# Patient Record
Sex: Male | Born: 1950 | Race: White | Hispanic: No | Marital: Married | Smoking: Never smoker
Health system: Southern US, Community
[De-identification: ages and names within clinical notes are randomized; demographics above are authoritative.]

## PROBLEM LIST (undated history)

## (undated) DIAGNOSIS — E785 Hyperlipidemia, unspecified: Secondary | ICD-10-CM

## (undated) DIAGNOSIS — G373 Acute transverse myelitis in demyelinating disease of central nervous system: Secondary | ICD-10-CM

## (undated) DIAGNOSIS — K219 Gastro-esophageal reflux disease without esophagitis: Secondary | ICD-10-CM

## (undated) DIAGNOSIS — F419 Anxiety disorder, unspecified: Secondary | ICD-10-CM

## (undated) DIAGNOSIS — Z87898 Personal history of other specified conditions: Secondary | ICD-10-CM

## (undated) DIAGNOSIS — E119 Type 2 diabetes mellitus without complications: Secondary | ICD-10-CM

## (undated) DIAGNOSIS — E079 Disorder of thyroid, unspecified: Secondary | ICD-10-CM

## (undated) DIAGNOSIS — C801 Malignant (primary) neoplasm, unspecified: Secondary | ICD-10-CM

## (undated) DIAGNOSIS — E349 Endocrine disorder, unspecified: Secondary | ICD-10-CM

## (undated) DIAGNOSIS — R011 Cardiac murmur, unspecified: Secondary | ICD-10-CM

## (undated) DIAGNOSIS — B029 Zoster without complications: Secondary | ICD-10-CM

## (undated) DIAGNOSIS — T7840XA Allergy, unspecified, initial encounter: Secondary | ICD-10-CM

## (undated) HISTORY — DX: Type 2 diabetes mellitus without complications: E11.9

## (undated) HISTORY — DX: Malignant (primary) neoplasm, unspecified: C80.1

## (undated) HISTORY — DX: Gilbert syndrome: E80.4

## (undated) HISTORY — DX: Hyperlipidemia, unspecified: E78.5

## (undated) HISTORY — PX: ROTATOR CUFF REPAIR: SHX139

## (undated) HISTORY — DX: Endocrine disorder, unspecified: E34.9

## (undated) HISTORY — PX: SHOULDER ARTHROSCOPY: SHX128

## (undated) HISTORY — PX: COLONOSCOPY: SHX174

## (undated) HISTORY — DX: Gastro-esophageal reflux disease without esophagitis: K21.9

## (undated) HISTORY — PX: APPENDECTOMY: SHX54

## (undated) HISTORY — DX: Allergy, unspecified, initial encounter: T78.40XA

## (undated) HISTORY — PX: EYE SURGERY: SHX253

## (undated) HISTORY — DX: Disorder of thyroid, unspecified: E07.9

## (undated) HISTORY — DX: Acute transverse myelitis in demyelinating disease of central nervous system: G37.3

## (undated) HISTORY — PX: KNEE ARTHROSCOPY: SUR90

## (undated) HISTORY — DX: Zoster without complications: B02.9

## (undated) HISTORY — DX: Personal history of other specified conditions: Z87.898

## (undated) HISTORY — PX: POLYPECTOMY: SHX149

## (undated) HISTORY — PX: NOSE SURGERY: SHX723

---

## 2004-06-07 ENCOUNTER — Ambulatory Visit: Payer: Self-pay | Admitting: Internal Medicine

## 2005-02-28 ENCOUNTER — Ambulatory Visit: Payer: Self-pay | Admitting: Internal Medicine

## 2005-12-22 ENCOUNTER — Ambulatory Visit: Payer: Self-pay | Admitting: Internal Medicine

## 2007-06-19 ENCOUNTER — Ambulatory Visit: Payer: Self-pay | Admitting: Internal Medicine

## 2007-10-26 ENCOUNTER — Ambulatory Visit: Payer: Self-pay | Admitting: Internal Medicine

## 2007-10-26 DIAGNOSIS — N419 Inflammatory disease of prostate, unspecified: Secondary | ICD-10-CM | POA: Insufficient documentation

## 2007-10-26 DIAGNOSIS — E785 Hyperlipidemia, unspecified: Secondary | ICD-10-CM | POA: Insufficient documentation

## 2007-10-29 ENCOUNTER — Encounter (INDEPENDENT_AMBULATORY_CARE_PROVIDER_SITE_OTHER): Payer: Self-pay | Admitting: *Deleted

## 2007-10-29 ENCOUNTER — Encounter: Payer: Self-pay | Admitting: Internal Medicine

## 2007-11-05 ENCOUNTER — Encounter (INDEPENDENT_AMBULATORY_CARE_PROVIDER_SITE_OTHER): Payer: Self-pay | Admitting: *Deleted

## 2007-11-13 ENCOUNTER — Encounter: Payer: Self-pay | Admitting: Internal Medicine

## 2007-11-26 ENCOUNTER — Ambulatory Visit: Payer: Self-pay | Admitting: Internal Medicine

## 2007-11-26 ENCOUNTER — Encounter: Payer: Self-pay | Admitting: Internal Medicine

## 2007-11-26 ENCOUNTER — Emergency Department (HOSPITAL_COMMUNITY): Admission: EM | Admit: 2007-11-26 | Discharge: 2007-11-26 | Payer: Self-pay | Admitting: Emergency Medicine

## 2007-11-26 DIAGNOSIS — K8020 Calculus of gallbladder without cholecystitis without obstruction: Secondary | ICD-10-CM | POA: Insufficient documentation

## 2007-12-03 ENCOUNTER — Ambulatory Visit: Payer: Self-pay | Admitting: Internal Medicine

## 2007-12-05 ENCOUNTER — Telehealth: Payer: Self-pay | Admitting: Internal Medicine

## 2007-12-19 ENCOUNTER — Ambulatory Visit: Payer: Self-pay | Admitting: Internal Medicine

## 2007-12-19 ENCOUNTER — Encounter (INDEPENDENT_AMBULATORY_CARE_PROVIDER_SITE_OTHER): Payer: Self-pay | Admitting: *Deleted

## 2008-01-01 ENCOUNTER — Encounter: Payer: Self-pay | Admitting: Internal Medicine

## 2008-04-01 ENCOUNTER — Telehealth (INDEPENDENT_AMBULATORY_CARE_PROVIDER_SITE_OTHER): Payer: Self-pay | Admitting: *Deleted

## 2008-06-24 ENCOUNTER — Encounter: Payer: Self-pay | Admitting: Internal Medicine

## 2008-07-25 HISTORY — PX: CHOLECYSTECTOMY: SHX55

## 2008-07-29 ENCOUNTER — Ambulatory Visit (HOSPITAL_COMMUNITY): Admission: RE | Admit: 2008-07-29 | Discharge: 2008-07-29 | Payer: Self-pay | Admitting: General Surgery

## 2008-07-29 ENCOUNTER — Encounter (INDEPENDENT_AMBULATORY_CARE_PROVIDER_SITE_OTHER): Payer: Self-pay | Admitting: General Surgery

## 2008-08-19 ENCOUNTER — Encounter: Payer: Self-pay | Admitting: Internal Medicine

## 2008-10-28 ENCOUNTER — Encounter: Payer: Self-pay | Admitting: Internal Medicine

## 2008-10-29 ENCOUNTER — Ambulatory Visit: Payer: Self-pay | Admitting: Family Medicine

## 2008-10-30 ENCOUNTER — Encounter: Payer: Self-pay | Admitting: Internal Medicine

## 2008-11-21 ENCOUNTER — Encounter (INDEPENDENT_AMBULATORY_CARE_PROVIDER_SITE_OTHER): Payer: Self-pay | Admitting: *Deleted

## 2009-06-07 ENCOUNTER — Ambulatory Visit: Payer: Self-pay | Admitting: Family Medicine

## 2009-06-23 ENCOUNTER — Encounter (INDEPENDENT_AMBULATORY_CARE_PROVIDER_SITE_OTHER): Payer: Self-pay

## 2009-06-24 ENCOUNTER — Ambulatory Visit: Payer: Self-pay | Admitting: Internal Medicine

## 2009-07-06 ENCOUNTER — Ambulatory Visit: Payer: Self-pay | Admitting: Internal Medicine

## 2009-07-06 LAB — HM COLONOSCOPY

## 2009-07-08 ENCOUNTER — Encounter: Payer: Self-pay | Admitting: Internal Medicine

## 2009-10-06 ENCOUNTER — Telehealth: Payer: Self-pay | Admitting: Internal Medicine

## 2009-11-16 ENCOUNTER — Encounter: Payer: Self-pay | Admitting: Internal Medicine

## 2010-01-12 ENCOUNTER — Ambulatory Visit: Payer: Self-pay | Admitting: Internal Medicine

## 2010-01-12 DIAGNOSIS — B351 Tinea unguium: Secondary | ICD-10-CM | POA: Insufficient documentation

## 2010-04-05 ENCOUNTER — Ambulatory Visit: Payer: Self-pay | Admitting: Internal Medicine

## 2010-04-05 DIAGNOSIS — G4700 Insomnia, unspecified: Secondary | ICD-10-CM | POA: Insufficient documentation

## 2010-04-05 DIAGNOSIS — F411 Generalized anxiety disorder: Secondary | ICD-10-CM | POA: Insufficient documentation

## 2010-05-25 ENCOUNTER — Ambulatory Visit: Payer: Self-pay | Admitting: Internal Medicine

## 2010-05-25 DIAGNOSIS — H9319 Tinnitus, unspecified ear: Secondary | ICD-10-CM | POA: Insufficient documentation

## 2010-05-28 ENCOUNTER — Emergency Department (HOSPITAL_BASED_OUTPATIENT_CLINIC_OR_DEPARTMENT_OTHER): Admission: EM | Admit: 2010-05-28 | Discharge: 2010-05-28 | Payer: Self-pay | Admitting: Emergency Medicine

## 2010-05-28 ENCOUNTER — Ambulatory Visit: Payer: Self-pay | Admitting: Interventional Radiology

## 2010-06-01 ENCOUNTER — Encounter: Payer: Self-pay | Admitting: Internal Medicine

## 2010-06-09 ENCOUNTER — Encounter: Payer: Self-pay | Admitting: Internal Medicine

## 2010-08-22 LAB — CONVERTED CEMR LAB
ALT: 36 units/L (ref 0–53)
AST: 29 units/L (ref 0–37)
Basophils Absolute: 0 10*3/uL (ref 0.0–0.1)
Basophils Relative: 0.3 % (ref 0.0–1.0)
Bilirubin, Direct: 0.1 mg/dL (ref 0.0–0.3)
CO2: 32 meq/L (ref 19–32)
Chloride: 106 meq/L (ref 96–112)
Glucose, Bld: 112 mg/dL — ABNORMAL HIGH (ref 70–99)
Hemoglobin: 16.3 g/dL (ref 13.0–17.0)
Lymphocytes Relative: 32.2 % (ref 12.0–46.0)
MCHC: 33 g/dL (ref 30.0–36.0)
Monocytes Relative: 8 % (ref 3.0–12.0)
Neutro Abs: 3.3 10*3/uL (ref 1.4–7.7)
Neutrophils Relative %: 57.7 % (ref 43.0–77.0)
RBC: 5.33 M/uL (ref 4.22–5.81)
Sodium: 141 meq/L (ref 135–145)
Total Bilirubin: 1.2 mg/dL (ref 0.3–1.2)
Total Protein: 7.3 g/dL (ref 6.0–8.3)

## 2010-08-26 NOTE — Letter (Signed)
Summary: [facility]  [facility]   Imported By: Lanelle Bal 06/14/2010 10:36:04  _____________________________________________________________________  External Attachment:    Type:   Image     Comment:   External Document

## 2010-08-26 NOTE — Assessment & Plan Note (Signed)
Summary: TROUBLE SLEEPING/KN   Vital Signs:  Patient profile:   60 year old male Weight:      242.2 pounds Temp:     98.2 degrees F oral Pulse rate:   60 / minute Resp:     15 per minute BP sitting:   128 / 80  (left arm) Cuff size:   large  Vitals Entered By: Shonna Chock CMA (April 05, 2010 3:04 PM) CC: Trouble sleeping: Staying and Falling x several months, Insomnia   Primary Care Provider:  Marga Melnick, MD  CC:  Trouble sleeping: Staying and Falling x several months and Insomnia.  History of Present Illness: Insomnia      This is a 60 year old man who presents with Insomnia for 3 months w/o trigger.  The patient reports intermittent difficulty falling asleep, frequent awakening, early awakening, and daytime somnolence, but denies nightmares, leg movements, snoring, and apnea noted by partner. "I can't shut my mind down". Similar issues several years ago due to family stresses. Insomnia has led to problems with falling asleep at work.  Risk factors for insomnia include irregular work schedule; he works 1st & 2nd shift in [employer]. He must get up @ 4:30 am for 1st shift. Sleep is less of an issue on 2nd shift. Behaviors that may contribute to insomnia include reading in bed and OTC sleep aids (Benadryl) .  Treatments tried in the past and found to be ineffective incude Homeopathy. FH of anxiety in his mother, father & sister.His brother is an alcoholic.  Allergies: 1)  ! Codeine 2)  Codeine  Review of Systems General:  Complains of fatigue. Eyes:  Denies blurring, double vision, and vision loss-both eyes. CV:  Occasional palpitations. GI:  Denies constipation and diarrhea. Derm:  Denies changes in nail beds, dryness, and hair loss. Neuro:  Denies numbness and tingling. Psych:  Complains of anxiety; denies easily angered, easily tearful, irritability, and panic attacks. Endo:  Denies cold intolerance and heat intolerance.  Physical Exam  General:  well-nourished,in no  acute distress; alert,appropriate and cooperative throughout examination Eyes:  No corneal or conjunctival inflammation noted.Perrla. No lid lag Mouth:  Oral mucosa and oropharynx without lesions or exudates.  Teeth in good repair. Oropharynx not crowded Neck:  No deformities, masses, or tenderness noted. Heart:  regular rhythm, no murmur, no gallop, no rub, and bradycardia.   Extremities:  No clubbing, cyanosis, edema. No onycholysis . No tremor Neurologic:  alert & oriented X3 and DTRs symmetrical and normal.   No tremor Cervical Nodes:  No lymphadenopathy noted Axillary Nodes:  No palpable lymphadenopathy Psych:  memory intact for recent and remote, normally interactive, good eye contact, not anxious appearing, and not depressed appearing.     Impression & Recommendations:  Problem # 1:  INSOMNIA-SLEEP DISORDER-UNSPEC (ICD-780.52)  Problem # 2:  ANXIETY STATE, UNSPECIFIED (ICD-300.00) Pathophysiology of Neurotransmitter Deficiency discussed via . His updated medication list for this problem includes:    Doxepin Hcl 10 Mg Caps (Doxepin hcl) .Marland Kitchen... 1 at bedtime as needed  Complete Medication List: 1)  Benadryl 25 Mg Caps (Diphenhydramine hcl) .... As needed 2)  Multivitamins Caps (Multiple vitamin) .Marland Kitchen.. 1 once daily 3)  Vitamin C 250 Mg Tabs (Ascorbic acid) .... Once daily 4)  B Complex Caps (B complex vitamins) .Marland Kitchen.. 1 once daily 5)  Calcium 500/d 500-200 Mg-unit Tabs (Calcium carbonate-vitamin d) .... Once daily 6)  Apexicon 0.05 % Oint (Diflorasone diacetate) .... As directed 7)  Testim 50 Mg/5gm Gel (Testosterone) .Marland KitchenMarland KitchenMarland Kitchen  Once daily 8)  Doxepin Hcl 10 Mg Caps (Doxepin hcl) .Marland Kitchen.. 1 at bedtime as needed  Patient Instructions: 1)  Assess response to as needed at bedtime medication. Avoid  stimulants & Sleep Hygiene as discussed. Prescriptions: DOXEPIN HCL 10 MG CAPS (DOXEPIN HCL) 1 at bedtime as needed  #30 x 2   Entered and Authorized by:   Marga Melnick MD   Signed by:   Marga Melnick MD on 04/05/2010   Method used:   Print then Give to Patient   RxID:   [id number]

## 2010-08-26 NOTE — Letter (Signed)
Summary: [facility]  [facility]   Imported By: Lanelle Bal 11/23/2009 12:12:07  _____________________________________________________________________  External Attachment:    Type:   Image     Comment:   External Document

## 2010-08-26 NOTE — Progress Notes (Signed)
Summary: colon results  Phone Note Call from Patient Call back at Home Phone [phone]   Caller: Patient Call For: Leone Payor Reason for Call: Talk to Nurse Summary of Call: Patient states that he never received the colon results can we please resend it Initial call taken by: Tawni Levy,  October 06, 2009 2:11 PM  Follow-up for Phone Call        letter mailed again. Patient  aware that I have mailed  a copy of the letter and I reviewed the results with him. Follow-up by: Darcey Nora RN, CGRN,  October 06, 2009 2:24 PM

## 2010-08-26 NOTE — Assessment & Plan Note (Signed)
Summary: FU ON SKIN INFECTION //KN   Vital Signs:  Patient profile:   60 year old male Height:      75 inches Weight:      238 pounds BMI:     29.86 BSA:     2.36 Temp:     98.2 degrees F oral Pulse rate:   58 / minute Pulse rhythm:   regular Resp:     16 per minute BP sitting:   142 / 86  (left arm) Cuff size:   regular  Vitals Entered By: Ok Anis CMA (January 12, 2010 2:34 PM)  Primary Care Provider:  Marga Melnick, MD  CC:  F/u skin infection.  History of Present Illness: Onset 01/07/2010 as redness in R foot. Ringworm dxed @ UC ; Topical & pill Rxed. Sat 06/18 he had fever & sweats with  swelling over R foot @ level of malleoli with bright red rash. He saw Dr Yetta Barre 06/20 ;? bug bite dxed & Doxycycline & Pen VK & Apexicon topically. He works as Administrator. No PMH of MRSA  Current Medications (verified): 1)  Benadryl 25 Mg  Caps (Diphenhydramine Hcl) .... As Needed 2)  Multivitamins   Caps (Multiple Vitamin) .Marland Kitchen.. 1 Once Daily 3)  Vitamin C 250 Mg  Tabs (Ascorbic Acid) .... Once Daily 4)  B Complex   Caps (B Complex Vitamins) .Marland Kitchen.. 1 Once Daily 5)  Calcium 500/d 500-200 Mg-Unit  Tabs (Calcium Carbonate-Vitamin D) .... Once Daily 6)  Penicillin V Potassium 500 Mg Tabs (Penicillin V Potassium) .... One Tablet By Mouth Three Times A Day X 14 Days 7)  Doxycycline Hyclate 100 Mg Tabs (Doxycycline Hyclate) .... One Tablet Two Times A Day 8)  Apexicon 0.05 % Oint (Diflorasone Diacetate) .... As Directed  Allergies (verified): 1)  ! Codeine 2)  Codeine  Review of Systems General:  Denies chills, fever, and sweats. Derm:  Complains of changes in color of skin; denies itching.  Physical Exam  General:  well-nourished,in no acute distress; alert,appropriate and cooperative throughout examination Pulses:  R femoral,dorsalis pedis and posterior tibial pulses are full and equal bilaterally Extremities:  No clubbing, cyanosis, edema. Mild fungal nail changes  Skin:  7.5 x 5 cm  deep violaceous lesion R Achilles tendon area w/o increased temp or tenderness; localized swlling Inguinal Nodes:  No significant adenopathy   Impression & Recommendations:  Problem # 1:  CELLULITIS (ICD-682.9) ? from bugbite or tick The following medications were removed from the medication list:    Azithromycin 250 Mg Tabs (Azithromycin) .Marland Kitchen..Marland Kitchen Two tabs by mouth on day 1, then 1 tab daily on days 2 through 5 His updated medication list for this problem includes:    Penicillin V Potassium 500 Mg Tabs (Penicillin v potassium) ..... One tablet by mouth three times a day x 14 days    Doxycycline Hyclate 100 Mg Tabs (Doxycycline hyclate) ..... One tablet two times a day  Problem # 2:  ONYCHOMYCOSIS, TOENAILS (ICD-110.1)  Complete Medication List: 1)  Benadryl 25 Mg Caps (Diphenhydramine hcl) .... As needed 2)  Multivitamins Caps (Multiple vitamin) .Marland Kitchen.. 1 once daily 3)  Vitamin C 250 Mg Tabs (Ascorbic acid) .... Once daily 4)  B Complex Caps (B complex vitamins) .Marland Kitchen.. 1 once daily 5)  Calcium 500/d 500-200 Mg-unit Tabs (Calcium carbonate-vitamin d) .... Once daily 6)  Penicillin V Potassium 500 Mg Tabs (Penicillin v potassium) .... One tablet by mouth three times a day x 14 days 7)  Doxycycline Hyclate 100  Mg Tabs (Doxycycline hyclate) .... One tablet two times a day 8)  Apexicon 0.05 % Oint (Diflorasone diacetate) .... As directed  Patient Instructions: 1)  Finish full course of antibiotics; avoid direct sun. Flip flops @  gym. Report Warning Signs as discussed. Saline wet to dry three times a day to rash. Align once daily for loose stool

## 2010-08-26 NOTE — Therapy (Signed)
[facility]   Imported By: Lanelle Bal 06/21/2010 13:53:05  _____________________________________________________________________  External Attachment:    Type:   Image     Comment:   External Document

## 2010-08-26 NOTE — Assessment & Plan Note (Signed)
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  History of Present Illness: Tinnitus chronically for 5-6 years; progressive in past 1 year. PMH of temporary worsening with cerumen impactions in HS. He protects ears from excess noise; he is on 81 mg ASA daily.He swimms 3-4 X /week ; he does not use plugs. Slight hearing loss 5 yrs ago on exit CPX from [place].  Current Medications (verified): 1)  Benadryl 25 Mg  Caps (Diphenhydramine Hcl) .... As Needed 2)  Multivitamins   Caps (Multiple Vitamin) .Marland Kitchen.. 1 Once Daily 3)  Vitamin C 250 Mg  Tabs (Ascorbic Acid) .... Once Daily 4)  B Complex   Caps (B Complex Vitamins) .Marland Kitchen.. 1 Once Daily 5)  Calcium 500/d 500-200 Mg-Unit  Tabs (Calcium Carbonate-Vitamin D) .... Once Daily 6)  Apexicon 0.05 % Oint (Diflorasone Diacetate) .... As Directed 7)  Testim 50 Mg/5gm Gel (Testosterone) .... Once Daily 8)  Doxepin Hcl 10 Mg Caps (Doxepin Hcl) .Marland Kitchen.. 1 At Bedtime As Needed  Allergies: 1)  ! Codeine 2)  Codeine  Review of Systems General:  Denies chills, fever, and sweats. ENT:  Complains of decreased hearing and earache; denies ear discharge and nasal congestion; No URI symptoms . Neuro:  Denies headaches, numbness, and tingling.  Physical Exam  General:  well-nourished,in no acute distress; alert,appropriate and cooperative throughout examination Ears:  External ear exam shows no significant lesions or deformities.  Otoscopic examination reveals clear canals, tympanic membranes are intact bilaterally  without bulging, retraction, inflammation or discharge. Hearing is grossly normal bilaterally.  Tuning fork exam normal.MINIMAL wax on R. Whisper heard @ 6 ft. Nose:  External nasal examination shows no deformity or inflammation. Nasal mucosa are pink and moist without lesions or exudates. Mouth:  Oral mucosa and oropharynx without lesions or exudates.  Teeth in good repair. Cervical Nodes:  No lymphadenopathy noted Axillary Nodes:  No palpable lymphadenopathy   Impression & Recommendations:  Problem # 1:  TINNITUS (ICD-388.30)  Orders: Audiology (Audio)  Complete Medication List: 1)  Benadryl 25 Mg Caps (Diphenhydramine hcl) .... As needed 2)  Multivitamins Caps (Multiple vitamin) .Marland Kitchen.. 1 once daily 3)  Vitamin C 250 Mg Tabs (Ascorbic acid) .... Once daily 4)  B Complex Caps (B complex vitamins) .Marland Kitchen.. 1 once daily 5)  Calcium 500/d 500-200 Mg-unit Tabs (Calcium carbonate-vitamin d) .... Once daily 6)  Apexicon 0.05 % Oint (Diflorasone diacetate) .... As directed 7)  Testim 50 Mg/5gm Gel (Testosterone) .... Once daily 8)  Doxepin Hcl 10 Mg Caps (Doxepin hcl) .Marland Kitchen.. 1 at bedtime as needed  Patient Instructions: 1)  Audiology referral will be made.   Orders Added: 1)  Est. Patient Level III [16109] 2)  Audiology [Audio]

## 2010-10-05 LAB — CBC
HCT: 47.4 % (ref 39.0–52.0)
Hemoglobin: 16.6 g/dL (ref 13.0–17.0)
MCV: 90 fL (ref 78.0–100.0)
Platelets: 155 10*3/uL (ref 150–400)
RBC: 5.27 MIL/uL (ref 4.22–5.81)
WBC: 6.5 10*3/uL (ref 4.0–10.5)

## 2010-10-05 LAB — DIFFERENTIAL
Eosinophils Relative: 1 % (ref 0–5)
Lymphocytes Relative: 24 % (ref 12–46)
Lymphs Abs: 1.6 10*3/uL (ref 0.7–4.0)
Monocytes Relative: 7 % (ref 3–12)

## 2010-10-05 LAB — BASIC METABOLIC PANEL
Chloride: 103 mEq/L (ref 96–112)
Creatinine, Ser: 0.9 mg/dL (ref 0.4–1.5)
GFR calc Af Amer: >60 mL/min (ref >60)
Potassium: 4.3 mEq/L (ref 3.5–5.1)
Sodium: 143 mEq/L (ref 135–145)

## 2010-10-05 LAB — URINALYSIS, ROUTINE W REFLEX MICROSCOPIC
Glucose, UA: NEGATIVE mg/dL
Hgb urine dipstick: NEGATIVE
Specific Gravity, Urine: 1.014 (ref 1.005–1.030)
pH: 5.5 (ref 5.0–8.0)

## 2010-11-09 ENCOUNTER — Telehealth: Payer: Self-pay | Admitting: Internal Medicine

## 2010-11-09 NOTE — Telephone Encounter (Signed)
Patient has CPX on 6/29, has labs for 6/22---what orders and codes do you want to use??    thanks

## 2010-11-09 NOTE — Telephone Encounter (Signed)
Lipid,Hep, BMP,CBCD, TSH,PSA,Stool Cards,Udip v70.0/601.9/272.4

## 2010-11-09 NOTE — Telephone Encounter (Signed)
Added laab info to 6/22

## 2010-11-24 ENCOUNTER — Ambulatory Visit (INDEPENDENT_AMBULATORY_CARE_PROVIDER_SITE_OTHER): Payer: 59 | Admitting: Internal Medicine

## 2010-11-24 ENCOUNTER — Encounter: Payer: Self-pay | Admitting: Internal Medicine

## 2010-11-24 DIAGNOSIS — R030 Elevated blood-pressure reading, without diagnosis of hypertension: Secondary | ICD-10-CM

## 2010-11-24 DIAGNOSIS — R040 Epistaxis: Secondary | ICD-10-CM

## 2010-11-24 DIAGNOSIS — F411 Generalized anxiety disorder: Secondary | ICD-10-CM

## 2010-11-24 DIAGNOSIS — F519 Sleep disorder not due to a substance or known physiological condition, unspecified: Secondary | ICD-10-CM

## 2010-11-24 MED ORDER — FLUOXETINE HCL 20 MG PO CAPS: 20.0000 mg | ORAL_CAPSULE | Freq: Every day | ORAL | Status: AC

## 2010-11-24 MED ORDER — LISINOPRIL 20 MG PO TABS: 20.0000 mg | ORAL_TABLET | Freq: Every day | ORAL | Status: DC

## 2010-11-24 NOTE — Progress Notes (Signed)
  Subjective:    Patient ID: Aaron Bradshaw, male    DOB: [birth year], 60 y.o.   MRN: [id number]  HPI He actually has several concerns. He had epistaxis from the left nare yesterday. The EMS recorded his blood pressure @ 168/88. He denies any other bleeding dyscrasias such as hemoptysis, melena, rectal bleeding, hematuria, or bleeding or clotting issues. He has been using the agent "Snore Stop" intranasally & questions whether this caused the epistaxis.  He has been having a flare of his chronic insomnia over  the last 3 weeks. He denies the use of stimulants or decongestants. He has been using Benadryl with some response. In the past Doxepin  was also beneficial  He feels his anxiety has increased in the last several weeks as well without any specific trigger. He states he has had panic attacks in the past. This time he denies panic attacks, irritability or severe depression. His father & mother  did have depression.      Review of Systems The major and minor symptoms of rhinosinusitis were absent: nasal congestion/obstruction; nasal purulence; facial pain; anosmia; fatigue; fever; headache; halitosis; earache and dental pain.    Objective:   Physical Exam Gen.: Healthy and well-nourished in appearance. Alert, appropriate and cooperative throughout exam. Eyes: No corneal or conjunctival inflammation noted. Ears: External  ear exam reveals no significant lesions or deformities. Canals clear .TMs normal. Nose: External nasal exam reveals no deformity or inflammation. Nasal mucosa are  dry. No lesions or exudates noted.  Mouth: Oral mucosa and oropharynx reveal no lesions or exudates. Teeth in good repair. Neck: No deformities, masses, or tenderness noted. . Thyroid  normal. Lungs: Normal respiratory effort; chest expands symmetrically. Lungs are clear to auscultation without rales, wheezes, or increased work of breathing. Heart: Normal rate and rhythm. Normal S1 and S2. No gallop, click, or  rub. No murmur. Musculoskeletal/extremities: No clubbing, cyanosis, edema, or deformity noted.  Nail health  Good w/o onycholysis. Vascular: Carotid, radial artery, dorsalis pedis and dorsalis posterior tibial pulses are full and equal. No bruits present. Neurologic: Alert and oriented x3. Deep tendon reflexes symmetrical and normal. No tremor. Skin: Intact without suspicious lesions or rashes. Lymph: No cervical, axillary  lymphadenopathy present. Psych: Mood and affect are slightly flat but he is open &  interactive                                                                                         Assessment & Plan:  #1 epistaxis   #2 hypertension   #3 sleep disorder, exacerbation of  chronic issue   #4 anxiety    Plan : TSH will be ordered; clinically hyperthyroidism is not present  The doxepin will be used as needed at night. Sleep hygiene was discussed  Fluoxetine 20 mg was prescribed; the pathophysiology of serotonin deficiency was discussed  Blood pressure will be monitored; if it averages over 135/85, lisinopril 20 mg daily will be initiated

## 2010-11-24 NOTE — Patient Instructions (Addendum)
Fill the prescription for the lisinopril if your blood pressure averages greater than 135/85. Use the Doxepin as needed @ night  if Fluoxetine does not control symptoms.

## 2010-12-02 ENCOUNTER — Telehealth: Payer: Self-pay | Admitting: *Deleted

## 2010-12-02 NOTE — Telephone Encounter (Signed)
Spoke w/ pt mentioned same symptoms as below would like to know what to do.

## 2010-12-02 NOTE — Telephone Encounter (Signed)
The fluoxetine can be stopped; please call if the symptoms fail to resolve off the fluoxetine. If her symptoms persisted I would want to collect 24-hour urine for catecholamines and metanephrines, the stress hormones.

## 2010-12-02 NOTE — Telephone Encounter (Signed)
[facility] Triage Call Report Triage Record Num: [id number] Operator: Ethlyn Gallery Patient Name: Aaron Bradshaw Call Date & Time: 12/01/2010 10:11:33PM Patient Phone: [phone] PCP: Marga Melnick Patient Gender: Male PCP Fax : [phone] Patient DOB: 1951/05/20 Practice Name: [facility] Reason for Call: [name]/pt called and stated he has been on Fuloxetine 20mg  po qd since 11/27/02/12. He states he is experiencing restless, increased agitation, thirst, frequency of urination, feeling hot/cold, difficulty sleeping, and tightness inhis chest. He is asking if he should stop taking the mediction. Advised him to talk with his PCP before stopping medication and that he should not stop it suddenly without talking to his doctor. Advised him to take Doxepin as prescribed. Protocol(s) Used: Office Note Recommended Outcome per Protocol: Information Noted and Sent to Office Reason for Outcome: Caller information to office

## 2010-12-02 NOTE — Telephone Encounter (Signed)
Left detailed msg on pt voicemail w/ information.

## 2010-12-07 NOTE — Op Note (Signed)
NAME:  Aaron Bradshaw, Aaron Bradshaw           ACCOUNT NO.:  [id number]   MEDICAL RECORD NO.:  [id number]          PATIENT TYPE:  AMB   LOCATION:  SDS                          FACILITY:  [facility]   PHYSICIAN:  Cherylynn Ridges, M.D.    DATE OF BIRTH:  Nov 04, 1950   DATE OF PROCEDURE:  07/29/2008  DATE OF DISCHARGE:  07/29/2008                               OPERATIVE REPORT   Patient of Dr. Alwyn Ren.   PREOPERATIVE DIAGNOSIS:  Symptomatic cholelithiasis.   POSTOPERATIVE DIAGNOSIS:  Symptomatic cholelithiasis.   PROCEDURE:  Laparoscopic cholecystectomy with cholangiogram.   SURGEON:  Marta Lamas. Lindie Spruce, MD   ANESTHESIA:  General endotracheal.   ESTIMATED BLOOD LOSS:  Less than 20 mL.   COMPLICATIONS:  None.   CONDITION:  Stable.   FINDINGS:  No acute inflammation.  Normal cholangiogram with good flow  into the duodenum.  No ductal dilatation.  No intraductal filling  defects.  Good proximal filling.   INDICATIONS FOR OPERATION:  The patient is a 60 year old with  symptomatic gallstones who comes in now for an elective laparoscopic  cholecystectomy.   OPERATION:  The patient was taken to the operating room, placed on the  table in supine position.  After an adequate general endotracheal  anesthetic was administered, he was prepped and draped in the usual  sterile manner exposing in midline and right upper quadrant.   A supraumbilical midline incision was made approximately 1.5 cm long  down to the subcutaneous tissue with a #15 blade.  At the midline  fascia, an incision was made in the fascia with a #15 blade and we  grabbed the edges of the fascia with Kocher clamps while tenting up on  the fascia bluntly dissected down into the peritoneal cavity with a  Kelly clamp.  Once this was done, we were able to enter the peritoneal  cavity bluntly.  We passed a pursestring suture of 0 Vicryl around the  fascial opening then passed a Hasson cannula into the peritoneal cavity  with minimal difficulty.   We secured in place with the pursestring  suture, which was in place and then insufflated carbon dioxide gas up to  a maximal intra-abdominal pressure of 50 mmHg.   Two right costal margin 5-mm cannulas and a subxiphoid 11/12-mm cannula  passed under direct vision.  The patient was placed in reverse  Trendelenburg and the left side was tilted down then we began the  dissection.   We grabbed the dome of the gallbladder with a ratcheted grasper through  the lateral-most 5-mm cannula and then a second one was placed on the  infundibulum.  We dissected out the peritoneum overlying the triangle of  Calot and hepatoduodenal triangle, isolating both the cystic duct and  the cystic artery.  The cystic artery was doubly clipped proximally and  distally and then transected.  A clip was placed on the gallbladder side  and cystic duct that cholecystoducotomy was made through which a Cook  catheter which had been passed through the anterior abdominal wall was  passed in order to perform a cholangiogram.  A cholangiogram showed good  flow into the  duodenum, no intraductal filling defects, no ductal  dilatation, good proximal filling.   Once cholangiogram was completed, we removed the clip securing the  catheter in place, endoclipped the distal cystic duct x3, then  transected the cystic duct.  We then dissected out the gallbladder from  its bed actually puncturing the gallbladder once or twice, spilling  clear bile.  We irrigated all this out with a liter of saline.   We dissected out the gallbladder from its bed with minimal difficulty.  Then, we removed it from the supraumbilical site using an EndoCatch bag.  All needle counts, sponge counts, and instrument counts were correct at  that point.  We irrigated and aspirated.  Electrocautery was used to  obtain hemostasis.  We aspirated all fluid and gas from the peritoneal  cavity as all cannulas were removed.   The supraumbilical fascial site was  closed using a pursestring suture,  which was in place.  We injected 0.25% Marcaine with epi at all sites,  and we closed supraumbilical and subxiphoid skin sites using running  subcuticular stitch of 4-0 Monocryl.  The lateral cannula site was  closed with Dermabond only.  Dermabond, Steri-Strips, and Tegaderm was  used to complete the dressing.  All needle counts, sponge counts, and  instrument counts were correct.      Cherylynn Ridges, M.D.  Electronically Signed     JOW/MEDQ  D:  07/29/2008  T:  07/29/2008  Job:  [id number]   cc:   Dr. Alwyn Ren.

## 2010-12-24 DIAGNOSIS — E119 Type 2 diabetes mellitus without complications: Secondary | ICD-10-CM

## 2010-12-24 HISTORY — DX: Type 2 diabetes mellitus without complications: E11.9

## 2011-01-13 ENCOUNTER — Other Ambulatory Visit: Payer: Self-pay | Admitting: Internal Medicine

## 2011-01-13 DIAGNOSIS — Z0000 Encounter for general adult medical examination without abnormal findings: Secondary | ICD-10-CM

## 2011-01-14 ENCOUNTER — Other Ambulatory Visit (INDEPENDENT_AMBULATORY_CARE_PROVIDER_SITE_OTHER): Payer: 59

## 2011-01-14 DIAGNOSIS — Z0000 Encounter for general adult medical examination without abnormal findings: Secondary | ICD-10-CM

## 2011-01-14 LAB — BASIC METABOLIC PANEL
CO2: 29 mEq/L (ref 19–32)
Calcium: 9 mg/dL (ref 8.4–10.5)
GFR: 88.32 mL/min (ref >60.00)
Sodium: 135 mEq/L (ref 135–145)

## 2011-01-14 LAB — CBC WITH DIFFERENTIAL/PLATELET
Basophils Absolute: 0 K/uL (ref 0.0–0.1)
Basophils Relative: 0.6 % (ref 0.0–3.0)
Eosinophils Absolute: 0.2 K/uL (ref 0.0–0.7)
Eosinophils Relative: 2.7 % (ref 0.0–5.0)
HCT: 46.8 % (ref 39.0–52.0)
Hemoglobin: 16.2 g/dL (ref 13.0–17.0)
Lymphocytes Relative: 29.6 % (ref 12.0–46.0)
Lymphs Abs: 1.8 K/uL (ref 0.7–4.0)
MCHC: 34.7 g/dL (ref 30.0–36.0)
MCV: 92.4 fl (ref 78.0–100.0)
Monocytes Absolute: 0.6 K/uL (ref 0.1–1.0)
Monocytes Relative: 9.2 % (ref 3.0–12.0)
Neutro Abs: 3.5 K/uL (ref 1.4–7.7)
Neutrophils Relative %: 57.9 % (ref 43.0–77.0)
Platelets: 168 K/uL (ref 150.0–400.0)
RBC: 5.06 Mil/uL (ref 4.22–5.81)
RDW: 12.8 % (ref 11.5–14.6)
WBC: 6 K/uL (ref 4.5–10.5)

## 2011-01-14 LAB — POCT URINALYSIS DIPSTICK
Ketones, UA: NEGATIVE
Protein, UA: NEGATIVE
Spec Grav, UA: 1.01
Urobilinogen, UA: 0.2
pH, UA: 5

## 2011-01-14 LAB — LIPID PANEL
HDL: 33.5 mg/dL — ABNORMAL LOW (ref >39.00)
Total CHOL/HDL Ratio: 6
Triglycerides: 153 mg/dL — ABNORMAL HIGH (ref 0.0–149.0)

## 2011-01-14 LAB — HEPATIC FUNCTION PANEL
ALT: 31 U/L (ref 0–53)
AST: 25 U/L (ref 0–37)
Albumin: 4.3 g/dL (ref 3.5–5.2)

## 2011-01-14 NOTE — Progress Notes (Signed)
Labs only

## 2011-01-21 ENCOUNTER — Ambulatory Visit (INDEPENDENT_AMBULATORY_CARE_PROVIDER_SITE_OTHER): Payer: 59 | Admitting: Internal Medicine

## 2011-01-21 ENCOUNTER — Encounter: Payer: Self-pay | Admitting: Internal Medicine

## 2011-01-21 VITALS — BP 130/82 | HR 61 | Temp 98.3°F | Resp 14 | Ht 75.25 in | Wt 225.0 lb

## 2011-01-21 DIAGNOSIS — Z0000 Encounter for general adult medical examination without abnormal findings: Secondary | ICD-10-CM

## 2011-01-21 DIAGNOSIS — R030 Elevated blood-pressure reading, without diagnosis of hypertension: Secondary | ICD-10-CM

## 2011-01-21 DIAGNOSIS — Z136 Encounter for screening for cardiovascular disorders: Secondary | ICD-10-CM

## 2011-01-21 DIAGNOSIS — IMO0001 Reserved for inherently not codable concepts without codable children: Secondary | ICD-10-CM

## 2011-01-21 MED ORDER — GLIMEPIRIDE 2 MG PO TABS: 2.0000 mg | ORAL_TABLET | Freq: Every day | ORAL | Status: DC

## 2011-01-21 MED ORDER — METFORMIN HCL ER 500 MG PO TB24: 500.0000 mg | ORAL_TABLET | Freq: Two times a day (BID) | ORAL | Status: DC

## 2011-01-21 NOTE — Progress Notes (Signed)
Subjective:    Patient ID: Aaron Bradshaw, male    DOB: 09/04/1950, 60 y.o.   MRN: [id number]  HPI  Aaron Bradshaw  is here for a physical; his pre appointment  labs reveal uncontrolled Diabetes.      Review of Systems Patient reports no significant   vision/ hearing changes,anorexia,  fever ,adenopathy, persistant / recurrent hoarseness, swallowing issues,  edema,persistant / recurrent cough, hemoptysis, dyspnea(rest, exertional, paroxysmal nocturnal), gastrointestinal  bleeding (melena, rectal bleeding), abdominal pain, excessive heart burn, GU symptoms( dysuria, hematuria, pyuria, voiding/incontinence  issues) syncope, focal weakness, memory loss, skin/hair/nail changes,depression, anxiety, abnormal bruising/bleeding, or musculoskeletal symptoms/signs.   Diabetes                                                     Excess thirst :no;  Excess hunger:  no ;  Excess urination:  Only with stress.                                  Lightheadedness with standing:  no. Chest pain:  no ; Palpitations :occasionally with stress ;  Pain in  calves with walking:  no .                                                                                                                                 Non healing skin  ulcers or sores,especially over the feet:  no. Numbness or tingling or burning in feet : only Plantar Fascitis. Some numbness in toes after prolonged standing or walking for prolonged periods. .                                                                                                                                              Significant change in  Weight : down 15# with improved diet. Vision changes : no  .  Exercise :weights & cardiovascular 4-5x/week for 45-60 min . Nutrition/diet:  Aaron Bradshaw 's program,Eat for Health. . Eye exam : last 4 years. Foot care : Podiatrist treated Plantar Fasciitis.  A1c:7.1%       Objective:   Physical Exam Gen.: Healthy and well-nourished in appearance. Alert, appropriate and cooperative throughout exam. Head: Normocephalic without obvious abnormalities;  pattern alopecia  Eyes: No corneal or conjunctival inflammation noted. Pupils equal round reactive to light and accommodation. Fundal exam is benign without hemorrhages, exudate, papilledema. Extraocular motion intact. Vision grossly normal. Ears: External  ear exam reveals no significant lesions or deformities. Canals clear .TMs normal. Hearing is grossly normal bilaterally. Nose: External nasal exam reveals no deformity or inflammation. Nasal mucosa are pink and moist. No lesions or exudates noted. Septum without deviation; minor dislocation Mouth: Oral mucosa and oropharynx reveal no lesions or exudates. Teeth in good repair. Neck: No deformities, masses, or tenderness noted. Range of motion &. Thyroid normal. Lungs: Normal respiratory effort; chest expands symmetrically. Lungs are clear to auscultation without rales, wheezes, or increased work of breathing. Heart: Normal rate and rhythm. Normal S1 and S2. No gallop, click, or rub. Grade 1/2-1 systolic  murmur. Abdomen: Bowel sounds normal; abdomen soft and nontender. No masses, organomegaly or hernias noted. Genitalia: as per Aaron Bradshaw                                                                                      Musculoskeletal/extremities: No deformity or scoliosis noted of  the thoracic or lumbar spine but asymmetry of thoracic muscles. No clubbing, cyanosis, edema, or deformity noted. Range of motion  normal .Tone & strength  normal.Joints normal. Nail health  Good except for fungal changes R great toe nail. Vascular: Carotid, radial artery, dorsalis pedis and dorsalis posterior tibial pulses are full and equal. No bruits present. Neurologic: Alert and oriented x3. Deep tendon reflexes symmetrical and normal.          Skin: Intact without suspicious lesions  or rashes. Sun burned Lymph: No cervical or axillary lymphadenopathy present. Psych: Mood and affect are normal. Normally interactive                                                                                    Assessment & Plan:  #1 comprehensive physical exam; no acute findings #2 see Problem List with Assessments & Recommendations #3 diabetes mellitus, uncontrolled Plan: see Orders

## 2011-01-21 NOTE — Patient Instructions (Signed)
Preventive Health Care: Exercise at least 30-45 minutes a day,  3-4 days a week.  Eat a low-fat diet with lots of fruits and vegetables, up to 7-9 servings per day. Consume less than 40 grams of sugar per day from foods & drinks with High Fructose Corn Sugar as #1,2,3 or # 4 on label. Consider The [employer]usters program. Please  schedule fasting Labs in 10 weeks :Lipids, BUN,creat, A1c & urine microalbumin ( 250.02)

## 2011-02-03 ENCOUNTER — Encounter: Payer: Self-pay | Admitting: Internal Medicine

## 2011-02-03 ENCOUNTER — Ambulatory Visit (INDEPENDENT_AMBULATORY_CARE_PROVIDER_SITE_OTHER): Payer: 59 | Admitting: Internal Medicine

## 2011-02-03 DIAGNOSIS — F411 Generalized anxiety disorder: Secondary | ICD-10-CM

## 2011-02-03 DIAGNOSIS — E119 Type 2 diabetes mellitus without complications: Secondary | ICD-10-CM

## 2011-02-03 DIAGNOSIS — E785 Hyperlipidemia, unspecified: Secondary | ICD-10-CM

## 2011-02-03 MED ORDER — LORAZEPAM 0.5 MG PO TABS: 0.5000 mg | ORAL_TABLET | Freq: Three times a day (TID) | ORAL | Status: DC | PRN

## 2011-02-03 MED ORDER — GLUCOSE BLOOD VI STRP: ORAL_STRIP | Status: DC

## 2011-02-03 NOTE — Patient Instructions (Addendum)
Decrease Glimiperide to 2 mg one half pill daily. Please  schedule fasting Labs : BUN, creat,Lipids,A1c, & urine microalbumin in 6 weeks

## 2011-02-03 NOTE — Progress Notes (Signed)
Subjective:    Patient ID: Aaron Bradshaw, male    DOB: May 09, 1951, 60 y.o.   MRN: [id number]  HPI Diabetes status assessment: Fasting or morning glucose range:  105- 152 or average :  No average  . Highest glucose 2 hours after any meal:  < 150. Hypoglycemia :  "weak" with value of 80 .                                                     Excess thirst :no;  Excess hunger:  no ;  Excess urination:  no.                                  Lightheadedness with standing:  no. Chest pain:  no ; Palpitations :no ;  Pain in  calves with walking:  no .                                                                                                                                 Non healing skin  ulcers or sores,especially over the feet:  no. Numbness or tingling or burning in feet : some numbness in feet, ? From plantar fasciitis.                                                                                                                                              Significant change in  Weight : no. Vision changes : no  .                                                                    Exercise : landscaping . Nutrition/diet:  HFCS eliminated. Medication compliance : yes. Medication adverse  Effects:  Metformin "decreased energy"; better since change to lunch schedule . Eye exam : 2009. Foot care : yes  For plantar fasciitis.  A1c/ urine microalbumin monitor:  7.1%      Review of Systems He has intermittent marked anxiety. Fluoxetine had heightened this in past. Doxepin taken  @ bedtime for chronic sleep issues   with some response.      Objective:   Physical Exam Gen.: Healthy and well-nourished in appearance. Alert, appropriate and cooperative throughout exam. Lungs: Normal respiratory effort; chest expands symmetrically. Lungs are clear to auscultation without rales, wheezes, or increased work of breathing. Heart: Normal rate and rhythm. Normal S1 and S2. No gallop, click, or rub.  S4 with slurring  w/o murmur.                                                                                  Musculoskeletal/extremities: No clubbing, cyanosis, edema, or deformity noted. Nail health  good. Vascular: Carotid, radial artery, dorsalis pedis and  posterior tibial pulses are full and equal. No bruits present. Neurologic: Alert and oriented x3. Deep tendon reflexes symmetrical and normal. Light touch normal over feet.  Skin: Intact without suspicious lesions or rashes. Psych: somewhat anxious above Diabetes & "nerves" . Very focused on interventions for Diabetes; questions need for Endocrinology referral        Assessment & Plan:  #1 diabetes;: Glucose recordings are extremely good and well within range. He has had isolated hypoglycemia. This is most likely related to his high level of exercise (30-60 minutes daily) and  Glimiperide.  #2 dyslipidemia; lipids will be monitored, LDL goal will be less than 100. He will be asked to review Dr. Elige Ko book Eat, Drink, and Be Healthy for dietary cholesterol information. Referral will be made to a nutritionist for #1 & 2  #3 anxiety  #4 chronic sleep disorder.  Plan:Fasting lipids and A1c will be checked in 6 weeks as he is very concerned about Diabetes control (Note: I'm concerned about "over control " with hypoglycemic episodes as he has been so adherent to lifestyle interventions).Decrease Glimiperde.  Lorazepam 0.5 mg as needed

## 2011-02-04 NOTE — Progress Notes (Signed)
Addended by: Edgardo Roys on: 02/04/2011 08:19 AM   Modules accepted: Orders

## 2011-02-21 ENCOUNTER — Encounter: Payer: 59 | Attending: Internal Medicine

## 2011-02-21 DIAGNOSIS — Z713 Dietary counseling and surveillance: Secondary | ICD-10-CM | POA: Insufficient documentation

## 2011-02-21 DIAGNOSIS — E119 Type 2 diabetes mellitus without complications: Secondary | ICD-10-CM | POA: Insufficient documentation

## 2011-02-21 NOTE — Progress Notes (Signed)
   Patient was seen on 02/21/2011 for the first of a series of three diabetes self-management courses at the [facility]. The following learning objectives were met by the patient during this course:   Defines diabetes and the role of insulin  Identifies type of diabetes and pathophysiology  States normal BG range and personal goals  Identifies three risk factors for the development of diabetes  States the need for and frequency of healthcare follow up (ADA Standards of Care)   Lab Results  Component Value Date   HGBA1C 7.1* 01/14/2011     Patient has established the following initial goals:  Increase exercise  Work on managing stress levels  Follow Diabetes Meal Plan  Follow-Up Plan: Attend Core Diabetes Classes @ [facility] in August

## 2011-03-10 ENCOUNTER — Encounter: Payer: 59 | Attending: Internal Medicine

## 2011-03-10 DIAGNOSIS — Z713 Dietary counseling and surveillance: Secondary | ICD-10-CM | POA: Insufficient documentation

## 2011-03-10 DIAGNOSIS — E119 Type 2 diabetes mellitus without complications: Secondary | ICD-10-CM | POA: Insufficient documentation

## 2011-03-16 NOTE — Progress Notes (Signed)
  Patient was seen on 03/10/2011 for the second of a series of three diabetes self-management courses at the [facility]. The following learning objectives were met by the patient during this course:   States the relationship of exercise to blood glucose  States benefits/barriers of regular and safe exercise  States three guidelines for safe and effective exercise  Describes personal diabetes medicine regimen  Describes actions of own medications  Describes causes, symptoms, and treatment of hypo/hyperglycemia  Describes sick day rules  Identifies when to test urine for ketones when appropriate  Identifies when to call healthcare provider for acute complications  States the risk for problems with foot, skin, and dental care  States preventative foot, skin, and dental care measures  States when to call healthcare provider regarding foot, skin, and dental care  Identifies methods for evaluation of diabetes control  Discusses benefits of SBGM  Identifies relationship between nutrition, exercise, medication, and glucose levels  Discusses the importance of record keeping  Follow-Up Plan: Patient will attend the final class of the ADA Diabetes Self-Care Education. Patient received [facility] CORE Program Notebook

## 2011-03-17 ENCOUNTER — Encounter: Payer: 59 | Admitting: Dietician

## 2011-03-17 NOTE — Progress Notes (Signed)
  Patient was seen on 03/17/2011 for the third of a series of three diabetes self-management courses at the [facility]. The following learning objectives were met by the patient during this course:   Identifies nutrient effects on glycemia  States the general guidelines of meal planning  Relates understanding of personal meal plan  Describes situations that cause stress and discuss methods of stress management  Identifies lifestyle behaviors for change  The following handouts were given in class:  [employer] Carbohydrate Counting book  3 Month Follow Up Visit handout  Goal setting handout  Class evaluation form  Your patient has established the following 3 month goals for diabetes self-care:  Eat meals on time  Walk/bike/Swim /Aerobic exercise for 60 minutes 5 times per week.  Always carry a quick-acting form of sugar such as glucose pills.  Follow-Up Plan: Patient will attend a 3 month follow-up visit for diabetes self-management education.

## 2011-03-18 ENCOUNTER — Other Ambulatory Visit (INDEPENDENT_AMBULATORY_CARE_PROVIDER_SITE_OTHER): Payer: 59

## 2011-03-18 DIAGNOSIS — T887XXA Unspecified adverse effect of drug or medicament, initial encounter: Secondary | ICD-10-CM

## 2011-03-18 DIAGNOSIS — I10 Essential (primary) hypertension: Secondary | ICD-10-CM

## 2011-03-18 DIAGNOSIS — E785 Hyperlipidemia, unspecified: Secondary | ICD-10-CM

## 2011-03-18 LAB — LIPID PANEL
Cholesterol: 154 mg/dL (ref 0–200)
HDL: 35.2 mg/dL — ABNORMAL LOW (ref >39.00)
LDL Cholesterol: 97 mg/dL (ref 0–99)
Total CHOL/HDL Ratio: 4
Triglycerides: 108 mg/dL (ref 0.0–149.0)

## 2011-03-18 LAB — MICROALBUMIN / CREATININE URINE RATIO
Creatinine,U: 68.4 mg/dL
Microalb Creat Ratio: 0.1 mg/g (ref 0.0–30.0)

## 2011-03-18 LAB — BUN: BUN: 20 mg/dL (ref 6–23)

## 2011-03-18 NOTE — Progress Notes (Signed)
Labs only

## 2011-03-24 ENCOUNTER — Encounter: Payer: Self-pay | Admitting: Internal Medicine

## 2011-03-24 ENCOUNTER — Ambulatory Visit (INDEPENDENT_AMBULATORY_CARE_PROVIDER_SITE_OTHER): Payer: 59 | Admitting: Internal Medicine

## 2011-03-24 DIAGNOSIS — E119 Type 2 diabetes mellitus without complications: Secondary | ICD-10-CM

## 2011-03-24 DIAGNOSIS — G479 Sleep disorder, unspecified: Secondary | ICD-10-CM

## 2011-03-24 MED ORDER — GLUCOSE BLOOD VI STRP: ORAL_STRIP | Status: DC

## 2011-03-24 NOTE — Progress Notes (Signed)
Subjective:    Patient ID: Aaron Bradshaw, male    DOB: 1950/12/15, 60 y.o.   MRN: [id number]  HPI Diabetes status assessment: Fasting or morning glucose range:  80- 120 or average :  114  . Highest glucose 2 hours after any meal:  < 165. Hypoglycemia :  no .                                                     Excess thirst :no;  Excess hunger:  no ;  Excess urination:  no.                                  Lightheadedness with standing:  no. Chest pain:  no ; Palpitations :no ;  Pain in  calves with walking:  no .                                                                                                                                 Non healing skin  ulcers or sores,especially over the feet:  no. Numbness or tingling or burning in feet : some tingling in arms & legs, not feet .                                                                                                                                              Significant change in  Weight : 45#. Vision changes : no  .                                                                    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 Nutrition/diet:  Saw Nutritionist, low carb. Medication compliance : stopped Glimiperide. Medication adverse  Effects:  Tingling ? From Metformin . Eye exam : due. Foot care : no.  A1c/ urine microalbumin monitor:  6.3%  down  from 7.1% in 6/12       Review of Systems he has no difficulty going to sleep but wakes up after several hours with anxiety. He's been reluctant to take the lorazepam.     Objective:   Physical Exam Gen.:Well developed. Healthy and well-nourished in appearance. Alert, appropriate and cooperative throughout exam. Eyes: No corneal or conjunctival inflammation noted.  Neck: No deformities, masses, or tenderness noted.  Thyroid normal . Lungs: Normal respiratory effort; chest expands symmetrically. Lungs are clear to auscultation without rales, wheezes, or increased work of  breathing. Heart: Normal rate and rhythm. Normal S1 and S2. No gallop, click, or rub. No  murmur.                                                                            Musculoskeletal/extremities:  No clubbing, cyanosis, edema, or deformity noted. Range of motion  normal .Tone & strength  normal.Joints normal. Mild fungal nail changes. Ganglion R sole. Vascular: Carotid, radial artery, dorsalis pedis and  posterior tibial pulses are full and equal. No bruits present. Neurologic: Alert and oriented x3. Deep tendon reflexes symmetrical and normal.          Skin: Intact without suspicious lesions or rashes. Psych: Mood and affect are normal. Normally interactive                                                                                         Assessment & Plan:  #1 diabetes, dramatic improvement with lifestyle changes. Toxicity has reversed; clinically it is not indicated.  #2 ganglion right foot, referral will be made at his discretion to podiatry or orthopedic foot specialist  #3 sleep disorder, options discussed. Sleep hygiene reviewed.

## 2011-03-24 NOTE — Patient Instructions (Addendum)
To prevent sleep dysfunction follow these instructions for sleep hygiene. Do not read, watch TV, or eat in bed. Do not get into bed until you are ready to turn off the light &  to go to sleep. Do not ingest stimulants ( decongestants, diet pills, nicotine, caffeine) after the evening meal.Use Lorazepam as needed if awakening  early am.  Check the fasting glucose Monday, Wednesday, Friday, and Sunday. Your goal is 90-150. Check the sugar 2 hours after breakfast on Tuesday; 2 hours after lunch on Thursday; and 2 hours after eating meals on Saturday. That goal would be less than 180, ideally less than 160.  Please  schedule fasting Labs in 4 months : BUN,creat,K+,Lipids, A1c, urine microalbumin (250.00)

## 2011-04-07 ENCOUNTER — Ambulatory Visit: Payer: 59

## 2011-04-14 ENCOUNTER — Ambulatory Visit: Payer: 59

## 2011-04-29 LAB — DIFFERENTIAL
Basophils Relative: 1 % (ref 0–1)
Monocytes Absolute: 0.4 10*3/uL (ref 0.1–1.0)
Monocytes Relative: 7 % (ref 3–12)
Neutro Abs: 3.3 10*3/uL (ref 1.7–7.7)

## 2011-04-29 LAB — COMPREHENSIVE METABOLIC PANEL
ALT: 49 U/L (ref 0–53)
Albumin: 3.6 g/dL (ref 3.5–5.2)
Alkaline Phosphatase: 64 U/L (ref 39–117)
BUN: 14 mg/dL (ref 6–23)
GFR calc Af Amer: >60 mL/min (ref >60)
Potassium: 4.3 mEq/L (ref 3.5–5.1)
Sodium: 137 mEq/L (ref 135–145)
Total Protein: 6.1 g/dL (ref 6.0–8.3)

## 2011-04-29 LAB — CBC
Platelets: 187 10*3/uL (ref 150–400)
RDW: 12.7 % (ref 11.5–15.5)

## 2011-05-17 ENCOUNTER — Other Ambulatory Visit: Payer: Self-pay | Admitting: Dermatology

## 2011-07-13 ENCOUNTER — Encounter: Payer: 59 | Attending: Internal Medicine | Admitting: Dietician

## 2011-07-15 ENCOUNTER — Encounter: Payer: Self-pay | Admitting: Dietician

## 2011-07-15 NOTE — Progress Notes (Signed)
  Patient was seen on 07/13/2011 for their 3 month follow-up as a part of the diabetes self-management courses at the [facility]. The following learning objectives were met by your patient during this course:  Please see Diabetes Flow sheet for findings related to patient's self-care.  Patient self reports the following:  Diabetes control has improved since diabetes self-management training: Yes, reports that A1C last was 6.2 %. Number of days blood glucose is >200: None Last MD appointment for diabetes: September, has an appointment coming up. Changes in treatment plan: Has had to d/c the Glimepiride and is not taking 1 Metformin in the evenings. Confidence with ability to manage diabetes: Feels very confident in managing DM Areas for improvement with diabetes self-care: Continuing to read labels and practice portion control. Willingness to participate in diabetes support group: Is attending support group   Follow-Up Plan: Patient is eligible for a "free" 30 minute diabetes self-care appointment in the next year. Patient to call and schedule as needed.

## 2011-07-21 ENCOUNTER — Other Ambulatory Visit: Payer: Self-pay | Admitting: Internal Medicine

## 2011-07-21 DIAGNOSIS — E119 Type 2 diabetes mellitus without complications: Secondary | ICD-10-CM

## 2011-07-22 ENCOUNTER — Other Ambulatory Visit (INDEPENDENT_AMBULATORY_CARE_PROVIDER_SITE_OTHER): Payer: 59

## 2011-07-22 DIAGNOSIS — E119 Type 2 diabetes mellitus without complications: Secondary | ICD-10-CM

## 2011-07-22 LAB — POTASSIUM: Potassium: 5.8 mEq/L — ABNORMAL HIGH (ref 3.5–5.1)

## 2011-07-22 LAB — HEMOGLOBIN A1C: Hgb A1c MFr Bld: 6 % (ref 4.6–6.5)

## 2011-07-22 LAB — LIPID PANEL
HDL: 40.4 mg/dL (ref >39.00)
VLDL: 33.8 mg/dL (ref 0.0–40.0)

## 2011-07-22 NOTE — Progress Notes (Signed)
LABS ONLY  

## 2011-08-01 ENCOUNTER — Telehealth: Payer: Self-pay | Admitting: Internal Medicine

## 2011-08-01 NOTE — Telephone Encounter (Signed)
Patient is requesting lab results.

## 2011-08-01 NOTE — Telephone Encounter (Signed)
Spoke with patient, schedule appointment to further discuss labs tomorrow

## 2011-08-02 ENCOUNTER — Encounter: Payer: Self-pay | Admitting: Internal Medicine

## 2011-08-02 ENCOUNTER — Ambulatory Visit (INDEPENDENT_AMBULATORY_CARE_PROVIDER_SITE_OTHER): Payer: 59 | Admitting: Internal Medicine

## 2011-08-02 DIAGNOSIS — E119 Type 2 diabetes mellitus without complications: Secondary | ICD-10-CM

## 2011-08-02 DIAGNOSIS — E1165 Type 2 diabetes mellitus with hyperglycemia: Secondary | ICD-10-CM | POA: Insufficient documentation

## 2011-08-02 NOTE — Progress Notes (Signed)
  Subjective:    Patient ID: Aaron Bradshaw, male    DOB: [birth year], 61 y.o.   MRN: [id number]  HPI Diabetes status assessment : Fasting or morning glucose range: 111-120  Highest glucose 2 hours after any meal :< 160. Hypoglycemia : rare  . ROS: Excess thirst /  hunger  / urination:  no.  Lightheadedness with standing: no . Chest pain;Palpitations;Pain in  calves with walking : no . Non healing skin  ulcers or sores : no  Numbness , tingling or burning in feet :no.                                                                    Significant change in  Weight : up 6 # over holidays .  Vision changes:  No ; neg Ophth 12/12 .  Exercise : daily.   Nutrition/diet : some lapse with holidays.                                                                                Medication compliance: yes Adverse  Medication effects: no .  Foot care:  11/12 ; no issues A1c/ urine microalbumin monitor:  A1 C. is down to 6%; 6 months ago was 7.1%. Triglycerides have risen from 108-169 reflecting dietary changes during the holidays. LDL has risen from 97 to 125    Review of Systems     Objective:   Physical Exam He appears healthy and well-nourished; he is in no acute distress  No carotid bruits are present.  Heart rhythm  normal ; rate slow with no significant murmurs or gallops.  Chest is clear with no increased work of breathing  There is no evidence of aortic aneurysm or renal artery bruits  He has no clubbing or edema; pes planus present   Pedal pulses are intact   No ischemic skin changes are present . Chronic fungal toenail changes of the right great toe        Assessment & Plan:

## 2011-08-02 NOTE — Patient Instructions (Signed)
The most common cause of elevated triglycerides is the ingestion of sugar from high fructose corn syrup sources added to processed foods & drinks.  Eat a low-fat diet with lots of fruits and vegetables, up to 7-9 servings per day. Consume less than 40 grams, preferably ZERO, of sugar per day from foods & drinks with High Fructose Corn Syrup (HFCS) sugar as #1,2,3 or # 4 on label.Whole [employer], [employer] & [employer] do not carry products with HFCS. Follow a  low carb nutrition program such as [place] or The New Sugar Busters  to prevent Diabetes progression . White carbohydrates (potatoes, rice, bread, and pasta) have a high spike of sugar and a high load of sugar. For example a  baked potato has a cup of sugar and a  french fry  2 teaspoons of sugar. Yams, wild  rice, whole grained bread &  wheat pasta have been much lower spike and load of  sugar. Portions should be the size of a deck of cards or your palm.  Please  schedule fasting Labs : A1c, urine microalbumin,Lipids. PLEASE BRING THESE INSTRUCTIONS TO FOLLOW UP  LAB APPOINTMENT.This will guarantee correct labs are drawn, eliminating need for repeat blood sampling ( needle sticks ! ). Diagnoses /Codes: 250.00,272.4.

## 2012-01-18 ENCOUNTER — Ambulatory Visit (INDEPENDENT_AMBULATORY_CARE_PROVIDER_SITE_OTHER): Payer: 59 | Admitting: Internal Medicine

## 2012-01-18 ENCOUNTER — Encounter: Payer: Self-pay | Admitting: Internal Medicine

## 2012-01-18 VITALS — BP 124/84 | HR 53 | Wt 222.8 lb

## 2012-01-18 DIAGNOSIS — IMO0001 Reserved for inherently not codable concepts without codable children: Secondary | ICD-10-CM

## 2012-01-18 DIAGNOSIS — C4490 Unspecified malignant neoplasm of skin, unspecified: Secondary | ICD-10-CM | POA: Insufficient documentation

## 2012-01-18 DIAGNOSIS — E119 Type 2 diabetes mellitus without complications: Secondary | ICD-10-CM

## 2012-01-18 DIAGNOSIS — E785 Hyperlipidemia, unspecified: Secondary | ICD-10-CM

## 2012-01-18 DIAGNOSIS — R030 Elevated blood-pressure reading, without diagnosis of hypertension: Secondary | ICD-10-CM

## 2012-01-18 DIAGNOSIS — M791 Myalgia, unspecified site: Secondary | ICD-10-CM

## 2012-01-18 LAB — HEPATIC FUNCTION PANEL
Alkaline Phosphatase: 58 U/L (ref 39–117)
Bilirubin, Direct: 0.1 mg/dL (ref 0.0–0.3)
Total Bilirubin: 1.7 mg/dL — ABNORMAL HIGH (ref 0.3–1.2)

## 2012-01-18 LAB — TSH: TSH: 3.57 u[IU]/mL (ref 0.35–5.50)

## 2012-01-18 LAB — HEMOGLOBIN A1C: Hgb A1c MFr Bld: 6.5 % (ref 4.6–6.5)

## 2012-01-18 LAB — LIPID PANEL
HDL: 37.3 mg/dL — ABNORMAL LOW (ref >39.00)
LDL Cholesterol: 117 mg/dL — ABNORMAL HIGH (ref 0–99)
Total CHOL/HDL Ratio: 5
VLDL: 27.8 mg/dL (ref 0.0–40.0)

## 2012-01-18 LAB — BASIC METABOLIC PANEL
GFR: 91.42 mL/min (ref >60.00)
Glucose, Bld: 126 mg/dL — ABNORMAL HIGH (ref 70–99)
Potassium: 5.1 mEq/L (ref 3.5–5.1)
Sodium: 139 mEq/L (ref 135–145)

## 2012-01-18 LAB — MICROALBUMIN / CREATININE URINE RATIO
Creatinine,U: 65.4 mg/dL
Microalb Creat Ratio: 1.4 mg/g (ref 0.0–30.0)
Microalb, Ur: 0.9 mg/dL (ref 0.0–1.9)

## 2012-01-18 NOTE — Assessment & Plan Note (Signed)
If A1c is elevated; LDL minimal goal is less than 100, ideally less than 70. Fasting lipids today

## 2012-01-18 NOTE — Progress Notes (Signed)
Subjective:    Patient ID: Aaron Bradshaw, male    DOB: [birth year], 61 y.o.   MRN: [id number]  HPI HYPERTENSION: Blood pressure had been elevated in the past in the setting of stress with short staff @ work Disease Monitoring: Blood pressure range-with wrist cuff 105-130/60-85  Chest pain, palpitations- rare palpitations      Dyspnea- no Medications: Compliance-ACE-I  D/Ced as BP good  Lightheadedness,Syncope- no    Edema- no  DIABETES: Disease Monitoring: Blood Sugar ranges-105-138; high 2 hr post meal < 160 Polyuria/phagia/dipsia- no       Visual problems- no; neg Ophth exam 2013 , no retinopathy Medications: Compliance- no meds;  Diet: no HFCS , heart healthy Hypoglycemic symptoms- rare into 80s w/o symptoms except slight weakness  HYPERLIPIDEMIA: Disease Monitoring: See symptoms for Hypertension Medications: Compliance- no meds   Abd pain, bowel changes- no   Muscle aches-only post weights and with dehydration working outside     Past medical history/family history/social history were all reviewed and updated.     Review of Systems  He does have some plantar fasciitis symptoms for which he has seen Dr Forest Becker, Podiatrist. He has intermittent joint pain; he is not on meloxicam regularly. He rarely takes nonsteroidals     Objective:   Physical Exam Gen.: Healthy and well-nourished in appearance. Alert, appropriate and cooperative throughout exam. Head: Normocephalic without obvious abnormalities Eyes: No corneal or conjunctival inflammation noted.  Neck: No deformities, masses, or tenderness noted. Range of motion & Thyroid  normal. Lungs: Normal respiratory effort; chest expands symmetrically. Lungs are clear to auscultation without rales, wheezes, or increased work of breathing. Heart: Slow rate and regular  rhythm. Normal S1 and S2. No gallop, click, or rub. S4 w/o  murmur. Abdomen: Bowel sounds normal; abdomen soft and nontender. No masses, organomegaly  or hernias noted. Genitalia:Dr Tannebaum                                                                               Musculoskeletal/extremities: No deformity or scoliosis noted of  the thoracic or lumbar spine; but there is some asymmetry of the posterior thoracic musculature suggesting occult scoliosis. . No clubbing, cyanosis, edema, or deformity noted. Range of motion  normal .Tone & strength  normal.Joints normal. Nail health  Good except minor toenail changes. Pes planus bilaterally Vascular: Carotid, radial artery, dorsalis pedis and  posterior tibial pulses are full and equal. No bruits present. Neurologic: Alert and oriented x3. Deep tendon reflexes symmetrical and normal.          Skin: Intact without suspicious lesions or rashes. Lymph: No cervical, axillary lymphadenopathy present. Psych: Mood and affect are normal. Normally interactive                                                                                         Assessment &  Plan:

## 2012-01-18 NOTE — Patient Instructions (Addendum)
Blood Pressure Goal  Ideally is an AVERAGE < 135/85. This AVERAGE should be calculated from @ least 5-7 BP readings taken @ different times of day on different days of week. You should not respond to isolated BP readings , but rather the AVERAGE for that week  Please try to go on My Chart within the next 24 hours to allow me to release the results directly to you.  

## 2012-01-18 NOTE — Assessment & Plan Note (Signed)
History his diabetes with therapeutic life changes of exercise and diet. He is not on ACE inhibitor. Urine microalbumin will determine whether this is initiated prophylactically.

## 2012-01-31 ENCOUNTER — Other Ambulatory Visit: Payer: Self-pay

## 2012-01-31 MED ORDER — PRAVASTATIN SODIUM 20 MG PO TABS: 20.0000 mg | ORAL_TABLET | Freq: Every day | ORAL | Status: DC

## 2012-01-31 NOTE — Telephone Encounter (Signed)
RX sent

## 2012-04-16 ENCOUNTER — Telehealth: Payer: Self-pay

## 2012-04-16 MED ORDER — GLUCOSE BLOOD VI STRP: ORAL_STRIP | Status: DC

## 2012-04-16 NOTE — Telephone Encounter (Signed)
Pt calling requesting refill of his diabetic test strips, sent to [facility] [place].

## 2012-04-17 ENCOUNTER — Other Ambulatory Visit: Payer: Self-pay | Admitting: Internal Medicine

## 2012-06-12 ENCOUNTER — Other Ambulatory Visit (INDEPENDENT_AMBULATORY_CARE_PROVIDER_SITE_OTHER): Payer: 59

## 2012-06-12 DIAGNOSIS — IMO0002 Reserved for concepts with insufficient information to code with codable children: Secondary | ICD-10-CM

## 2012-06-12 DIAGNOSIS — E785 Hyperlipidemia, unspecified: Secondary | ICD-10-CM

## 2012-06-12 DIAGNOSIS — T887XXA Unspecified adverse effect of drug or medicament, initial encounter: Secondary | ICD-10-CM

## 2012-06-12 LAB — MICROALBUMIN / CREATININE URINE RATIO
Creatinine,U: 114 mg/dL
Microalb Creat Ratio: 0.3 mg/g (ref 0.0–30.0)
Microalb, Ur: 0.3 mg/dL (ref 0.0–1.9)

## 2012-06-12 LAB — LIPID PANEL: Total CHOL/HDL Ratio: 6

## 2012-06-12 LAB — HEPATIC FUNCTION PANEL
AST: 21 U/L (ref 0–37)
Alkaline Phosphatase: 57 U/L (ref 39–117)
Total Bilirubin: 1.6 mg/dL — ABNORMAL HIGH (ref 0.3–1.2)

## 2012-06-12 LAB — CK: Total CK: 140 U/L (ref 7–232)

## 2012-06-18 ENCOUNTER — Encounter: Payer: Self-pay | Admitting: Internal Medicine

## 2012-06-18 ENCOUNTER — Ambulatory Visit (INDEPENDENT_AMBULATORY_CARE_PROVIDER_SITE_OTHER): Payer: 59 | Admitting: Internal Medicine

## 2012-06-18 VITALS — BP 136/80 | HR 72 | Resp 12 | Wt 225.0 lb

## 2012-06-18 DIAGNOSIS — E349 Endocrine disorder, unspecified: Secondary | ICD-10-CM | POA: Insufficient documentation

## 2012-06-18 DIAGNOSIS — M722 Plantar fascial fibromatosis: Secondary | ICD-10-CM

## 2012-06-18 DIAGNOSIS — E119 Type 2 diabetes mellitus without complications: Secondary | ICD-10-CM

## 2012-06-18 DIAGNOSIS — E785 Hyperlipidemia, unspecified: Secondary | ICD-10-CM

## 2012-06-18 NOTE — Assessment & Plan Note (Signed)
A1c is now in the nondiabetic range; no change is necessary. The A1c should be checked in 6 months. The urine microalbumin reveals no microscopic kidney disease

## 2012-06-18 NOTE — Patient Instructions (Addendum)
The most common cause of elevated triglycerides is the ingestion of sugar from high fructose corn syrup sources added to processed foods & drinks.  Eat a low-fat diet with lots of fruits and vegetables, up to 7-9 servings per day. Consume less than 40 (preferably ZERO) grams of sugar per day from foods & drinks with High Fructose Corn Syrup (HFCS) sugar as #1,2,3 or # 4 on label.Whole [employer], [employer] & [employer] do not carry products with HFCS. Follow a  low carb nutrition program such as [place] or [employer]  to prevent Diabetes  . White carbohydrates (potatoes, rice, bread, and pasta) have a high spike of sugar and a high load of sugar. For example a  baked potato has a cup of sugar and a  french fry  2 teaspoons of sugar. Yams, wild  rice, whole grained bread &  wheat pasta have been much lower spike and load of  sugar. Portions should be the size of a deck of cards or your palm.  Annual  retinal assessment is indicated because of  The past hx of diabetes.  Please  schedule fasting Labs in 6 mos : A1c , urine microalbumin,Lipids TSH.PLEASE BRING THESE INSTRUCTIONS TO FOLLOW UP  LAB APPOINTMENT.This will guarantee correct labs are drawn, eliminating need for repeat blood sampling ( needle sticks ! ). Diagnoses /Codes: 272.4,790.29.

## 2012-06-18 NOTE — Progress Notes (Signed)
Subjective:    Patient ID: Aaron Bradshaw, male    DOB: 1950/10/11, 61 y.o.   MRN: [id number]  HPI The patient is here for followup of diabetes, hyperlipidemia, andPMH of  hypertension.  The most recent A1c  11/19  was  5.9 , which correlates to an average sugar of 123 , and long-term risk of 18 % . A1c was 7.1 % 01/14/11 Fasting blood sugar ranges  100-125  .  Highest two-hour postprandial glucose is  < 196, usually < 130 . Hypoglycemia X 2 mid am or mid afternoon when working Last ophthalmologic examination 12 mos ago  revealed no retinopathy. Podiatry assessment Fall 2012. Diet is low carb  . Exercise  5X/ week for 2-3 hrs .  The most recent lipids  11/19  reveal LDL 109  , HDL30.4  , and triglycerides 182   . He dies not  Remember receiving Rx for statin.  Blood pressure range  or average is < 120/80. Elevated blood pressure is not been a problem since he lost weight from 245 to 218.         Review of Systems Constitutional: No fever, chills, significant weight change, fatigue, weakness or night sweats Eyes: No  blurred vision, double vision, or loss of vision Cardiovascular: no chest pain, palpitations, racing, irregular rhythm,syncope,nausea,sweating, claudication, or edema  Respiratory: No exertinal dyspnea, paroxysmal nocturnal dyspnea Gastrointestinal: No heartburn,dysphagia, diarrhea, significant constipation, rectal bleeding, melena Genitourinary: No dysuria,hematuria, pyuria, frequency,  incontinence, nocturia Musculoskeletal: No myalgias or muscle cramping , weakness, or cyanosis Dermatologic: No rash, pruritus, urticaria, or change in color or temperature of skin Neurologic: No  limb weakness. Some sole  numbness , ? from plantar fasciitis Endocrine: No change in hair/skin/ nails, excessive thirst, excessive hunger, excessive urination, or unexplained fatigue       Objective:   Physical Exam Gen.: Healthy and well-nourished in appearance. Alert, appropriate  and cooperative throughout exam. Eyes: No corneal or conjunctival inflammation noted.  Neck: No deformities, masses, or tenderness noted. Range of motion & Thyroid normal. Lungs: Normal respiratory effort; chest expands symmetrically. Lungs are clear to auscultation without rales, wheezes, or increased work of breathing. Heart: Slow rate and regular rhythm. Normal S1 and S2. No gallop, click, or rub. No murmur. Abdomen: Bowel sounds normal; abdomen soft and nontender. No masses, organomegaly or hernias noted.                                                                   Musculoskeletal/extremities: No deformity or scoliosis noted of  the thoracic or lumbar spine. No clubbing, cyanosis, edema noted. Flexion L 5th finger .Tone & strength  normal.Joints normal. Nail health  Good.Pes planus Vascular: Carotid, radial artery, dorsalis pedis and  posterior tibial pulses are full and equal. No bruits present. Neurologic: Alert and oriented x3. Deep tendon reflexes symmetrical and normal.          kin: Intact without suspicious lesions or rashes. Lymph: No cervical, axillary lymphadenopathy present. Psych: Mood and affect are normal. Normally interactive  Assessment & Plan:

## 2012-06-18 NOTE — Assessment & Plan Note (Signed)
He continues to attend the outpatient diabetes nutritional classes. I asked him to discuss the rise in triglycerides with Ms. May. A statin will not be initiated as his LDL should get to goal if the triglycerides returne to normal.

## 2012-07-02 ENCOUNTER — Ambulatory Visit (INDEPENDENT_AMBULATORY_CARE_PROVIDER_SITE_OTHER): Payer: 59 | Admitting: Sports Medicine

## 2012-07-02 ENCOUNTER — Encounter: Payer: Self-pay | Admitting: Sports Medicine

## 2012-07-02 VITALS — BP 137/78 | HR 56 | Ht 75.0 in | Wt 218.0 lb

## 2012-07-02 DIAGNOSIS — M722 Plantar fascial fibromatosis: Secondary | ICD-10-CM

## 2012-07-02 NOTE — Progress Notes (Signed)
  Subjective:    Patient ID: Aaron Bradshaw, male    DOB: [birth year], 61 y.o.   MRN: [id number]  HPI  Patient is a 61 yo new patient complaining of foot pain, left greater than right. Patient states he initially had left heel pain 8 years ago that he experienced after running. Since then he has had intermittent heel and arch pain, worse with standing/walking/exercising. 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 He was diagnosed with DM type II 1.5 years ago and went to see a podiatrist at that time for routine foot care. He described his pain to the podiatrist and was given hard inserts which did not help his pain at all. Since then, he has tried softer inserts from a local store which gave some relief.   He describes his pain mostly in the midfoot of his left foot. He states he has heel pain after exercise, but most of his pain is in his arch. He also has some numbness of the toes of his left (not his right.) He has not tried any ice or heat, and rarely takes anti-inflammatory.   Of note, patient has a cyst on his right plantar fascia which is being followed by dermatology.   Review of Systems Negative except as mentioned in HPI above    Objective:   Physical Exam  General: Healthy appearing male, awake, alert, NAD Extremities: No edema. Skin warm and dry. Neurovascularly intact Left foot: Good ROM of ankle. No TTP of achilles. Unable to illicit pain of heel. Mild pain at PF insertion and length of PF. Able to visualize thickened PF with extension of great toe.  Right foot: 1.5 cm palpable cyst midfoot overlying PF. Mild tenderness to palpation. No numbness or tingling Standing: Flatten arch. No splaying of toes Walking: Some supination with step. Normal stride  Ultrasound of bilateral feet: Ultrasound of the left plantar fascia shows a small hyperechoic area in the mid  substance of the plantar fascia. This likely represents a small fibroma. The plantar fascia does not appear to be markedly enlarged at the origin. Ultrasound of the right plantar fascia shows a 1.5cm cystic lesion just superficial to the mid substance of the plantar fascia. This likely represents a simple ganglion in this area. The plantar fascia does not appear to be markedly enlarged at the origin.     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 Can also address right shoulder pain that patient mentioned during visit today. We will plan on a complete shoulder ultrasound at that time.  Jaslynne Dahan M. Sneha Willig, M.D.

## 2012-07-20 ENCOUNTER — Other Ambulatory Visit: Payer: Self-pay | Admitting: Dermatology

## 2012-07-30 ENCOUNTER — Encounter: Payer: Self-pay | Admitting: Sports Medicine

## 2012-07-30 ENCOUNTER — Ambulatory Visit (INDEPENDENT_AMBULATORY_CARE_PROVIDER_SITE_OTHER): Payer: 59 | Admitting: Sports Medicine

## 2012-07-30 VITALS — BP 143/79 | HR 60 | Ht 75.0 in | Wt 218.0 lb

## 2012-07-30 DIAGNOSIS — M25519 Pain in unspecified shoulder: Secondary | ICD-10-CM

## 2012-07-30 NOTE — Progress Notes (Signed)
  Subjective:    Patient ID: Aaron Bradshaw, male    DOB: [birth year], 62 y.o.   MRN: [id number]  HPI chief complaint: Right shoulder pain  Patient is a right-hand-dominant 63 year old male that comes in today complaining of several months of intermittent right shoulder pain. No trauma. He describes a deep ache in his shoulder which is present mainly after exercise. He enjoys weight lifting as well as swimming. His symptoms are most noticeable immediately after exercise but also sometimes at night. He is also experiencing some weakness in the right arm and right shoulder. He has a history of a prior left shoulder rotator cuff repair done by Dr. Thurston Hole 9 years ago. This was a traumatic rotator cuff tear that responded well to surgery. He denies any prior right shoulder surgeries. His symptoms are "annoying" but not terribly pain. No associated numbness or tingling.  Of note, his plantar fascial pain is 75% improved with his green sports insoles and arch straps. He's been diligent about doing his home exercises.    Review of Systems     Objective:   Physical Exam Well-developed, well-nourished. No acute distress. Awake alert and oriented x3. Vital signs are reviewed.  Right shoulder: Full range of motion. Mildly positive painful ARC. Mildly positive empty can. No tenderness along the clavicle or over the a.c.joint. No tenderness over the bicipital groove. Rotator cuff strength is 5/5 but reproducible of pain with resisted supraspinatus on the right. Positive Hawkins. Negative O'Brien. Neurovascularly intact distally.  Left shoulder: Full range of motion. Rotator cuff strength is 5/5. No signs of impingement. Neurovascularly intact distally.  [facility] ultrasound of the right shoulder was performed. A.c. joint showed some slight spurring. There is an area of slight hypoechogenicity at the distal supraspinatus tendon near the insertion. It appears to be a partial thickness articular sided tear.  Subscapularis is within normal limits as is the infraspinatus and teres minor. There is no noticeable atrophy or retraction of any of the tendons. Biceps tendon is normally located in the bicipital groove.         Assessment & Plan:  1. Right shoulder pain likely secondary to partial articular side supraspinatus tear 2. Improving plantar fascial pain  Patient's symptoms in his right shoulder are currently tolerable. I've given him a complete Jobe rotator cuff program to do at home. He will be cautious of overhead activity in the gym but can continue with activity as tolerated. Followup in 6 weeks. We discussed the possibility of a single cortisone injection or possibly a trial of topical nitroglycerin if symptoms persist.

## 2012-09-10 ENCOUNTER — Ambulatory Visit (INDEPENDENT_AMBULATORY_CARE_PROVIDER_SITE_OTHER): Payer: 59 | Admitting: Sports Medicine

## 2012-09-10 VITALS — BP 130/75 | Ht 75.0 in | Wt 220.0 lb

## 2012-09-10 DIAGNOSIS — M719 Bursopathy, unspecified: Secondary | ICD-10-CM

## 2012-09-10 DIAGNOSIS — M67919 Unspecified disorder of synovium and tendon, unspecified shoulder: Secondary | ICD-10-CM

## 2012-09-11 NOTE — Progress Notes (Signed)
  Subjective:    Patient ID: Aaron Bradshaw, male    DOB: 12-Dec-1950, 62 y.o.   MRN: [id number]  HPI Patient comes in today for followup. Right shoulder is feeling better. He still getting intermittent discomfort with certain activities but he is able to continue with both swimming and his weight lifting without much difficulty. He denies any significant nighttime pain. He has been diligent about doing his Jobe exercises.   Review of Systems     Objective:   Physical Exam Well-developed, well-nourished. No acute distress   Right shoulder: Full painless range of motion. Negative painful ARC. No tenderness over the a.c. joint. Rotator cuff strength is 5/5. Negative empty can, negative Hawkins. Neurovascularly intact distally.       Assessment & Plan:  1. Improved right shoulder pain secondary to rotator cuff tendinopathy  Patient will continue with his Jobe exercises. I think he should make this a part of his regular workout routine. I've also cautioned him about repetitive or heavy overhead lifting. Otherwise I think he can continue with activity as tolerated and followup with me when necessary.

## 2012-12-19 ENCOUNTER — Other Ambulatory Visit (INDEPENDENT_AMBULATORY_CARE_PROVIDER_SITE_OTHER): Payer: 59

## 2012-12-19 DIAGNOSIS — R7309 Other abnormal glucose: Secondary | ICD-10-CM

## 2012-12-19 DIAGNOSIS — E785 Hyperlipidemia, unspecified: Secondary | ICD-10-CM

## 2012-12-19 LAB — HEMOGLOBIN A1C: Hgb A1c MFr Bld: 6.4 % (ref 4.6–6.5)

## 2012-12-19 LAB — LIPID PANEL
Total CHOL/HDL Ratio: 5
VLDL: 27.2 mg/dL (ref 0.0–40.0)

## 2012-12-19 LAB — TSH: TSH: 3.96 u[IU]/mL (ref 0.35–5.50)

## 2012-12-19 LAB — MICROALBUMIN / CREATININE URINE RATIO
Creatinine,U: 98.5 mg/dL
Microalb Creat Ratio: 0.2 mg/g (ref 0.0–30.0)

## 2012-12-27 ENCOUNTER — Encounter: Payer: Self-pay | Admitting: Internal Medicine

## 2012-12-27 ENCOUNTER — Ambulatory Visit (INDEPENDENT_AMBULATORY_CARE_PROVIDER_SITE_OTHER): Payer: 59 | Admitting: Internal Medicine

## 2012-12-27 VITALS — BP 114/78 | HR 61 | Wt 225.2 lb

## 2012-12-27 DIAGNOSIS — E119 Type 2 diabetes mellitus without complications: Secondary | ICD-10-CM

## 2012-12-27 DIAGNOSIS — E785 Hyperlipidemia, unspecified: Secondary | ICD-10-CM

## 2012-12-27 NOTE — Patient Instructions (Addendum)
Please  schedule fasting Labs in 6 mos : Lipids, A1c. Codes: [phone].

## 2012-12-27 NOTE — Progress Notes (Signed)
Subjective:    Patient ID: Aaron Bradshaw, male    DOB: 11/21/1950, 62 y.o.   MRN: [id number]  HPI Diabetes status assessment: Fasting or morning glucose  average is 119 Highest glucose 2 hours after any meal is 185 (excess meal size & increased carbs ), usually < 130. Hypoglycemia to 60 with excess activity & decreased food intake X 3; always pre lunch. 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 Specifically he denies chest pain, palpitations, dyspnea, or claudication. Family history is negative for premature coronary disease. His LDL goal is less than 100 , ideally < 70 with Diabetes.   No excess thirst ;  excess hunger ; or excess urination reported                              No lightheadedness with standing reported                         No non healing skin  ulcers or sores of extremities noted. No numbness or tingling or burning in feet described                                                                                                                                             No significant change in weight . No blurred,double, or loss of vision reported  .      Objective:   Physical Exam Gen.: Healthy and well-nourished in appearance. Alert, appropriate and cooperative throughout exam.   Eyes: No corneal or conjunctival inflammation noted.  Neck: No deformities, masses, or tenderness noted.  Thyroid normal. Lungs: Normal respiratory effort; chest expands symmetrically.  Lungs are clear to auscultation without rales, wheezes, or increased work of  breathing. Heart: Slow rate andregular rhythm. Normal S1 and S2. No gallop, click, or rub. No murmur. Abdomen: Bowel sounds normal; abdomen soft and nontender. No masses, organomegaly or hernias noted.                                 Musculoskeletal/extremities: There is some asymmetry of the posterior thoracic musculature suggesting occult scoliosis. No clubbing, cyanosis, edema, or significant extremity  deformity noted. Range of motion normal .Tone & strength  Normal. Joints normal . Fungal great nail changes.Pes planus. Ganglion R sole Able to lie down & sit up w/o help.  Vascular: Carotid, radial artery, dorsalis pedis and  posterior tibial pulses are full and equal. No bruits present. Neurologic: Alert and oriented x3. Deep tendon reflexes symmetrical and normal. Decreased light touch vntral R great toe Skin: Intact without suspicious lesions or rashes. Lymph: No cervical, axillary lymphadenopathy present. Psych: Mood and affect are normal. Normally interactive                                                                                        Assessment & Plan:  See Current Assessment & Plan in Problem List under specific Diagnosis

## 2012-12-27 NOTE — Assessment & Plan Note (Signed)
His LDL is mildly elevated at 161 and HDL slightly reduced at 33.3; lifestyle interventions were recommended. Fasting lipids will be recommended in 6 months. If A1c is in the frankly diabetic range his LDL should be 70. This would also be the goal should he choose to take testosterone supplementation.

## 2012-12-27 NOTE — Assessment & Plan Note (Signed)
His A1c has increased from 5.9 in November 2013 to value of 6.4%. This is associated with a 28% long-term risk. It would be considered "prediabetes". He has no retinopathy and his urine microalbumin is normal. The A1c has risen in the context of suboptimal dietary program recently and decreased cardiovascular exercise.  In lieu of medications focus on lifestyle interventions were discussed. A1c  would be rechecked in 6 months

## 2013-01-31 ENCOUNTER — Encounter: Payer: Self-pay | Admitting: Urology

## 2013-02-26 ENCOUNTER — Ambulatory Visit (INDEPENDENT_AMBULATORY_CARE_PROVIDER_SITE_OTHER): Payer: 59 | Admitting: Internal Medicine

## 2013-02-26 ENCOUNTER — Encounter: Payer: Self-pay | Admitting: Internal Medicine

## 2013-02-26 VITALS — BP 124/76 | HR 69 | Temp 98.1°F | Wt 227.0 lb

## 2013-02-26 DIAGNOSIS — IMO0001 Reserved for inherently not codable concepts without codable children: Secondary | ICD-10-CM

## 2013-02-26 DIAGNOSIS — T148 Other injury of unspecified body region: Secondary | ICD-10-CM

## 2013-02-26 DIAGNOSIS — M2550 Pain in unspecified joint: Secondary | ICD-10-CM

## 2013-02-26 DIAGNOSIS — R300 Dysuria: Secondary | ICD-10-CM

## 2013-02-26 DIAGNOSIS — R61 Generalized hyperhidrosis: Secondary | ICD-10-CM

## 2013-02-26 DIAGNOSIS — R197 Diarrhea, unspecified: Secondary | ICD-10-CM

## 2013-02-26 DIAGNOSIS — W57XXXA Bitten or stung by nonvenomous insect and other nonvenomous arthropods, initial encounter: Secondary | ICD-10-CM

## 2013-02-26 LAB — CBC WITH DIFFERENTIAL/PLATELET
Basophils Absolute: 0 10*3/uL (ref 0.0–0.1)
Lymphocytes Relative: 28.9 % (ref 12.0–46.0)
Lymphs Abs: 1.2 10*3/uL (ref 0.7–4.0)
Monocytes Relative: 12.7 % — ABNORMAL HIGH (ref 3.0–12.0)
Neutrophils Relative %: 57.6 % (ref 43.0–77.0)
Platelets: 132 10*3/uL — ABNORMAL LOW (ref 150.0–400.0)
RDW: 12.8 % (ref 11.5–14.6)

## 2013-02-26 MED ORDER — DOXYCYCLINE HYCLATE 100 MG PO TABS: 100.0000 mg | ORAL_TABLET | Freq: Two times a day (BID) | ORAL | Status: DC

## 2013-02-26 NOTE — Patient Instructions (Addendum)
Verify you have activated the  My Chart system as lab & Xray results will be released directly  to you as soon as I review & address these through the computer. If you choose not to sign up for My Chart within 36 hours of labs being drawn; results will be reviewed & interpretation added before being copied & mailed, causing a delay in getting the results to you.If you do not receive that report within 7-10 days ,please call. Additionally you can use this system to gain direct  access to your records  if  out of town or @ an office of a  physician who is not in  the My Chart network.  This improves continuity of care & places you in control of your medical record.

## 2013-02-26 NOTE — Progress Notes (Signed)
Subjective:    Patient ID: Aaron Bradshaw, male    DOB: 1950-10-06, 62 y.o.   MRN: [id number]  HPI   Symptoms began 02/22/13 as malaise and one watery stool that evening. Over the period 8/2-8/4 he developed myalgias and arthralgias; pain in the posterior neck; and bilateral flank discomfort. He also had profuse night sweats. Nonsteroidals were of some benefit in reference to the myalgias and arthralgias. He did have night sweats last night as well. His temperature has not been above 98.3.  He's had some minor abdominal discomfort; he also had minor dysuria last night  He was at the beach prior to the onset of symptoms. His son and son-in-law had similar but milder symptoms. He denies ingestion of raw shellfish or exposure to contaminated food or beverages.  Six ticks removed this Summer; last tick exposure 2 weeks ago. 20 years ago he had a similar illness for which he was treated for tick borne fever.    Review of Systems He denies associated chills; there's been no rash during this illness.  He denies frontal headache, facial pain, nasal purulence, dental pain, sore throat, otic pain, otic discharge. He has had no significant cough or sputum production.  He denies hematuria or pyuria.  He's had no Coke-colored urine or clay colored stool.  His fasting blood sugar was 143 this morning; his last A1c was 6.4% on 12/19/12. Postprandial glucoses rarely been above 200     Objective:   Physical Exam Gen.: Healthy and well-nourished in appearance. Alert, appropriate and cooperative throughout exam.  Eyes: No corneal or conjunctival inflammation noted. No icterus. Ears: External  ear exam reveals no significant lesions or deformities. Canals clear .TMs normal. Hearing is grossly normal bilaterally. Nose: External nasal exam reveals no deformity or inflammation. Nasal mucosa are pink and moist. No lesions or exudates noted.  Mouth: Oral mucosa and oropharynx reveal no lesions or exudates.  Teeth in good repair. Neck: No deformities, masses, or tenderness noted. Range of motion normal; supple. Lungs: Normal respiratory effort; chest expands symmetrically. Lungs are clear to auscultation without rales, wheezes, or increased work of breathing. Heart: Normal rate and rhythm. Normal S1 and S2. No gallop, click, or rub. No murmur. Abdomen: Bowel sounds normal; abdomen soft and nontender. No masses, organomegaly or hernias noted.                                  Musculoskeletal/extremities: No deformity or scoliosis noted of  the thoracic or lumbar spine.  No clubbing, cyanosis, edema, or significant extremity  deformity noted. Range of motion normal .Tone & strength  Normal. Joints normal . Nail health good. Able to lie down & sit up w/o help. Negative SLR bilaterally. No meningismus Vascular: Carotid, radial artery, dorsalis pedis and  posterior tibial pulses are full and equal. No bruits present. Neurologic: Alert and oriented x3. Gait normal .      Skin: Intact without suspicious lesions or rashes. Lymph: No cervical, axillary lymphadenopathy present. Psych: Mood and affect are normal. Normally interactive  Assessment & Plan:  #1 indolent process associated with malaise, myalgias, arthralgias, profuse diaphoresis, self-limited diarrhea, minimal dysuria and flank/abdominal discomfort in the context of repeated tick exposure and similar illness in family recently with whom he has been in close contact. The overall clinical picture is of some improvement especially in relationship to the myalgias , arthralgias, and diarrhea.  Plan see orders recommendations

## 2013-02-27 LAB — POCT URINALYSIS DIPSTICK
Bilirubin, UA: NEGATIVE
Glucose, UA: NEGATIVE
Leukocytes, UA: NEGATIVE
Nitrite, UA: NEGATIVE
pH, UA: 6

## 2013-02-27 LAB — ROCKY MTN SPOTTED FVR AB, IGM-BLOOD: ROCKY MTN SPOTTED FEVER, IGM: 0.14 IV

## 2013-02-28 ENCOUNTER — Encounter: Payer: Self-pay | Admitting: Internal Medicine

## 2013-03-12 ENCOUNTER — Other Ambulatory Visit: Payer: Self-pay | Admitting: Dermatology

## 2013-03-15 ENCOUNTER — Ambulatory Visit: Payer: 59

## 2013-03-15 ENCOUNTER — Ambulatory Visit (INDEPENDENT_AMBULATORY_CARE_PROVIDER_SITE_OTHER): Payer: 59 | Admitting: Family Medicine

## 2013-03-15 VITALS — BP 142/84 | HR 60 | Temp 98.4°F | Wt 223.2 lb

## 2013-03-15 DIAGNOSIS — IMO0002 Reserved for concepts with insufficient information to code with codable children: Secondary | ICD-10-CM

## 2013-03-15 DIAGNOSIS — E1165 Type 2 diabetes mellitus with hyperglycemia: Secondary | ICD-10-CM

## 2013-03-15 DIAGNOSIS — F410 Panic disorder [episodic paroxysmal anxiety] without agoraphobia: Secondary | ICD-10-CM

## 2013-03-15 DIAGNOSIS — E119 Type 2 diabetes mellitus without complications: Secondary | ICD-10-CM

## 2013-03-15 DIAGNOSIS — Z0000 Encounter for general adult medical examination without abnormal findings: Secondary | ICD-10-CM

## 2013-03-15 DIAGNOSIS — R61 Generalized hyperhidrosis: Secondary | ICD-10-CM

## 2013-03-15 LAB — BASIC METABOLIC PANEL
Calcium: 9.1 mg/dL (ref 8.4–10.5)
GFR: 86.61 mL/min (ref >60.00)
Glucose, Bld: 135 mg/dL — ABNORMAL HIGH (ref 70–99)
Sodium: 138 mEq/L (ref 135–145)

## 2013-03-15 LAB — CBC WITH DIFFERENTIAL/PLATELET
Basophils Absolute: 0 10*3/uL (ref 0.0–0.1)
Eosinophils Absolute: 0 10*3/uL (ref 0.0–0.7)
Hemoglobin: 15 g/dL (ref 13.0–17.0)
Lymphocytes Relative: 71.5 % — ABNORMAL HIGH (ref 12.0–46.0)
MCHC: 33.9 g/dL (ref 30.0–36.0)
Monocytes Relative: 6.8 % (ref 3.0–12.0)
Neutrophils Relative %: 21.2 % — ABNORMAL LOW (ref 43.0–77.0)
Platelets: 164 10*3/uL (ref 150.0–400.0)
RDW: 12.9 % (ref 11.5–14.6)

## 2013-03-15 LAB — POCT URINALYSIS DIPSTICK
Blood, UA: NEGATIVE
Glucose, UA: NEGATIVE
Nitrite, UA: NEGATIVE
Protein, UA: NEGATIVE
Spec Grav, UA: 1.02
Urobilinogen, UA: 0.2
pH, UA: 6

## 2013-03-15 LAB — MICROALBUMIN / CREATININE URINE RATIO
Creatinine,U: 176.6 mg/dL
Microalb Creat Ratio: 0.6 mg/g (ref 0.0–30.0)
Microalb, Ur: 1.1 mg/dL (ref 0.0–1.9)

## 2013-03-15 LAB — HEMOGLOBIN A1C: Hgb A1c MFr Bld: 6.9 % — ABNORMAL HIGH (ref 4.6–6.5)

## 2013-03-15 MED ORDER — ALPRAZOLAM 0.25 MG PO TABS: 0.2500 mg | ORAL_TABLET | Freq: Two times a day (BID) | ORAL | Status: DC | PRN

## 2013-03-15 MED ORDER — METFORMIN HCL ER 500 MG PO TB24: 500.0000 mg | ORAL_TABLET | Freq: Every day | ORAL | Status: DC

## 2013-03-15 NOTE — Patient Instructions (Addendum)
Anxiety and Panic Attacks Your caregiver has informed you that you are having an anxiety or panic attack. There may be many forms of this. Most of the time these attacks come suddenly and without warning. They come at any time of day, including periods of sleep, and at any time of life. They may be strong and unexplained. Although panic attacks are very scary, they are physically harmless. Sometimes the cause of your anxiety is not known. Anxiety is a protective mechanism of the body in its fight or flight mechanism. Most of these perceived danger situations are actually nonphysical situations (such as anxiety over losing a job). CAUSES  The causes of an anxiety or panic attack are many. Panic attacks may occur in otherwise healthy people given a certain set of circumstances. There may be a genetic cause for panic attacks. Some medications may also have anxiety as a side effect. SYMPTOMS  Some of the most common feelings are:  Intense terror.  Dizziness, feeling faint.  Hot and cold flashes.  Fear of going crazy.  Feelings that nothing is real.  Sweating.  Shaking.  Chest pain or a fast heartbeat (palpitations).  Smothering, choking sensations.  Feelings of impending doom and that death is near.  Tingling of extremities, this may be from over-breathing.  Altered reality (derealization).  Being detached from yourself (depersonalization). Several symptoms can be present to make up anxiety or panic attacks. DIAGNOSIS  The evaluation by your caregiver will depend on the type of symptoms you are experiencing. The diagnosis of anxiety or panic attack is made when no physical illness can be determined to be a cause of the symptoms. TREATMENT  Treatment to prevent anxiety and panic attacks may include:  Avoidance of circumstances that cause anxiety.  Reassurance and relaxation.  Regular exercise.  Relaxation therapies, such as yoga.  Psychotherapy with a psychiatrist or  therapist.  Avoidance of caffeine, alcohol and illegal drugs.  Prescribed medication. SEEK IMMEDIATE MEDICAL CARE IF:   You experience panic attack symptoms that are different than your usual symptoms.  You have any worsening or concerning symptoms. Document Released: 07/11/2005 Document Revised: 10/03/2011 Document Reviewed: 11/12/2009 [facility] Patient Information 2014 [place], [place].    Diabetes and Standards of Medical Care  Diabetes is complicated. You may find that your diabetes team includes a dietitian, nurse, diabetes educator, eye doctor, and more. To help everyone know what is going on and to help you get the care you deserve, the following schedule of care was developed to help keep you on track. Below are the tests, exams, vaccines, medicines, education, and plans you will need. A1c test  Performed at least 2 times a year if you are meeting treatment goals.  Performed 4 times a year if therapy has changed or if you are not meeting treatment goals. Blood pressure test  Performed at every routine medical visit. The goal is less than 120/80 mmHg. Dental exam  Follow up with the dentist regularly. Eye exam  Diagnosed with type 1 diabetes as a child: Get an exam upon reaching the age of 10 years or older and having had diabetes for 3 5 years. Yearly eye exams are recommended after that initial eye exam.  Diagnosed with type 1 diabetes as an adult: Get an exam within 5 years of diagnosis and then yearly.  Diagnosed with type 2 diabetes: Get an exam as soon as possible after the diagnosis and then yearly. Foot care exam  Visual foot exams are performed at every routine  medical visit. The exams check for cuts, injuries, or other problems with the feet.  A comprehensive foot exam should be done yearly. This includes visual inspection as well as assessing foot pulses and testing for loss of sensation. Kidney function test (urine microalbumin)  Performed once a  year.  Type 1 diabetes: The first test is performed 5 years after diagnosis.  Type 2 diabetes: The first test is performed at the time of diagnosis.  A serum creatinine and estimated glomerular filtration rate (eGFR) test is done once a year to tell the level of chronic kidney disease (CKD), if present. Lipid profile (Cholesterol, HDL, LDL, Triglycerides)  Performed every 5 years for most people.  The goal for LDL is less than 100 mg/dl. If at high risk, the goal is less than 70 mg/dl.  The goal for HDL is 40 mg/dl 50 mg/dl for men and 50 mg/dl 60 mg/dl for women. An HDL cholesterol of 60 mg/dL or higher gives some protection against heart disease.  The goal for triglycerides is less than 150 mg/dl. Influenza vaccine, pneumococcal vaccine, and hepatitis B vaccine  The influenza vaccine is recommended yearly.  The pneumococcal vaccine is generally given once in a lifetime. However, there are some instances when another vaccination is recommended. Check with your caregiver.  The hepatitis B vaccine is also recommended for adults with diabetes. Diabetes self-management education  Recommended at diagnosis and ongoing as needed. Treatment plan  Reviewed at every medical visit. Document Released: 05/08/2009 Document Revised: 06/27/2012 Document Reviewed: 01/11/2011 [facility] Patient Information 2014 [place], [place].

## 2013-03-16 ENCOUNTER — Encounter: Payer: Self-pay | Admitting: Family Medicine

## 2013-03-16 DIAGNOSIS — R61 Generalized hyperhidrosis: Secondary | ICD-10-CM | POA: Insufficient documentation

## 2013-03-16 NOTE — Progress Notes (Signed)
  Subjective:    Patient ID: Aaron Bradshaw, male    DOB: 15-Oct-1950, 62 y.o.   MRN: [id number]  HPI Pt here c/o hot sweats.  He thinks it is his anxiety but his blood sugars are running high (mid 200s) as well.   Pt was taken off metformin a while ago.  No other complaints.    Review of Systems    as above Objective:   Physical Exam  BP 142/84  Pulse 60  Temp(Src) 98.4 F (36.9 C) (Oral)  Wt 223 lb 3.2 oz (101.243 kg)  BMI 27.9 kg/m2  SpO2 98% General appearance: alert, cooperative, appears stated age and no distress Nose: Nares normal. Septum midline. Mucosa normal. No drainage or sinus tenderness. Throat: lips, mucosa, and tongue normal; teeth and gums normal Neck: no adenopathy, no carotid bruit, no JVD, supple, symmetrical, trachea midline and thyroid not enlarged, symmetric, no tenderness/mass/nodules Lungs: clear to auscultation bilaterally Heart: regular rate and rhythm, S1, S2 normal, no murmur, click, rub or gallop Extremities: extremities normal, atraumatic, no cyanosis or edema Neurologic: Alert and oriented X 3, normal strength and tone. Normal symmetric reflexes. Normal coordination and gait Sensory exam of the foot is normal, tested with the monofilament. Good pulses, no lesions or ulcers, good peripheral pulses.       Assessment & Plan:

## 2013-03-16 NOTE — Assessment & Plan Note (Signed)
Restart metformin Check labs

## 2013-03-16 NOTE — Assessment & Plan Note (Signed)
May be from hyperglycemia and anxiety may be contributing=---- restart metformin F/u pcp 3 months for labs or sooner prn

## 2013-03-18 ENCOUNTER — Telehealth: Payer: Self-pay | Admitting: *Deleted

## 2013-03-18 DIAGNOSIS — D7289 Other specified disorders of white blood cells: Secondary | ICD-10-CM

## 2013-03-18 NOTE — Telephone Encounter (Addendum)
Pt came in to discuss recent lab results.  Informed the pt that the smear came back and it showed infection or virus etc.  Informed the pt that Dr. Laury Axon would to repeat CBC w/ Diff in 6 weeks.    Pt understood and agreed.  Pt was scheduled a future lab appt and future labs ordered and sent.//AB/CMA

## 2013-03-26 ENCOUNTER — Telehealth: Payer: Self-pay | Admitting: Internal Medicine

## 2013-03-26 NOTE — Telephone Encounter (Signed)
PT is requesting to switch PCP from Dr. Alwyn Ren to Dr. Caryl Never. May I schedule this? Thank you!

## 2013-03-26 NOTE — Telephone Encounter (Signed)
OK with me.

## 2013-03-28 ENCOUNTER — Telehealth: Payer: Self-pay | Admitting: Internal Medicine

## 2013-03-28 NOTE — Telephone Encounter (Signed)
Message copied by Kathyrn Drown on Thu Mar 28, 2013  1:58 PM ------      Message from: Kristian Covey      Created: Thu Mar 28, 2013 12:49 PM         [place],            Can you get this pt scheduled for follow up here?  He is transferring from [facility].  Thanks!      ----- Message -----         From: Pecola Lawless, MD         Sent: 03/28/2013  10:53 AM           To: Kristian Covey, MD            Bruce,please have your staff set up F/U with you ; thanks. Hopp       ------

## 2013-03-28 NOTE — Telephone Encounter (Signed)
Called patient to make appt. LMTCB.

## 2013-04-10 ENCOUNTER — Other Ambulatory Visit: Payer: Self-pay | Admitting: Dermatology

## 2013-04-17 ENCOUNTER — Ambulatory Visit (INDEPENDENT_AMBULATORY_CARE_PROVIDER_SITE_OTHER): Payer: 59 | Admitting: Family Medicine

## 2013-04-17 ENCOUNTER — Encounter: Payer: Self-pay | Admitting: Family Medicine

## 2013-04-17 VITALS — BP 118/70 | HR 65 | Temp 98.1°F | Ht 75.0 in | Wt 228.0 lb

## 2013-04-17 DIAGNOSIS — E119 Type 2 diabetes mellitus without complications: Secondary | ICD-10-CM

## 2013-04-17 DIAGNOSIS — Z23 Encounter for immunization: Secondary | ICD-10-CM

## 2013-04-17 DIAGNOSIS — G4700 Insomnia, unspecified: Secondary | ICD-10-CM

## 2013-04-17 MED ORDER — RAMELTEON 8 MG PO TABS: 8.0000 mg | ORAL_TABLET | Freq: Every day | ORAL | Status: DC

## 2013-04-17 NOTE — Patient Instructions (Signed)

## 2013-04-17 NOTE — Progress Notes (Signed)
  Subjective:    Patient ID: Aaron Bradshaw, male    DOB: 05/06/1951, 62 y.o.   MRN: [id number]  HPI  Patient seen to establish care History of type 2 diabetes and chronic insomnia.  Diabetes treated with metformin extended release 500 mg once daily. Most recent A1c 6.9%. Fasting blood sugars fairly consistently less than 130. No symptoms of hyperglycemia. He exercises regularly. He has seen slight trend upward in blood sugars over the past year  Patient has history of chronic insomnia. He tried Xanax once but did not like the way it made him feel. No caffeine use. No alcohol use. He also complains of some chronic anxiety. Questionable history of panic attacks. Previously took Prozac but had side effects. His anxiety symptoms are fairly pervasive and generalized. Never diagnosed with specific anxiety disorder.  Patient is retired from [employer]. He does Soil scientist. Nonsmoker. No alcohol use. Needs flu vaccine.  Past Medical History  Diagnosis Date  . History of chest pain     Hospitalized ER in [place] for CP and Palpitations with Neg Enzymes  . Gilbert syndrome   . Testosterone deficiency     Dr Marcello Fennel  . Diabetes mellitus 6/12    A1c 7.1 %   . Hyperlipidemia    Past Surgical History  Procedure Laterality Date  . Knee arthroscopy      Left  . Shoulder arthroscopy    . Nose surgery      Dr  Nedra Hai  . Colonoscopy      Tics, Polyp 12/26/2003; polyps 2010, Dr Leone Payor  . Cholecystectomy  2010    reports that he has never smoked. He has never used smokeless tobacco. He reports that  drinks alcohol. He reports that he does not use illicit drugs. family history includes Arthritis in his mother; Depression in his father, mother, and sister; Diabetes in his maternal grandfather and mother; Heart disease in his father; OCD in his father; Parkinsonism in his mother; Prostate cancer in his father and paternal grandfather; Stroke (age of onset: 63) in his mother. Allergies   Allergen Reactions  . Codeine     REACTION: nausea  . Prozac [Fluoxetine Hcl]     Increased anxiety     Review of Systems  Constitutional: Negative for fatigue.  Eyes: Negative for visual disturbance.  Respiratory: Negative for cough, chest tightness and shortness of breath.   Cardiovascular: Negative for chest pain, palpitations and leg swelling.  Neurological: Negative for dizziness, syncope, weakness, light-headedness and headaches.  Psychiatric/Behavioral: Positive for sleep disturbance. Negative for dysphoric mood. The patient is nervous/anxious.        Objective:   Physical Exam  Constitutional: He appears well-developed and well-nourished.  Neck: Neck supple. No thyromegaly present.  Cardiovascular: Normal rate and regular rhythm.   Pulmonary/Chest: Effort normal and breath sounds normal. No respiratory distress. He has no wheezes. He has no rales.  Musculoskeletal: He exhibits no edema.  Psychiatric: He has a normal mood and affect. His behavior is normal.          Assessment & Plan:  #1 type 2 diabetes. History of fair control. Repeat A1c at followup #2 chronic insomnia. Sleep hygiene discussed. Trial of Rozerem 8 mg each bedtime when necessary #3 chronic anxiety. We discussed possible treatment options such as sertraline but at this point he wishes to observe. Discussed things like counseling and exercise

## 2013-04-24 ENCOUNTER — Other Ambulatory Visit: Payer: Self-pay | Admitting: Internal Medicine

## 2013-04-24 ENCOUNTER — Other Ambulatory Visit: Payer: Self-pay | Admitting: Dermatology

## 2013-04-25 NOTE — Telephone Encounter (Signed)
Rx sent to the pharmacy by e-script.//AB/CMA 

## 2013-04-26 ENCOUNTER — Other Ambulatory Visit: Payer: Self-pay

## 2013-04-26 MED ORDER — GLUCOSE BLOOD VI STRP: ORAL_STRIP | Status: DC

## 2013-04-29 ENCOUNTER — Other Ambulatory Visit: Payer: 59

## 2013-05-16 ENCOUNTER — Ambulatory Visit (INDEPENDENT_AMBULATORY_CARE_PROVIDER_SITE_OTHER): Payer: 59 | Admitting: Family Medicine

## 2013-05-16 ENCOUNTER — Encounter: Payer: Self-pay | Admitting: Family Medicine

## 2013-05-16 VITALS — BP 128/82 | HR 65 | Temp 98.2°F | Wt 230.0 lb

## 2013-05-16 DIAGNOSIS — G4700 Insomnia, unspecified: Secondary | ICD-10-CM

## 2013-05-16 DIAGNOSIS — Z23 Encounter for immunization: Secondary | ICD-10-CM

## 2013-05-16 DIAGNOSIS — E119 Type 2 diabetes mellitus without complications: Secondary | ICD-10-CM

## 2013-05-16 MED ORDER — GLUCOSE BLOOD VI STRP: ORAL_STRIP | Status: DC

## 2013-05-16 NOTE — Progress Notes (Signed)
  Subjective:    Patient ID: Aaron Bradshaw, male    DOB: 20-Feb-1951, 62 y.o.   MRN: [id number]  HPI Patient here for followup regarding insomnia and anxiety issues. Refer to last note. We started Rozerem but he states that made him feel" loopy". He is currently taking Benadryl 25 to 50 mg at night which seems to working well.  Type 2 diabetes. Has been on metformin 500 mg daily. Took himself off 10 days ago because (with recent lifestyle changes) thought he could manage without this. His blood sugars been stable fastings consistently run 120 since then. He is exercising most days of week. Last A1c 6.9%. Patient has had flu vaccine but no history of Pneumovax.  Past Medical History  Diagnosis Date  . History of chest pain     Hospitalized ER in [place] for CP and Palpitations with Neg Enzymes  . Gilbert syndrome   . Testosterone deficiency     Dr Marcello Fennel  . Diabetes mellitus 6/12    A1c 7.1 %   . Hyperlipidemia    Past Surgical History  Procedure Laterality Date  . Knee arthroscopy      Left  . Shoulder arthroscopy    . Nose surgery      Dr  Nedra Hai  . Colonoscopy      Tics, Polyp 12/26/2003; polyps 2010, Dr Leone Payor  . Cholecystectomy  2010    reports that he has never smoked. He has never used smokeless tobacco. He reports that he drinks alcohol. He reports that he does not use illicit drugs. family history includes Arthritis in his mother; Depression in his father, mother, and sister; Diabetes in his maternal grandfather and mother; Heart disease in his father; OCD in his father; Parkinsonism in his mother; Prostate cancer in his father and paternal grandfather; Stroke (age of onset: 31) in his mother. Allergies  Allergen Reactions  . Codeine     REACTION: nausea  . Prozac [Fluoxetine Hcl]     Increased anxiety       Review of Systems  Constitutional: Negative for fatigue and unexpected weight change.  Eyes: Negative for visual disturbance.  Respiratory: Negative for  cough, chest tightness and shortness of breath.   Cardiovascular: Negative for chest pain, palpitations and leg swelling.  Endocrine: Negative for polydipsia and polyuria.  Genitourinary: Negative for dysuria.  Neurological: Negative for dizziness, syncope, weakness, light-headedness and headaches.       Objective:   Physical Exam  Constitutional: He appears well-developed and well-nourished.  Neck: Neck supple.  Cardiovascular: Normal rate and regular rhythm.   Pulmonary/Chest: Effort normal and breath sounds normal. No respiratory distress. He has no wheezes. He has no rales.  Musculoskeletal: He exhibits no edema.  Psychiatric: He has a normal mood and affect. His behavior is normal.          Assessment & Plan:  #1 type 2 diabetes. History of good control. Currently off metformin. Recheck A1c in 2 months. Start back metformin at that point if not to goal  #2 chronic insomnia. Sleep hygiene discussed. Repeat trial of Rozerem.  If not working he'll go back to Benadryl. #3 intermittent anxiety symptoms. These are very transient and occur more at night. We have talked about prophylaxis with SSRI medication and at this point he wishes to hold.

## 2013-05-30 ENCOUNTER — Ambulatory Visit (INDEPENDENT_AMBULATORY_CARE_PROVIDER_SITE_OTHER): Payer: 59 | Admitting: Family Medicine

## 2013-05-30 ENCOUNTER — Encounter: Payer: Self-pay | Admitting: Family Medicine

## 2013-05-30 VITALS — BP 128/80 | HR 62 | Temp 98.0°F | Wt 228.0 lb

## 2013-05-30 DIAGNOSIS — J209 Acute bronchitis, unspecified: Secondary | ICD-10-CM

## 2013-05-30 NOTE — Progress Notes (Signed)
  Subjective:    Patient ID: Aaron Bradshaw, male    DOB: [birth year], 62 y.o.   MRN: [id number]  HPI Acute visit Patient seen with acute respiratory illness. Several family members have had similar symptoms. Onset last weekend. He developed rhinorrhea, malaise, bodyaches, increased night sweats, mostly nonproductive cough. He thinks he may have some low-grade fever last weekend but none since then. No dyspnea. Blood sugars been slightly more elevated since onset with fasting blood sugars around 145. Denies any nausea or vomiting. Using NyQuil which has helped relieve nighttime cough symptoms  Past Medical History  Diagnosis Date  . History of chest pain     Hospitalized ER in [place] for CP and Palpitations with Neg Enzymes  . Gilbert syndrome   . Testosterone deficiency     Dr Marcello Fennel  . Diabetes mellitus 6/12    A1c 7.1 %   . Hyperlipidemia    Past Surgical History  Procedure Laterality Date  . Knee arthroscopy      Left  . Shoulder arthroscopy    . Nose surgery      Dr  Nedra Hai  . Colonoscopy      Tics, Polyp 12/26/2003; polyps 2010, Dr Leone Payor  . Cholecystectomy  2010    reports that he has never smoked. He has never used smokeless tobacco. He reports that he drinks alcohol. He reports that he does not use illicit drugs. family history includes Arthritis in his mother; Depression in his father, mother, and sister; Diabetes in his maternal grandfather and mother; Heart disease in his father; OCD in his father; Parkinsonism in his mother; Prostate cancer in his father and paternal grandfather; Stroke (age of onset: 46) in his mother. Allergies  Allergen Reactions  . Codeine     REACTION: nausea  . Prozac [Fluoxetine Hcl]     Increased anxiety      Review of Systems  Constitutional: Positive for chills and fatigue. Negative for fever.  HENT: Negative for ear pain and sore throat.   Respiratory: Positive for cough. Negative for shortness of breath and wheezing.         Objective:   Physical Exam  Constitutional: He appears well-developed and well-nourished. No distress.  HENT:  Right Ear: External ear normal.  Left Ear: External ear normal.  Mouth/Throat: Oropharynx is clear and moist.  Neck: Neck supple.  Cardiovascular: Normal rate and regular rhythm.   Pulmonary/Chest: Effort normal and breath sounds normal. No respiratory distress. He has no wheezes. He has no rales.  Lymphadenopathy:    He has no cervical adenopathy.          Assessment & Plan:  Acute bronchitis. Suspect viral. Reassurance. Continue NyQuil for nighttime relief. No indication for antibiotics at this time.

## 2013-05-30 NOTE — Patient Instructions (Signed)
Acute Bronchitis Bronchitis is inflammation of the airways that extend from the windpipe into the lungs (bronchi). The inflammation often causes mucus to develop. This leads to a cough, which is the most common symptom of bronchitis.  In acute bronchitis, the condition usually develops suddenly and goes away over time, usually in a couple weeks. Smoking, allergies, and asthma can make bronchitis worse. Repeated episodes of bronchitis may cause further lung problems.  CAUSES Acute bronchitis is most often caused by the same virus that causes a cold. The virus can spread from person to person (contagious).  SIGNS AND SYMPTOMS   Cough.   Fever.   Coughing up mucus.   Body aches.   Chest congestion.   Chills.   Shortness of breath.   Sore throat.  DIAGNOSIS  Acute bronchitis is usually diagnosed through a physical exam. Tests, such as chest X-rays, are sometimes done to rule out other conditions.  TREATMENT  Acute bronchitis usually goes away in a couple weeks. Often times, no medical treatment is necessary. Medicines are sometimes given for relief of fever or cough. Antibiotics are usually not needed but may be prescribed in certain situations. In some cases, an inhaler may be recommended to help reduce shortness of breath and control the cough. A cool mist vaporizer may also be used to help thin bronchial secretions and make it easier to clear the chest.  HOME CARE INSTRUCTIONS  Get plenty of rest.   Drink enough fluids to keep your urine clear or pale yellow (unless you have a medical condition that requires fluid restriction). Increasing fluids may help thin your secretions and will prevent dehydration.   Only take over-the-counter or prescription medicines as directed by your health care provider.   Avoid smoking and secondhand smoke. Exposure to cigarette smoke or irritating chemicals will make bronchitis worse. If you are a smoker, consider using nicotine gum or skin  patches to help control withdrawal symptoms. Quitting smoking will help your lungs heal faster.   Reduce the chances of another bout of acute bronchitis by washing your hands frequently, avoiding people with cold symptoms, and trying not to touch your hands to your mouth, nose, or eyes.   Follow up with your health care provider as directed.  SEEK MEDICAL CARE IF: Your symptoms do not improve after 1 week of treatment.  SEEK IMMEDIATE MEDICAL CARE IF:  You develop an increased fever or chills.   You have chest pain.   You have severe shortness of breath.  You have bloody sputum.   You develop dehydration.  You develop fainting.  You develop repeated vomiting.  You develop a severe headache. MAKE SURE YOU:   Understand these instructions.  Will watch your condition.  Will get help right away if you are not doing well or get worse. Document Released: 08/18/2004 Document Revised: 03/13/2013 Document Reviewed: 01/01/2013 [employer] Patient Information 2014 [employer].  

## 2013-06-28 ENCOUNTER — Other Ambulatory Visit: Payer: 59

## 2013-07-05 ENCOUNTER — Ambulatory Visit: Payer: 59 | Admitting: Internal Medicine

## 2013-08-14 ENCOUNTER — Encounter: Payer: Self-pay | Admitting: Family Medicine

## 2013-08-14 ENCOUNTER — Ambulatory Visit (INDEPENDENT_AMBULATORY_CARE_PROVIDER_SITE_OTHER): Payer: 59 | Admitting: Family Medicine

## 2013-08-14 VITALS — BP 124/68 | HR 57 | Temp 98.4°F | Wt 231.0 lb

## 2013-08-14 DIAGNOSIS — B029 Zoster without complications: Secondary | ICD-10-CM

## 2013-08-14 DIAGNOSIS — E119 Type 2 diabetes mellitus without complications: Secondary | ICD-10-CM

## 2013-08-14 LAB — HEMOGLOBIN A1C: Hgb A1c MFr Bld: 6.9 % — ABNORMAL HIGH (ref 4.6–6.5)

## 2013-08-14 NOTE — Patient Instructions (Signed)
Shingles Shingles (herpes zoster) is an infection that is caused by the same virus that causes chickenpox (varicella). The infection causes a painful skin rash and fluid-filled blisters, which eventually break open, crust over, and heal. It may occur in any area of the body, but it usually affects only one side of the body or face. The pain of shingles usually lasts about 1 month. However, some people with shingles may develop long-term (chronic) pain in the affected area of the body. Shingles often occurs many years after the person had chickenpox. It is more common:  In people older than 50 years.  In people with weakened immune systems, such as those with HIV, AIDS, or cancer.  In people taking medicines that weaken the immune system, such as transplant medicines.  In people under great stress. CAUSES  Shingles is caused by the varicella zoster virus (VZV), which also causes chickenpox. After a person is infected with the virus, it can remain in the person's body for years in an inactive state (dormant). To cause shingles, the virus reactivates and breaks out as an infection in a nerve root. The virus can be spread from person to person (contagious) through contact with open blisters of the shingles rash. It will only spread to people who have not had chickenpox. When these people are exposed to the virus, they may develop chickenpox. They will not develop shingles. Once the blisters scab over, the person is no longer contagious and cannot spread the virus to others. SYMPTOMS  Shingles shows up in stages. The initial symptoms may be pain, itching, and tingling in an area of the skin. This pain is usually described as burning, stabbing, or throbbing.In a few days or weeks, a painful red rash will appear in the area where the pain, itching, and tingling were felt. The rash is usually on one side of the body in a band or belt-like pattern. Then, the rash usually turns into fluid-filled blisters. They  will scab over and dry up in approximately 2 3 weeks. Flu-like symptoms may also occur with the initial symptoms, the rash, or the blisters. These may include:  Fever.  Chills.  Headache.  Upset stomach. DIAGNOSIS  Your caregiver will perform a skin exam to diagnose shingles. Skin scrapings or fluid samples may also be taken from the blisters. This sample will be examined under a microscope or sent to a lab for further testing. TREATMENT  There is no specific cure for shingles. Your caregiver will likely prescribe medicines to help you manage the pain, recover faster, and avoid long-term problems. This may include antiviral drugs, anti-inflammatory drugs, and pain medicines. HOME CARE INSTRUCTIONS   Take a cool bath or apply cool compresses to the area of the rash or blisters as directed. This may help with the pain and itching.   Only take over-the-counter or prescription medicines as directed by your caregiver.   Rest as directed by your caregiver.  Keep your rash and blisters clean with mild soap and cool water or as directed by your caregiver.  Do not pick your blisters or scratch your rash. Apply an anti-itch cream or numbing creams to the affected area as directed by your caregiver.  Keep your shingles rash covered with a loose bandage (dressing).  Avoid skin contact with:  Babies.   Pregnant women.   Children with eczema.   Elderly people with transplants.   People with chronic illnesses, such as leukemia or AIDS.   Wear loose-fitting clothing to help ease   the pain of material rubbing against the rash.  Keep all follow-up appointments with your caregiver.If the area involved is on your face, you may receive a referral for follow-up to a specialist, such as an eye doctor (ophthalmologist) or an ear, nose, and throat (ENT) doctor. Keeping all follow-up appointments will help you avoid eye complications, chronic pain, or disability.  SEEK IMMEDIATE MEDICAL  CARE IF:   You have facial pain, pain around the eye area, or loss of feeling on one side of your face.  You have ear pain or ringing in your ear.  You have loss of taste.  Your pain is not relieved with prescribed medicines.   Your redness or swelling spreads.   You have more pain and swelling.  Your condition is worsening or has changed.   You have a feveror persistent symptoms for more than 2 3 days.  You have a fever and your symptoms suddenly get worse. MAKE SURE YOU:  Understand these instructions.  Will watch your condition.  Will get help right away if you are not doing well or get worse. Document Released: 07/11/2005 Document Revised: 04/04/2012 Document Reviewed: 02/23/2012 [employer] Patient Information 2014 [employer].  

## 2013-08-14 NOTE — Progress Notes (Signed)
Pre visit review using our clinic review tool, if applicable. No additional management support is needed unless otherwise documented below in the visit note. 

## 2013-08-14 NOTE — Progress Notes (Signed)
   Subjective:    Patient ID: Aaron Bradshaw, male    DOB: 1950/12/27, 63 y.o.   MRN: [id number]  HPI Followup type 2 diabetes. Currently has been controlled off medication. Diligently exercises with swimming and weights. Blood sugars recently fasting run 130. No symptoms of hyperglycemia. A1c's have been well controlled. Last A1c 6.9%.  Shingles involving right thoracic area. Moderately painful. Saw dermatologist. Treated with Valtrex. He is tolerating without pain medications does not wish to take any medications at this time.  Past Medical History  Diagnosis Date  . History of chest pain     Hospitalized ER in [place] for CP and Palpitations with Neg Enzymes  . Gilbert syndrome   . Testosterone deficiency     Dr Hartley Barefoot  . Diabetes mellitus 6/12    A1c 7.1 %   . Hyperlipidemia    Past Surgical History  Procedure Laterality Date  . Knee arthroscopy      Left  . Shoulder arthroscopy    . Nose surgery      Dr  Truman Hayward  . Colonoscopy      Tics, Polyp 12/26/2003; polyps 2010, Dr Carlean Purl  . Cholecystectomy  2010    reports that he has never smoked. He has never used smokeless tobacco. He reports that he drinks alcohol. He reports that he does not use illicit drugs. family history includes Arthritis in his mother; Depression in his father, mother, and sister; Diabetes in his maternal grandfather and mother; Heart disease in his father; OCD in his father; Parkinsonism in his mother; Prostate cancer in his father and paternal grandfather; Stroke (age of onset: 23) in his mother. Allergies  Allergen Reactions  . Codeine     REACTION: nausea  . Prozac [Fluoxetine Hcl]     Increased anxiety      Review of Systems  Constitutional: Negative for fatigue.  Eyes: Negative for visual disturbance.  Respiratory: Negative for cough, chest tightness and shortness of breath.   Cardiovascular: Negative for chest pain, palpitations and leg swelling.  Neurological: Negative for dizziness,  syncope, weakness, light-headedness and headaches.       Objective:   Physical Exam  Constitutional: He appears well-developed and well-nourished.  Cardiovascular: Normal rate and regular rhythm.  Exam reveals no gallop.   No murmur heard. Pulmonary/Chest: Effort normal and breath sounds normal. No respiratory distress. He has no wheezes. He has no rales.  Musculoskeletal: He exhibits no edema.  Skin:  Feet reveal no skin lesions. Good distal foot pulses. Good capillary refill. No calluses. Normal sensation with monofilament testing   Patient has vesicular rash right thoracic region dermatome distribution.          Assessment & Plan:  #1 type 2 diabetes  History of good control. Repeat A1c. Routine followup 6 months. Continue regular exercise habits #2 shingles. Right thoracic distribution. Controlled without pain medication currently

## 2013-08-16 ENCOUNTER — Telehealth: Payer: Self-pay

## 2013-08-16 NOTE — Telephone Encounter (Signed)
Relevant patient education assigned to patient using Emmi. ° °

## 2013-10-23 DIAGNOSIS — G373 Acute transverse myelitis in demyelinating disease of central nervous system: Secondary | ICD-10-CM

## 2013-10-23 HISTORY — DX: Acute transverse myelitis in demyelinating disease of central nervous system: G37.3

## 2013-11-08 ENCOUNTER — Encounter: Payer: Self-pay | Admitting: Family Medicine

## 2013-11-08 ENCOUNTER — Ambulatory Visit (INDEPENDENT_AMBULATORY_CARE_PROVIDER_SITE_OTHER): Payer: 59 | Admitting: Family Medicine

## 2013-11-08 VITALS — BP 126/76 | HR 76 | Temp 98.3°F | Wt 233.0 lb

## 2013-11-08 DIAGNOSIS — R209 Unspecified disturbances of skin sensation: Secondary | ICD-10-CM

## 2013-11-08 DIAGNOSIS — R202 Paresthesia of skin: Secondary | ICD-10-CM

## 2013-11-08 DIAGNOSIS — E119 Type 2 diabetes mellitus without complications: Secondary | ICD-10-CM

## 2013-11-08 NOTE — Patient Instructions (Signed)
Touch base by early next week if symptoms persist. Follow up sooner for any weakness or increasing pain.

## 2013-11-08 NOTE — Progress Notes (Signed)
Pre visit review using our clinic review tool, if applicable. No additional management support is needed unless otherwise documented below in the visit note. 

## 2013-11-08 NOTE — Progress Notes (Signed)
   Subjective:    Patient ID: Aaron Bradshaw, male    DOB: 1951-04-09, 63 y.o.   MRN: [id number]  HPI  Patient seen with rather acute onset yesterday morning when he woke up of bilateral foot paresthesias with extension to lower legs bilaterally. Very symmetric distribution. He felt like his legs were swollen but not seeing edema. His symptoms persisted today and he went to chiropractor this morning. Some type of an" adjustment". No x-rays. He's not any low back pain. No weakness. No upper extremity symptoms.  He has type 2 diabetes which is well-controlled with last A1c 6.9%. He still exercised yesterday with swimming without difficulty. No pain with ambulation. Minimal if any leg pain. No history of neuropathy. Denies any loss of urine or stool control  Past Medical History  Diagnosis Date  . History of chest pain     Hospitalized ER in [place] for CP and Palpitations with Neg Enzymes  . Gilbert syndrome   . Testosterone deficiency     Dr Hartley Barefoot  . Diabetes mellitus 6/12    A1c 7.1 %   . Hyperlipidemia    Past Surgical History  Procedure Laterality Date  . Knee arthroscopy      Left  . Shoulder arthroscopy    . Nose surgery      Dr  Truman Hayward  . Colonoscopy      Tics, Polyp 12/26/2003; polyps 2010, Dr Carlean Purl  . Cholecystectomy  2010    reports that he has never smoked. He has never used smokeless tobacco. He reports that he drinks alcohol. He reports that he does not use illicit drugs. family history includes Arthritis in his mother; Depression in his father, mother, and sister; Diabetes in his maternal grandfather and mother; Heart disease in his father; OCD in his father; Parkinsonism in his mother; Prostate cancer in his father and paternal grandfather; Stroke (age of onset: 85) in his mother. Allergies  Allergen Reactions  . Codeine     REACTION: nausea  . Prozac [Fluoxetine Hcl]     Increased anxiety      Review of Systems  Constitutional: Negative for fatigue.    Eyes: Negative for visual disturbance.  Respiratory: Negative for cough, chest tightness and shortness of breath.   Cardiovascular: Negative for chest pain, palpitations and leg swelling.  Neurological: Positive for numbness. Negative for dizziness, syncope, weakness, light-headedness and headaches.       Objective:   Physical Exam  Constitutional: He appears well-developed and well-nourished.  Cardiovascular: Normal rate and regular rhythm.   Pulmonary/Chest: Effort normal and breath sounds normal. No respiratory distress. He has no wheezes. He has no rales.  Musculoskeletal: He exhibits no edema.  Feet are warm to touch with 2+ dorsalis pedis and posterior to pulses bilaterally. Excellent capillary refill. No edema  Neurological:  Has normal sensory function with monofilament testing involving both feet and lower legs. He has full-strength lower extremities throughout. He has symmetric 1+ reflexes knee and ankle bilaterally.          Assessment & Plan:  Bilateral leg and foot paresthesias. Nonfocal exam. This would not be typical of metabolic neuropathy as he has had well-controlled diabetes and symptoms appeared very suddenly. Recent blood sugars have been relatively stable. Has not had any back pain to suggest acute central disc herniation. Observe for now. Consider MRI lumbar spine if persisting into next week

## 2013-11-09 ENCOUNTER — Emergency Department (HOSPITAL_COMMUNITY)
Admission: EM | Admit: 2013-11-09 | Discharge: 2013-11-09 | Disposition: A | Discharge status: Home / Self Care | Payer: 59 | Attending: Emergency Medicine | Admitting: Emergency Medicine

## 2013-11-09 ENCOUNTER — Emergency Department (HOSPITAL_COMMUNITY): Payer: 59

## 2013-11-09 DIAGNOSIS — R200 Anesthesia of skin: Secondary | ICD-10-CM | POA: Diagnosis present

## 2013-11-09 DIAGNOSIS — Z7982 Long term (current) use of aspirin: Secondary | ICD-10-CM | POA: Insufficient documentation

## 2013-11-09 DIAGNOSIS — IMO0002 Reserved for concepts with insufficient information to code with codable children: Secondary | ICD-10-CM | POA: Insufficient documentation

## 2013-11-09 DIAGNOSIS — Z79899 Other long term (current) drug therapy: Secondary | ICD-10-CM | POA: Insufficient documentation

## 2013-11-09 DIAGNOSIS — E119 Type 2 diabetes mellitus without complications: Secondary | ICD-10-CM | POA: Insufficient documentation

## 2013-11-09 DIAGNOSIS — M5416 Radiculopathy, lumbar region: Secondary | ICD-10-CM

## 2013-11-09 LAB — CBC WITH DIFFERENTIAL/PLATELET
Basophils Absolute: 0 10*3/uL (ref 0.0–0.1)
Basophils Relative: 0 % (ref 0–1)
Eosinophils Absolute: 0.1 10*3/uL (ref 0.0–0.7)
Eosinophils Relative: 1 % (ref 0–5)
HCT: 46.9 % (ref 39.0–52.0)
Hemoglobin: 16.8 g/dL (ref 13.0–17.0)
LYMPHS ABS: 1.9 10*3/uL (ref 0.7–4.0)
LYMPHS PCT: 24 % (ref 12–46)
MCH: 31.9 pg (ref 26.0–34.0)
MCHC: 35.8 g/dL (ref 30.0–36.0)
MCV: 89 fL (ref 78.0–100.0)
Monocytes Absolute: 0.6 10*3/uL (ref 0.1–1.0)
Monocytes Relative: 8 % (ref 3–12)
Neutro Abs: 5.5 10*3/uL (ref 1.7–7.7)
Neutrophils Relative %: 67 % (ref 43–77)
Platelets: 171 10*3/uL (ref 150–400)
RBC: 5.27 MIL/uL (ref 4.22–5.81)
RDW: 12.6 % (ref 11.5–15.5)
WBC: 8.1 10*3/uL (ref 4.0–10.5)

## 2013-11-09 LAB — URINALYSIS, ROUTINE W REFLEX MICROSCOPIC
Bilirubin Urine: NEGATIVE
GLUCOSE, UA: NEGATIVE mg/dL
HGB URINE DIPSTICK: NEGATIVE
Ketones, ur: NEGATIVE mg/dL
Leukocytes, UA: NEGATIVE
Nitrite: NEGATIVE
PH: 5.5 (ref 5.0–8.0)
Protein, ur: NEGATIVE mg/dL
SPECIFIC GRAVITY, URINE: 1.015 (ref 1.005–1.030)
Urobilinogen, UA: 0.2 mg/dL (ref 0.0–1.0)

## 2013-11-09 LAB — BASIC METABOLIC PANEL
BUN: 16 mg/dL (ref 6–23)
CO2: 24 meq/L (ref 19–32)
Calcium: 9.4 mg/dL (ref 8.4–10.5)
Chloride: 103 mEq/L (ref 96–112)
Creatinine, Ser: 0.87 mg/dL (ref 0.50–1.35)
GFR calc Af Amer: >90 mL/min (ref >90)
GFR calc non Af Amer: >90 mL/min (ref >90)
GLUCOSE: 164 mg/dL — AB (ref 70–99)
POTASSIUM: 4.2 meq/L (ref 3.7–5.3)
SODIUM: 140 meq/L (ref 137–147)

## 2013-11-09 LAB — MAGNESIUM: MAGNESIUM: 1.9 mg/dL (ref 1.5–2.5)

## 2013-11-09 NOTE — ED Notes (Signed)
Dr. Malachi Pro in to see pt, no changes, NAD, calm, alert. Pt updated.

## 2013-11-09 NOTE — Discharge Instructions (Signed)
Back Pain, Adult  Back pain is very common. The pain often gets better over time. The cause of back pain is usually not dangerous. Most people can learn to manage their back pain on their own.   HOME CARE   · Stay active. Start with short walks on flat ground if you can. Try to walk farther each day.  · Do not sit, drive, or stand in one place for more than 30 minutes. Do not stay in bed.  · Do not avoid exercise or work. Activity can help your back heal faster.  · Be careful when you bend or lift an object. Bend at your knees, keep the object close to you, and do not twist.  · Sleep on a firm mattress. Lie on your side, and bend your knees. If you lie on your back, put a pillow under your knees.  · Only take medicines as told by your doctor.  · Put ice on the injured area.  · Put ice in a plastic bag.  · Place a towel between your skin and the bag.  · Leave the ice on for 15-20 minutes, 03-04 times a day for the first 2 to 3 days. After that, you can switch between ice and heat packs.  · Ask your doctor about back exercises or massage.  · Avoid feeling anxious or stressed. Find good ways to deal with stress, such as exercise.  GET HELP RIGHT AWAY IF:   · Your pain does not go away with rest or medicine.  · Your pain does not go away in 1 week.  · You have new problems.  · You do not feel well.  · The pain spreads into your legs.  · You cannot control when you poop (bowel movement) or pee (urinate).  · Your arms or legs feel weak or lose feeling (numbness).  · You feel sick to your stomach (nauseous) or throw up (vomit).  · You have belly (abdominal) pain.  · You feel like you may pass out (faint).  MAKE SURE YOU:   · Understand these instructions.  · Will watch your condition.  · Will get help right away if you are not doing well or get worse.  Document Released: 12/28/2007 Document Revised: 10/03/2011 Document Reviewed: 11/29/2010  [employer]® Patient Information ©2014 [employer].

## 2013-11-09 NOTE — ED Notes (Signed)
Pt reports numbness in bilateral feet starting yesterday morning radiating up both ankles. Pt also states feeling some numbness in lower back while having a BM yesterday. Pt's was seen by PCP and diagnosis with no acute findings. Pt is a type 2 diabetic but does not take any medication. Pt denies difficulty walking. Pt is A&Ox4, respirations equal and unlabored, skin warm and dry.

## 2013-11-09 NOTE — ED Provider Notes (Signed)
11:04 AM: MRI shows impingment at L4-L5. Pt continues to appear well. Does not want narcotic pain medicine.  I have discussed the diagnosis/risks/treatment options with the patient and believe the pt to be eligible for discharge home to follow-up with [facility] as outpt. We also discussed returning to the ED immediately if new or worsening sx occur. We discussed the sx which are most concerning (e.g., worsening pain, weakness/numbness, BP redflags discussed) that necessitate immediate return. Medications administered to the patient during their visit and any new prescriptions provided to the patient are listed below.  Medications given during this visit Medications - No data to display  Discharge Medication List as of 11/09/2013 11:05 AM     Clinical Impression 1. Numbness in feet   2. Lumbar nerve root impingement      Blanchard Kelch, MD 11/09/13 2134

## 2013-11-09 NOTE — ED Notes (Signed)
Patient discharged to home with family. NAD.  

## 2013-11-09 NOTE — ED Notes (Addendum)
C/o bilateral lower leg numbness (A&P) mid calf to toes equally. Mentions seeing PCP Dr. Elease Hashimoto today in office for the same. Mentions "circulation and pulses were good, did not think it was neuropathy". CMS  & ROM intact, strong and equal at this time. Strong DP & PT. Also reports intermittent numbness in anal/rectal area and penile area. Steady gait. (denies: unilateral sx,  tripping, falls, loss of control of bowel or bladder or incontinence; also denies: CP, sob, nvd, fever dizziness or other sx). Mentions has had some back pain and R hip pain and sees a chiropractor for the same.

## 2013-11-09 NOTE — ED Provider Notes (Signed)
CSN: [id number]     Arrival date & time 11/09/13  0143 History   First MD Initiated Contact with Patient 11/09/13 [phone]     Chief Complaint  Patient presents with  . Numbness     (Consider location/radiation/quality/duration/timing/severity/associated sxs/prior Treatment) HPI Comments: Pt comes in with cc of numbness, to bilateral leg, feet and around his anus. His sx started y'day. Seen by PCP and Chiropractor, and they couldn't figure out the etiology. There is no weakness. No new back pain. No associated urinary incontinence, urinary retention, bowel incontinence, saddle anesthesia.    The history is provided by the patient.    Past Medical History  Diagnosis Date  . History of chest pain     Hospitalized ER in [place] for CP and Palpitations with Neg Enzymes  . Gilbert syndrome   . Testosterone deficiency     Dr Hartley Barefoot  . Diabetes mellitus 6/12    A1c 7.1 %   . Hyperlipidemia    Past Surgical History  Procedure Laterality Date  . Knee arthroscopy      Left  . Shoulder arthroscopy    . Nose surgery      Dr  Truman Hayward  . Colonoscopy      Tics, Polyp 12/26/2003; polyps 2010, Dr Carlean Purl  . Cholecystectomy  2010   Family History  Problem Relation Age of Onset  . Depression Father   . Heart disease Father     MI in 1s  . Prostate cancer Father   . OCD Father   . Depression Mother   . Arthritis Mother   . Stroke Mother 35  . Parkinsonism Mother   . Diabetes Mother   . Depression Sister     OCD  . Prostate cancer Paternal Grandfather   . Diabetes Maternal Grandfather     MI in 23s   History  Substance Use Topics  . Smoking status: Never Smoker   . Smokeless tobacco: Never Used  . Alcohol Use: 0.0 oz/week     Comment: Wine occasionally- stopped drinking beer     Review of Systems  Constitutional: Negative for activity change and appetite change.  Respiratory: Negative for cough and shortness of breath.   Cardiovascular: Negative for chest pain.   Gastrointestinal: Negative for abdominal pain.  Genitourinary: Negative for dysuria.  Neurological: Positive for numbness.      Allergies  Codeine and Prozac  Home Medications   Prior to Admission medications   Medication Sig Start Date End Date Taking? Authorizing Provider  Ascorbic Acid (VITAMIN C) 250 MG tablet Take 250 mg by mouth daily.     Yes Historical Provider, MD  aspirin 81 MG tablet Take 81 mg by mouth daily.     Yes Historical Provider, MD  b complex vitamins capsule Take 1 capsule by mouth daily.     Yes Historical Provider, MD  CHOLECALCIFEROL PO Take 1 tablet by mouth daily.   Yes Historical Provider, MD  MAGNESIUM PO Take 1 tablet by mouth daily.   Yes Historical Provider, MD  Multiple Vitamin (MULTIVITAMIN) tablet Take 1 tablet by mouth daily.     Yes Historical Provider, MD   BP 166/85  Pulse 53  Temp(Src) 97.6 F (36.4 C) (Oral)  Resp 16  Ht 6\' 3"  (1.905 m)  Wt 229 lb (103.874 kg)  BMI 28.62 kg/m2  SpO2 96% Physical Exam  Nursing note and vitals reviewed. Constitutional: He is oriented to person, place, and time. He appears well-developed.  HENT:  Head: Normocephalic  and atraumatic.  Eyes: Conjunctivae and EOM are normal. Pupils are equal, round, and reactive to light.  Neck: Normal range of motion. Neck supple.  Cardiovascular: Normal rate and regular rhythm.   Pulmonary/Chest: Effort normal and breath sounds normal.  Abdominal: Soft. Bowel sounds are normal. He exhibits no distension. There is no tenderness. There is no rebound and no guarding.  Neurological: He is alert and oriented to person, place, and time.  Cerebellar exam is normal (finger to nose) Sensory exam normal for bilateral upper and lower extremities - and patient is able to discriminate between sharp and dull. PT HAS SUBJECTIVE NUMBNESS Motor exam is 4+/5 Intact rectal tone   Skin: Skin is warm.    ED Course  Procedures (including critical care time) Labs Review Labs Reviewed   BASIC METABOLIC PANEL - Abnormal; Notable for the following:    Glucose, Bld 164 (*)    All other components within normal limits  CBC WITH DIFFERENTIAL  URINALYSIS, ROUTINE W REFLEX MICROSCOPIC  MAGNESIUM    Imaging Review No results found.   EKG Interpretation None      MDM   Final diagnoses:  None    Pt with bilateral leg numbness, Also anal numn=bness. No other neuro complains, and no neuro deficits on exam. Spoke with Dr. Nicole Kindred, who recommends MRI L spine.  Dr. Aline Brochure to f/u on the results.   Varney Biles, MD 11/11/13 [phone]

## 2013-11-11 ENCOUNTER — Ambulatory Visit (INDEPENDENT_AMBULATORY_CARE_PROVIDER_SITE_OTHER): Payer: 59 | Admitting: Family Medicine

## 2013-11-11 ENCOUNTER — Encounter: Payer: Self-pay | Admitting: Family Medicine

## 2013-11-11 VITALS — BP 136/70 | HR 72 | Temp 98.1°F | Wt 227.0 lb

## 2013-11-11 DIAGNOSIS — R209 Unspecified disturbances of skin sensation: Secondary | ICD-10-CM

## 2013-11-11 DIAGNOSIS — R202 Paresthesia of skin: Secondary | ICD-10-CM

## 2013-11-11 LAB — HEMOGLOBIN A1C: HEMOGLOBIN A1C: 6.5 % (ref 4.6–6.5)

## 2013-11-11 LAB — TSH: TSH: 1.75 u[IU]/mL (ref 0.35–5.50)

## 2013-11-11 LAB — VITAMIN B12: VITAMIN B 12: 345 pg/mL (ref 211–911)

## 2013-11-11 NOTE — Progress Notes (Signed)
Subjective:    Patient ID: Aaron Bradshaw, male    DOB: 1951/07/17, 63 y.o.   MRN: [id number]  HPI Patient seen for followup regarding paresthesias involving his lower extremities. Onset last week he had bilateral involvement of the feet and lower legs which came on rather acutely. He did not have any associated weakness or any saddle anesthesia or any urine or stool incontinence. He was seen by chiropractor. Over the weekend his symptoms progressed in terms of proximal involvement all the way up to his anal area (but no loss of stool or urine continence). He went to emergency department and had MRI scan which showed only some non-specific degenerative changes in his lumbar region. He did not have any neurologic deficits except for subjective numbness. No motor weakness. Documentation of normal sphincter tone. MRI scan showed lumbar spondylosis and degenerative disc disease with mild right eccentric impingement L4-L5. No cord lesions. Lyme antibodies last summer normal.  Patient went back to his chiropractor this morning and had some type of cervical neck adjustment. He was told by chiropractor that the symptoms were likely coming from his neck though we explained we did not think this was likely. He has not had any history of B12 deficiency or any specific risk factors. Has type 2 diabetes which has been very well controlled. TSH last year was normal. He has had some general fatigue issues recently but no fevers or chills no appetite or weight changes. His last hemoglobin A1c was 6.9%. He had no upper extremity symptoms whatsoever. His symptoms do seem stable over the past couple of days.  No recent tick bites  Past Medical History  Diagnosis Date  . History of chest pain     Hospitalized ER in [place] for CP and Palpitations with Neg Enzymes  . Gilbert syndrome   . Testosterone deficiency     Dr Hartley Barefoot  . Diabetes mellitus 6/12    A1c 7.1 %   . Hyperlipidemia    Past Surgical History    Procedure Laterality Date  . Knee arthroscopy      Left  . Shoulder arthroscopy    . Nose surgery      Dr  Truman Hayward  . Colonoscopy      Tics, Polyp 12/26/2003; polyps 2010, Dr Carlean Purl  . Cholecystectomy  2010    reports that he has never smoked. He has never used smokeless tobacco. He reports that he drinks alcohol. He reports that he does not use illicit drugs. family history includes Arthritis in his mother; Depression in his father, mother, and sister; Diabetes in his maternal grandfather and mother; Heart disease in his father; OCD in his father; Parkinsonism in his mother; Prostate cancer in his father and paternal grandfather; Stroke (age of onset: 24) in his mother. Allergies  Allergen Reactions  . Codeine Nausea Only  . Prozac [Fluoxetine Hcl] Other (See Comments)    Increased anxiety      Review of Systems  Constitutional: Negative for fever, chills and unexpected weight change.  Respiratory: Negative for cough and shortness of breath.   Cardiovascular: Negative for chest pain, palpitations and leg swelling.  Gastrointestinal: Negative for abdominal pain.  Endocrine: Negative for polydipsia and polyuria.  Musculoskeletal: Negative for back pain, joint swelling and myalgias.  Skin: Negative for rash.  Neurological: Positive for numbness. Negative for dizziness, weakness and headaches.       Objective:   Physical Exam  Constitutional: He appears well-developed and well-nourished.  Cardiovascular: Normal rate and  regular rhythm.   Pulmonary/Chest: Effort normal and breath sounds normal. No respiratory distress. He has no wheezes. He has no rales.  Musculoskeletal: He exhibits no edema.  No muscle atrophy.  Neurological:  Patient has 1+ symmetric reflexes knee and ankle bilaterally. No strength deficits. Subjective sensory deficits to touch throughout lower extremities          Assessment & Plan:  Patient presents with somewhat progressive bilateral sensory neuropathy  involving lower extremities and sparing of upper extremities. Recent MRI scan revealed only nonspecific degenerative changes but nothing to explain his current symptoms. He has type 2 diabetes that has been well controlled. He does not appear to have any motor deficits. He does not have any focal motor weakness or any loss of reflexes to suggest Guillain Barre'. Check hemoglobin A1c, TSH, B12. Set up neurology referral. May need further assessment including things like serum protein electrophoresis, Lyme antibodies, etc. He has not any recent tick bites or unusual skin rash.  No regular meds.

## 2013-11-11 NOTE — Patient Instructions (Addendum)
Follow up promptly for any weakness or progressive numbness.

## 2013-11-11 NOTE — Progress Notes (Signed)
Pre visit review using our clinic review tool, if applicable. No additional management support is needed unless otherwise documented below in the visit note. 

## 2013-11-12 ENCOUNTER — Telehealth: Payer: Self-pay | Admitting: Family Medicine

## 2013-11-12 NOTE — Telephone Encounter (Signed)
Pt was seen on yesterday, and wanted dr. Elease Hashimoto to know that he still has not yet had a bowel movement in 5 days. Pt wants to know what to do, states his lower body is still numb and he is not able to work any of his muscle below to make a bowel movement.

## 2013-11-12 NOTE — Telephone Encounter (Signed)
Patient walked into the office a pm complaining of lower extremity numbness and no BM for 5 days.  Advised patient to go to the ER or Urgent Care.  Patient refused.

## 2013-11-12 NOTE — Telephone Encounter (Signed)
Pt is calling requesting blood work result and checking on a referral. Pt was seen yesterday.

## 2013-11-13 NOTE — Telephone Encounter (Signed)
Dr. Elease Hashimoto they only have 2 doctors in this week. The Scheduler stated that you would have to call the office and speak to a doctor for the patient to be worked in sooner and they did not have any cancellations today. [phone]

## 2013-11-13 NOTE — Telephone Encounter (Signed)
Patient informed. 

## 2013-11-13 NOTE — Telephone Encounter (Signed)
Called and left message to call back.  Let's see if neurology appt can be moved up  Is there any way they could see today?

## 2013-11-13 NOTE — Telephone Encounter (Signed)
Let him know- I spoke with Dr Delice Lesch.Marland Kitchen He has appt with Dr Tomi Likens next week on the 29th.  Main thing is to make sure he has no shortness of breath, weakness, or urine or stool incontinence.  Dr Delice Lesch said she could work him in today if any progressive symptoms and especially symptoms above.

## 2013-11-14 DIAGNOSIS — R202 Paresthesia of skin: Secondary | ICD-10-CM | POA: Insufficient documentation

## 2013-11-19 ENCOUNTER — Encounter: Payer: Self-pay | Admitting: Family Medicine

## 2013-11-19 ENCOUNTER — Ambulatory Visit (INDEPENDENT_AMBULATORY_CARE_PROVIDER_SITE_OTHER): Payer: 59 | Admitting: Family Medicine

## 2013-11-19 VITALS — BP 130/74 | HR 67 | Wt 224.0 lb

## 2013-11-19 DIAGNOSIS — G0489 Other myelitis: Secondary | ICD-10-CM

## 2013-11-19 DIAGNOSIS — G373 Acute transverse myelitis in demyelinating disease of central nervous system: Secondary | ICD-10-CM | POA: Insufficient documentation

## 2013-11-19 NOTE — Patient Instructions (Addendum)
Follow up immediately for any shortness of breath or weakness Continue with laxatives as needed for any constipation issues.  Continue to monitor blood sugars and remain on blood sugars until fasting sugars have normalized.

## 2013-11-19 NOTE — Progress Notes (Signed)
   Subjective:    Patient ID: Aaron Bradshaw, male    DOB: 11/09/1950, 63 y.o.   MRN: [id number]  HPI Patient seen for followup regarding lower extremity paresthesias. He developed early last week initially just some involvement of the feet and ankles without any associated areflexia or weakness. His symptoms progressed through the week and he was admitted to [facility] last Friday for further evaluation. Previous MRI scan without contrast at [facility] hospital revealed only mild degenerative changes. He was admitted with suspected transverse myelitis.  Patient had extensive workup. MRI of the brain and MRI sacrum and cervical spine no acute abnormality. MRI thoracic spine revealed hyperintense lesion within the central cord at T7-T8 level. Patient had lumbar puncture and CSF fluid unremarkable. He had extensive lab testing including Lyme antibodies, HIV, screens for inflammatory arthritis, RPR which were all unremarkable. He's never had any fever. He has never had any weakness or areflexia. He was given IV Solu-Medrol and has not seen any symptomatic improvement.  His other issues been difficulty evacuating stools/constipation. He had not had a bowel movement in about 12 days and was eventually given enema in the hospital with good success. He tried MiraLax, Senokot, and other basic measures for constipation without improvement. He is not having any abdominal pain or distention at this time. Last bowel movement 2 days ago. He has outpatient followup with neurology being arranged.   Review of Systems  Constitutional: Negative for fever, chills, appetite change and unexpected weight change.  HENT: Negative for trouble swallowing.   Eyes: Negative for visual disturbance.  Respiratory: Negative for cough and shortness of breath.   Cardiovascular: Negative for chest pain and leg swelling.  Gastrointestinal: Positive for constipation. Negative for nausea, vomiting and abdominal pain.    Genitourinary: Negative for decreased urine volume.  Neurological: Positive for numbness. Negative for weakness.  Psychiatric/Behavioral: Negative for confusion.       Objective:   Physical Exam  Constitutional: He appears well-developed and well-nourished.  Cardiovascular: Normal rate and regular rhythm.  Exam reveals no gallop.   Pulmonary/Chest: Effort normal and breath sounds normal. No respiratory distress. He has no wheezes. He has no rales.  Musculoskeletal: He exhibits no edema.  Neurological:  Full strength lower extremities.  DTRs are 2 plus knee and ankle bilateral..  Mildly impaired sensation in feet and legs to sharp, dull.  Can sense monofilament in most portions of feet and legs.          Assessment & Plan:  Transverse myelitis of uncertain etiology. He has some numbness which has progressed up to around the umbilicus region which is near her the level involvement on MRI scan. He has outpatient followup with neurology. Extensive workup as above. He has preserved strength and reflexes today. No respiratory difficulties. He is encouraged to followup immediately for any weakness or respiratory issues. We discussed measures to reduce his constipation. It no success with MiraLax we'll try magnesium citrate.

## 2013-11-19 NOTE — Progress Notes (Signed)
Pre visit review using our clinic review tool, if applicable. No additional management support is needed unless otherwise documented below in the visit note. 

## 2013-11-20 ENCOUNTER — Ambulatory Visit: Payer: 59 | Admitting: Neurology

## 2013-11-26 ENCOUNTER — Telehealth: Payer: Self-pay | Admitting: Family Medicine

## 2013-11-26 ENCOUNTER — Other Ambulatory Visit: Payer: Self-pay

## 2013-11-26 NOTE — Telephone Encounter (Signed)
Patient is requesting a prescription for Metformin. He also saw a Neurologist Dr. William Hamburger in [place] for a spinal infection and said that his symptoms are improving.

## 2013-11-26 NOTE — Telephone Encounter (Signed)
Is it okay to add this medication to his list?

## 2013-11-26 NOTE — Telephone Encounter (Signed)
We can add back, though his last A1c was good.

## 2013-11-27 MED ORDER — METFORMIN HCL ER 500 MG PO TB24: 500.0000 mg | ORAL_TABLET | Freq: Every day | ORAL | Status: DC

## 2013-11-27 NOTE — Telephone Encounter (Signed)
Rx sent to pharmacy   

## 2014-01-07 ENCOUNTER — Ambulatory Visit (INDEPENDENT_AMBULATORY_CARE_PROVIDER_SITE_OTHER): Payer: 59 | Admitting: Family Medicine

## 2014-01-07 ENCOUNTER — Encounter: Payer: Self-pay | Admitting: Family Medicine

## 2014-01-07 VITALS — BP 130/78 | HR 68 | Temp 98.4°F | Wt 230.0 lb

## 2014-01-07 DIAGNOSIS — E119 Type 2 diabetes mellitus without complications: Secondary | ICD-10-CM

## 2014-01-07 NOTE — Patient Instructions (Signed)
Consider increasing metformin to 500 mg twice daily if fasting sugars remain consistently > 130.

## 2014-01-07 NOTE — Progress Notes (Signed)
Pre visit review using our clinic review tool, if applicable. No additional management support is needed unless otherwise documented below in the visit note. 

## 2014-01-07 NOTE — Progress Notes (Signed)
   Subjective:    Patient ID: Aaron Bradshaw, male    DOB: 1951-07-15, 63 y.o.   MRN: [id number]  Diabetes Pertinent negatives for hypoglycemia include no dizziness or headaches. Pertinent negatives for diabetes include no chest pain, no fatigue and no weakness.   Recent transverse myelitis. He is followed by neurologist. His numbness and weakness are slowly improving. He received prednisone only through IV with hospitalized back in late April. No steroids since then. Last A1c 6.5% April 20. He remains on metformin 500 mg once daily which she started back on one month ago. Fasting blood sugars are still elevated off and on but vary considerably. Most of his fasting blood sugars have been around 120-160 range. No symptoms of hyperglycemia. No history of neuropathy related to diabetes.  Past Medical History  Diagnosis Date  . History of chest pain     Hospitalized ER in [place] for CP and Palpitations with Neg Enzymes  . Gilbert syndrome   . Testosterone deficiency     Dr Hartley Barefoot  . Diabetes mellitus 6/12    A1c 7.1 %   . Hyperlipidemia    Past Surgical History  Procedure Laterality Date  . Knee arthroscopy      Left  . Shoulder arthroscopy    . Nose surgery      Dr  Truman Hayward  . Colonoscopy      Tics, Polyp 12/26/2003; polyps 2010, Dr Carlean Purl  . Cholecystectomy  2010    reports that he has never smoked. He has never used smokeless tobacco. He reports that he drinks alcohol. He reports that he does not use illicit drugs. family history includes Arthritis in his mother; Depression in his father, mother, and sister; Diabetes in his maternal grandfather and mother; Heart disease in his father; OCD in his father; Parkinsonism in his mother; Prostate cancer in his father and paternal grandfather; Stroke (age of onset: 45) in his mother. Allergies  Allergen Reactions  . Codeine Nausea Only  . Fluoxetine Anxiety  . Prozac [Fluoxetine Hcl] Other (See Comments)    Increased anxiety       Review of Systems  Constitutional: Negative for fatigue.  Eyes: Negative for visual disturbance.  Respiratory: Negative for cough, chest tightness and shortness of breath.   Cardiovascular: Negative for chest pain, palpitations and leg swelling.  Neurological: Negative for dizziness, syncope, weakness, light-headedness and headaches.       Objective:   Physical Exam  Constitutional: He appears well-developed and well-nourished.  Cardiovascular: Normal rate and regular rhythm.   Pulmonary/Chest: Effort normal and breath sounds normal. No respiratory distress. He has no wheezes. He has no rales.  Musculoskeletal: He exhibits no edema.          Assessment & Plan:  Type 2 diabetes. Recent exacerbation probably related to prednisone. He recently started back metformin. He is stepping up exercise. After the next one to 2 weeks if fasting blood sugars not consistently less than 130 he will titrate metformin 500 mg to twice daily. Schedule followup in August to reassess

## 2014-01-09 ENCOUNTER — Encounter: Payer: Self-pay | Admitting: Family Medicine

## 2014-01-14 LAB — HM DIABETES EYE EXAM

## 2014-01-28 ENCOUNTER — Ambulatory Visit: Payer: 59 | Admitting: Family Medicine

## 2014-02-12 ENCOUNTER — Other Ambulatory Visit: Payer: Self-pay | Admitting: Family Medicine

## 2014-02-12 DIAGNOSIS — E119 Type 2 diabetes mellitus without complications: Secondary | ICD-10-CM

## 2014-02-26 ENCOUNTER — Encounter: Payer: Self-pay | Admitting: Family Medicine

## 2014-02-28 ENCOUNTER — Ambulatory Visit: Payer: 59 | Admitting: Family Medicine

## 2014-03-07 ENCOUNTER — Ambulatory Visit (INDEPENDENT_AMBULATORY_CARE_PROVIDER_SITE_OTHER): Payer: 59 | Admitting: Family Medicine

## 2014-03-07 ENCOUNTER — Encounter: Payer: Self-pay | Admitting: Family Medicine

## 2014-03-07 VITALS — BP 130/80 | HR 67 | Wt 232.0 lb

## 2014-03-07 DIAGNOSIS — G373 Acute transverse myelitis in demyelinating disease of central nervous system: Secondary | ICD-10-CM

## 2014-03-07 DIAGNOSIS — G0489 Other myelitis: Secondary | ICD-10-CM

## 2014-03-07 DIAGNOSIS — F411 Generalized anxiety disorder: Secondary | ICD-10-CM

## 2014-03-07 DIAGNOSIS — E119 Type 2 diabetes mellitus without complications: Secondary | ICD-10-CM

## 2014-03-07 NOTE — Progress Notes (Signed)
Pre visit review using our clinic review tool, if applicable. No additional management support is needed unless otherwise documented below in the visit note. 

## 2014-03-07 NOTE — Progress Notes (Signed)
   Subjective:    Patient ID: Aaron Bradshaw, male    DOB: [birth year], 63 y.o.   MRN: [id number]  HPI Medical followup  Type 2 diabetes. History of good control. On metformin. Exercises every day. Fasting blood sugars usually run 130. No symptoms of hyperglycemia.  Recovering from transverse myelitis. Symptoms have almost resolved. Followed by neurology.  Patient has long history of anxiety. Possible panic attacks. Frequently wakes up at night with palpitations. He previously took Prozac but he felt this made him even more nervous. He like to consider other options. Symptoms vary considerably in frequency. No exacerbating factors.  Denies depression symptoms.  Past Medical History  Diagnosis Date  . History of chest pain     Hospitalized ER in [place] for CP and Palpitations with Neg Enzymes  . Gilbert syndrome   . Testosterone deficiency     Dr Hartley Barefoot  . Diabetes mellitus 6/12    A1c 7.1 %   . Hyperlipidemia    Past Surgical History  Procedure Laterality Date  . Knee arthroscopy      Left  . Shoulder arthroscopy    . Nose surgery      Dr  Truman Hayward  . Colonoscopy      Tics, Polyp 12/26/2003; polyps 2010, Dr Carlean Purl  . Cholecystectomy  2010    reports that he has never smoked. He has never used smokeless tobacco. He reports that he drinks alcohol. He reports that he does not use illicit drugs. family history includes Arthritis in his mother; Depression in his father, mother, and sister; Diabetes in his maternal grandfather and mother; Heart disease in his father; OCD in his father; Parkinsonism in his mother; Prostate cancer in his father and paternal grandfather; Stroke (age of onset: 66) in his mother. Allergies  Allergen Reactions  . Codeine Nausea Only  . Fluoxetine Anxiety  . Prozac [Fluoxetine Hcl] Other (See Comments)    Increased anxiety      Review of Systems  Constitutional: Negative for fatigue.  Eyes: Negative for visual disturbance.  Respiratory: Negative  for cough, chest tightness and shortness of breath.   Cardiovascular: Negative for chest pain, palpitations and leg swelling.  Endocrine: Negative for polydipsia and polyuria.  Neurological: Negative for dizziness, syncope, weakness, light-headedness and headaches.  Psychiatric/Behavioral: Positive for sleep disturbance. The patient is nervous/anxious.        Objective:   Physical Exam  Constitutional: He appears well-developed and well-nourished.  Cardiovascular: Normal rate and regular rhythm.   Pulmonary/Chest: Effort normal and breath sounds normal. No respiratory distress. He has no wheezes. He has no rales.  Musculoskeletal: He exhibits no edema.  Psychiatric: He has a normal mood and affect. His behavior is normal.          Assessment & Plan:  #1 type 2 diabetes. History of good control. Order future labs with A1c. Repeat lipid panel. Goal LDL less than 100 Check urine microalbumin screen #2 chronic anxiety. Question panic attack.  Consider SSRI such as Zoloft.  Pt wishes to wait until return from vacation.

## 2014-03-10 ENCOUNTER — Other Ambulatory Visit (INDEPENDENT_AMBULATORY_CARE_PROVIDER_SITE_OTHER): Payer: 59

## 2014-03-10 DIAGNOSIS — E119 Type 2 diabetes mellitus without complications: Secondary | ICD-10-CM

## 2014-03-10 LAB — MICROALBUMIN / CREATININE URINE RATIO
Creatinine,U: 88.4 mg/dL
Microalb Creat Ratio: 0.6 mg/g (ref 0.0–30.0)
Microalb, Ur: 0.5 mg/dL (ref 0.0–1.9)

## 2014-03-10 LAB — HEMOGLOBIN A1C: HEMOGLOBIN A1C: 7.1 % — AB (ref 4.6–6.5)

## 2014-03-10 LAB — LIPID PANEL
Cholesterol: 185 mg/dL (ref 0–200)
HDL: 32.6 mg/dL — ABNORMAL LOW (ref >39.00)
LDL Cholesterol: 122 mg/dL — ABNORMAL HIGH (ref 0–99)
NONHDL: 152.4
Total CHOL/HDL Ratio: 6
Triglycerides: 150 mg/dL — ABNORMAL HIGH (ref 0.0–149.0)
VLDL: 30 mg/dL (ref 0.0–40.0)

## 2014-03-10 LAB — HEPATIC FUNCTION PANEL
ALBUMIN: 3.9 g/dL (ref 3.5–5.2)
ALT: 22 U/L (ref 0–53)
AST: 23 U/L (ref 0–37)
Alkaline Phosphatase: 58 U/L (ref 39–117)
BILIRUBIN TOTAL: 1.3 mg/dL — AB (ref 0.2–1.2)
Bilirubin, Direct: 0.1 mg/dL (ref 0.0–0.3)
Total Protein: 6.8 g/dL (ref 6.0–8.3)

## 2014-04-02 ENCOUNTER — Ambulatory Visit (INDEPENDENT_AMBULATORY_CARE_PROVIDER_SITE_OTHER): Payer: 59 | Admitting: Family Medicine

## 2014-04-02 VITALS — BP 132/80 | HR 66 | Temp 97.8°F | Wt 228.0 lb

## 2014-04-02 DIAGNOSIS — E119 Type 2 diabetes mellitus without complications: Secondary | ICD-10-CM

## 2014-04-02 DIAGNOSIS — F411 Generalized anxiety disorder: Secondary | ICD-10-CM | POA: Insufficient documentation

## 2014-04-02 DIAGNOSIS — E785 Hyperlipidemia, unspecified: Secondary | ICD-10-CM

## 2014-04-02 MED ORDER — SERTRALINE HCL 50 MG PO TABS: 50.0000 mg | ORAL_TABLET | Freq: Every day | ORAL | Status: DC

## 2014-04-02 NOTE — Patient Instructions (Addendum)
Check on coverage for Shingles Vaccine.  Fat and Cholesterol Control Diet Fat and cholesterol levels in your blood and organs are influenced by your diet. High levels of fat and cholesterol may lead to diseases of the heart, small and large blood vessels, gallbladder, liver, and pancreas. CONTROLLING FAT AND CHOLESTEROL WITH DIET Although exercise and lifestyle factors are important, your diet is key. That is because certain foods are known to raise cholesterol and others to lower it. The goal is to balance foods for their effect on cholesterol and more importantly, to replace saturated and trans fat with other types of fat, such as monounsaturated fat, polyunsaturated fat, and omega-3 fatty acids. On average, a person should consume no more than 15 to 17 g of saturated fat daily. Saturated and trans fats are considered "bad" fats, and they will raise LDL cholesterol. Saturated fats are primarily found in animal products such as meats, butter, and cream. However, that does not mean you need to give up all your favorite foods. Today, there are good tasting, low-fat, low-cholesterol substitutes for most of the things you like to eat. Choose low-fat or nonfat alternatives. Choose round or loin cuts of red meat. These types of cuts are lowest in fat and cholesterol. Chicken (without the skin), fish, veal, and ground [place] breast are great choices. Eliminate fatty meats, such as hot dogs and salami. Even shellfish have little or no saturated fat. Have a 3 oz (85 g) portion when you eat lean meat, poultry, or fish. Trans fats are also called "partially hydrogenated oils." They are oils that have been scientifically manipulated so that they are solid at room temperature resulting in a longer shelf life and improved taste and texture of foods in which they are added. Trans fats are found in stick margarine, some tub margarines, cookies, crackers, and baked goods.  When baking and cooking, oils are a great  substitute for butter. The monounsaturated oils are especially beneficial since it is believed they lower LDL and raise HDL. The oils you should avoid entirely are saturated tropical oils, such as coconut and palm.  Remember to eat a lot from food groups that are naturally free of saturated and trans fat, including fish, fruit, vegetables, beans, grains (barley, rice, couscous, bulgur wheat), and pasta (without cream sauces).  IDENTIFYING FOODS THAT LOWER FAT AND CHOLESTEROL  Soluble fiber may lower your cholesterol. This type of fiber is found in fruits such as apples, vegetables such as broccoli, potatoes, and carrots, legumes such as beans, peas, and lentils, and grains such as barley. Foods fortified with plant sterols (phytosterol) may also lower cholesterol. You should eat at least 2 g per day of these foods for a cholesterol lowering effect.  Read package labels to identify low-saturated fats, trans fat free, and low-fat foods at the supermarket. Select cheeses that have only 2 to 3 g saturated fat per ounce. Use a heart-healthy tub margarine that is free of trans fats or partially hydrogenated oil. When buying baked goods (cookies, crackers), avoid partially hydrogenated oils. Breads and muffins should be made from whole grains (whole-wheat or whole oat flour, instead of "flour" or "enriched flour"). Buy non-creamy canned soups with reduced salt and no added fats.  FOOD PREPARATION TECHNIQUES  Never deep-fry. If you must fry, either stir-fry, which uses very little fat, or use non-stick cooking sprays. When possible, broil, bake, or roast meats, and steam vegetables. Instead of putting butter or margarine on vegetables, use lemon and herbs, applesauce, and cinnamon (  for squash and sweet potatoes). Use nonfat yogurt, salsa, and low-fat dressings for salads.  LOW-SATURATED FAT / LOW-FAT FOOD SUBSTITUTES Meats / Saturated Fat (g)  Avoid: Steak, marbled (3 oz/85 g) / 11 g  Choose: Steak, lean (3  oz/85 g) / 4 g  Avoid: Hamburger (3 oz/85 g) / 7 g  Choose: Hamburger, lean (3 oz/85 g) / 5 g  Avoid: Ham (3 oz/85 g) / 6 g  Choose: Ham, lean cut (3 oz/85 g) / 2.4 g  Avoid: Chicken, with skin, dark meat (3 oz/85 g) / 4 g  Choose: Chicken, skin removed, dark meat (3 oz/85 g) / 2 g  Avoid: Chicken, with skin, light meat (3 oz/85 g) / 2.5 g  Choose: Chicken, skin removed, light meat (3 oz/85 g) / 1 g Dairy / Saturated Fat (g)  Avoid: Whole milk (1 cup) / 5 g  Choose: Low-fat milk, 2% (1 cup) / 3 g  Choose: Low-fat milk, 1% (1 cup) / 1.5 g  Choose: Skim milk (1 cup) / 0.3 g  Avoid: Hard cheese (1 oz/28 g) / 6 g  Choose: Skim milk cheese (1 oz/28 g) / 2 to 3 g  Avoid: Cottage cheese, 4% fat (1 cup) / 6.5 g  Choose: Low-fat cottage cheese, 1% fat (1 cup) / 1.5 g  Avoid: Ice cream (1 cup) / 9 g  Choose: Sherbet (1 cup) / 2.5 g  Choose: Nonfat frozen yogurt (1 cup) / 0.3 g  Choose: Frozen fruit bar / trace  Avoid: Whipped cream (1 tbs) / 3.5 g  Choose: Nondairy whipped topping (1 tbs) / 1 g Condiments / Saturated Fat (g)  Avoid: Mayonnaise (1 tbs) / 2 g  Choose: Low-fat mayonnaise (1 tbs) / 1 g  Avoid: Butter (1 tbs) / 7 g  Choose: Extra light margarine (1 tbs) / 1 g  Avoid: Coconut oil (1 tbs) / 11.8 g  Choose: Olive oil (1 tbs) / 1.8 g  Choose: Corn oil (1 tbs) / 1.7 g  Choose: Safflower oil (1 tbs) / 1.2 g  Choose: Sunflower oil (1 tbs) / 1.4 g  Choose: Soybean oil (1 tbs) / 2.4 g  Choose: Canola oil (1 tbs) / 1 g Document Released: 07/11/2005 Document Revised: 11/05/2012 Document Reviewed: 10/09/2013 [employer] Patient Information 2015 [place], [place]. This information is not intended to replace advice given to you by your health care provider. Make sure you discuss any questions you have with your health care provider.

## 2014-04-02 NOTE — Progress Notes (Signed)
Pre visit review using our clinic review tool, if applicable. No additional management support is needed unless otherwise documented below in the visit note. 

## 2014-04-02 NOTE — Progress Notes (Signed)
Subjective:    Patient ID: Aaron Bradshaw, male    DOB: 1950/09/08, 63 y.o.   MRN: [id number]  HPI  Followup for several items  Type 2 diabetes. Most recent A1c 7.1%. During the spring, he was on prednisone for several weeks for transverse myelitis. Has been off that now for a few months. Exercises diligently. Takes metformin extended-release 500 milligrams once daily  Patient has mild hyperlipidemia. LDL recently 122. He has declined statin use. He prefers to try to lower saturated fats and recheck cholesterol in several months.  History of some chronic anxiety. Possible panic attacks. Symptoms are worse at night. Frequently has associated palpitations and anxiousness. He has discrete episodes that last several minutes. Previously took Prozac but felt it made him more anxious. He would like to consider trial of sertraline. Denies any depression symptoms.  No history of shingles vaccine. He does get flu vaccine elsewhere  Past Medical History  Diagnosis Date  . History of chest pain     Hospitalized ER in [place] for CP and Palpitations with Neg Enzymes  . Gilbert syndrome   . Testosterone deficiency     Dr Hartley Barefoot  . Diabetes mellitus 6/12    A1c 7.1 %   . Hyperlipidemia    Past Surgical History  Procedure Laterality Date  . Knee arthroscopy      Left  . Shoulder arthroscopy    . Nose surgery      Dr  Truman Hayward  . Colonoscopy      Tics, Polyp 12/26/2003; polyps 2010, Dr Carlean Purl  . Cholecystectomy  2010    reports that he has never smoked. He has never used smokeless tobacco. He reports that he drinks alcohol. He reports that he does not use illicit drugs. family history includes Arthritis in his mother; Depression in his father, mother, and sister; Diabetes in his maternal grandfather and mother; Heart disease in his father; OCD in his father; Parkinsonism in his mother; Prostate cancer in his father and paternal grandfather; Stroke (age of onset: 50) in his mother. Allergies    Allergen Reactions  . Codeine Nausea Only  . Fluoxetine Anxiety  . Prozac [Fluoxetine Hcl] Other (See Comments)    Increased anxiety      Review of Systems  Constitutional: Negative for fatigue.  Eyes: Negative for visual disturbance.  Respiratory: Negative for cough, chest tightness and shortness of breath.   Cardiovascular: Negative for chest pain, palpitations and leg swelling.  Endocrine: Negative for polydipsia and polyuria.  Neurological: Negative for dizziness, syncope, weakness, light-headedness and headaches.       Objective:   Physical Exam  Constitutional: He is oriented to person, place, and time. He appears well-developed and well-nourished.  HENT:  Right Ear: External ear normal.  Left Ear: External ear normal.  Mouth/Throat: Oropharynx is clear and moist.  Eyes: Pupils are equal, round, and reactive to light.  Neck: Neck supple. No thyromegaly present.  Cardiovascular: Normal rate and regular rhythm.   Pulmonary/Chest: Effort normal and breath sounds normal. No respiratory distress. He has no wheezes. He has no rales.  Musculoskeletal: He exhibits no edema.  Neurological: He is alert and oriented to person, place, and time.          Assessment & Plan:  #1 type 2 diabetes. History of fair control. Continue weight control efforts and recheck A1c in followup in 4 months. We discussed possibility of increasing his metformin to 1000 and he wishes to try lifestyle modification first #2 hyperlipidemia.  Discussed statin issues. He prefers reducing saturated fats and recheck lipids in 4 months #3 anxiety. Possible panic attacks. Sertraline 50 mg each bedtime. Touch base 3-4 weeks for feedback  #4 health maintenance.  He will check on insurance coverage for shingles vaccine. He plans to get flu vaccine elsewhere in one month

## 2014-04-23 ENCOUNTER — Encounter: Payer: Self-pay | Admitting: Family Medicine

## 2014-05-14 ENCOUNTER — Ambulatory Visit (INDEPENDENT_AMBULATORY_CARE_PROVIDER_SITE_OTHER): Payer: 59 | Admitting: Family Medicine

## 2014-05-14 DIAGNOSIS — Z23 Encounter for immunization: Secondary | ICD-10-CM

## 2014-06-02 ENCOUNTER — Other Ambulatory Visit: Payer: Self-pay | Admitting: Family Medicine

## 2014-06-12 ENCOUNTER — Other Ambulatory Visit: Payer: Self-pay | Admitting: Family Medicine

## 2014-07-07 ENCOUNTER — Ambulatory Visit: Payer: 59 | Admitting: Family Medicine

## 2014-07-08 ENCOUNTER — Other Ambulatory Visit (INDEPENDENT_AMBULATORY_CARE_PROVIDER_SITE_OTHER): Payer: 59

## 2014-07-08 DIAGNOSIS — E119 Type 2 diabetes mellitus without complications: Secondary | ICD-10-CM

## 2014-07-08 LAB — LIPID PANEL
Cholesterol: 170 mg/dL (ref 0–200)
HDL: 30.2 mg/dL — AB (ref >39.00)
LDL Cholesterol: 115 mg/dL — ABNORMAL HIGH (ref 0–99)
NONHDL: 139.8
Total CHOL/HDL Ratio: 6
Triglycerides: 125 mg/dL (ref 0.0–149.0)
VLDL: 25 mg/dL (ref 0.0–40.0)

## 2014-07-08 LAB — HEMOGLOBIN A1C: Hgb A1c MFr Bld: 7 % — ABNORMAL HIGH (ref 4.6–6.5)

## 2014-07-08 NOTE — Addendum Note (Signed)
Addended by: Joyce Gross R on: 07/08/2014 10:00 AM   Modules accepted: Orders

## 2014-07-15 ENCOUNTER — Ambulatory Visit (INDEPENDENT_AMBULATORY_CARE_PROVIDER_SITE_OTHER): Payer: 59 | Admitting: Family Medicine

## 2014-07-15 ENCOUNTER — Encounter: Payer: Self-pay | Admitting: Family Medicine

## 2014-07-15 VITALS — BP 126/80 | HR 55 | Temp 97.3°F | Wt 227.0 lb

## 2014-07-15 DIAGNOSIS — E119 Type 2 diabetes mellitus without complications: Secondary | ICD-10-CM

## 2014-07-15 NOTE — Progress Notes (Signed)
   Subjective:    Patient ID: Aaron Bradshaw, male    DOB: [birth year], 63 y.o.   MRN: [id number]  HPI  follow-up regarding recent lab work. He has type 2 diabetes recent A1c 7.0%. Remains on metformin. LDL cholesterol slightly over 100. He has been reluctant to take statins in the past. Exercises regularly. No chest pains. Fasting blood sugar stable. No symptoms of polyuria or polydipsia. Weight stable. He had transverse myelitis last year has had basically full recovery  Past Medical History  Diagnosis Date  . History of chest pain     Hospitalized ER in [place] for CP and Palpitations with Neg Enzymes  . Gilbert syndrome   . Testosterone deficiency     Dr Hartley Barefoot  . Diabetes mellitus 6/12    A1c 7.1 %   . Hyperlipidemia    Past Surgical History  Procedure Laterality Date  . Knee arthroscopy      Left  . Shoulder arthroscopy    . Nose surgery      Dr  Truman Hayward  . Colonoscopy      Tics, Polyp 12/26/2003; polyps 2010, Dr Carlean Purl  . Cholecystectomy  2010    reports that he has never smoked. He has never used smokeless tobacco. He reports that he drinks alcohol. He reports that he does not use illicit drugs. family history includes Arthritis in his mother; Depression in his father, mother, and sister; Diabetes in his maternal grandfather and mother; Heart disease in his father; OCD in his father; Parkinsonism in his mother; Prostate cancer in his father and paternal grandfather; Stroke (age of onset: 64) in his mother. Allergies  Allergen Reactions  . Codeine Nausea Only  . Fluoxetine Anxiety  . Prozac [Fluoxetine Hcl] Other (See Comments)    Increased anxiety      Review of Systems  Constitutional: Negative for fatigue.  Eyes: Negative for visual disturbance.  Respiratory: Negative for cough, chest tightness and shortness of breath.   Cardiovascular: Negative for chest pain, palpitations and leg swelling.  Endocrine: Negative for polydipsia and polyuria.  Neurological:  Negative for dizziness, syncope, weakness, light-headedness and headaches.       Objective:   Physical Exam  Constitutional: He is oriented to person, place, and time. He appears well-developed and well-nourished.  HENT:  Right Ear: External ear normal.  Left Ear: External ear normal.  Mouth/Throat: Oropharynx is clear and moist.  Eyes: Pupils are equal, round, and reactive to light.  Neck: Neck supple. No thyromegaly present.  Cardiovascular: Normal rate and regular rhythm.   Pulmonary/Chest: Effort normal and breath sounds normal. No respiratory distress. He has no wheezes. He has no rales.  Musculoskeletal: He exhibits no edema.  Neurological: He is alert and oriented to person, place, and time.  Skin:  Feet reveal no skin lesions. Good distal foot pulses. Good capillary refill. No calluses. Normal sensation with monofilament testing           Assessment & Plan:   #1 type 2 diabetes. Good control. Continue healthy lifestyle habits. Continue metformin. LDL cholesterol slightly elevated. We discussed statins and he declines. Routine follow-up 4 months and repeat A1c been

## 2014-07-15 NOTE — Progress Notes (Signed)
Pre visit review using our clinic review tool, if applicable. No additional management support is needed unless otherwise documented below in the visit note. 

## 2014-07-20 ENCOUNTER — Other Ambulatory Visit: Payer: Self-pay | Admitting: Family Medicine

## 2014-07-23 ENCOUNTER — Encounter: Payer: Self-pay | Admitting: Internal Medicine

## 2014-09-22 ENCOUNTER — Other Ambulatory Visit: Payer: Self-pay | Admitting: Dermatology

## 2014-10-07 ENCOUNTER — Other Ambulatory Visit: Payer: 59

## 2014-11-05 ENCOUNTER — Other Ambulatory Visit: Payer: Self-pay | Admitting: Family Medicine

## 2014-11-05 ENCOUNTER — Encounter: Payer: Self-pay | Admitting: Internal Medicine

## 2014-11-07 ENCOUNTER — Other Ambulatory Visit: Payer: Self-pay

## 2014-11-10 ENCOUNTER — Other Ambulatory Visit (INDEPENDENT_AMBULATORY_CARE_PROVIDER_SITE_OTHER): Payer: 59

## 2014-11-10 DIAGNOSIS — E119 Type 2 diabetes mellitus without complications: Secondary | ICD-10-CM

## 2014-11-10 LAB — HEMOGLOBIN A1C: Hgb A1c MFr Bld: 7.1 % — ABNORMAL HIGH (ref 4.6–6.5)

## 2014-11-14 ENCOUNTER — Ambulatory Visit (INDEPENDENT_AMBULATORY_CARE_PROVIDER_SITE_OTHER): Payer: 59 | Admitting: Family Medicine

## 2014-11-14 ENCOUNTER — Encounter: Payer: Self-pay | Admitting: Family Medicine

## 2014-11-14 VITALS — BP 126/70 | HR 58 | Temp 97.8°F | Wt 231.0 lb

## 2014-11-14 DIAGNOSIS — E119 Type 2 diabetes mellitus without complications: Secondary | ICD-10-CM | POA: Diagnosis not present

## 2014-11-14 NOTE — Progress Notes (Signed)
Pre visit review using our clinic review tool, if applicable. No additional management support is needed unless otherwise documented below in the visit note. 

## 2014-11-14 NOTE — Progress Notes (Signed)
   Subjective:    Patient ID: Aaron Bradshaw, male    DOB: November 17, 1950, 64 y.o.   MRN: [id number]  HPI  Patient seen for medical follow-up. He has type 2 diabetes. Somewhat inconsistent exercise over recent months. Currently only takes metformin 500 mg once daily. Exercises several days per week. He had transverse myelitis year ago and has had slow gradual recovery from that. No significant neuropathy symptoms. No polyuria or polydipsia. Weight stable  Past Medical History  Diagnosis Date  . History of chest pain     Hospitalized ER in [place] for CP and Palpitations with Neg Enzymes  . Gilbert syndrome   . Testosterone deficiency     Dr Hartley Barefoot  . Diabetes mellitus 6/12    A1c 7.1 %   . Hyperlipidemia    Past Surgical History  Procedure Laterality Date  . Knee arthroscopy      Left  . Shoulder arthroscopy    . Nose surgery      Dr  Truman Hayward  . Colonoscopy      Tics, Polyp 12/26/2003; polyps 2010, Dr Carlean Purl  . Cholecystectomy  2010    reports that he has never smoked. He has never used smokeless tobacco. He reports that he drinks alcohol. He reports that he does not use illicit drugs. family history includes Arthritis in his mother; Depression in his father, mother, and sister; Diabetes in his maternal grandfather and mother; Heart disease in his father; OCD in his father; Parkinsonism in his mother; Prostate cancer in his father and paternal grandfather; Stroke (age of onset: 28) in his mother. Allergies  Allergen Reactions  . Codeine Nausea Only  . Fluoxetine Anxiety  . Prozac [Fluoxetine Hcl] Other (See Comments)    Increased anxiety     Review of Systems  Constitutional: Negative for appetite change and unexpected weight change.  Respiratory: Negative for shortness of breath.   Cardiovascular: Negative for chest pain.  Neurological: Negative for dizziness.       Objective:   Physical Exam  Constitutional: He appears well-developed and well-nourished.  Neck: Neck  supple.  Cardiovascular: Normal rate and regular rhythm.   Pulmonary/Chest: Effort normal and breath sounds normal. No respiratory distress. He has no wheezes. He has no rales.  Musculoskeletal: He exhibits no edema.  Lymphadenopathy:    He has no cervical adenopathy.  Skin:  Feet reveal no skin lesions. Good distal foot pulses. Good capillary refill. No calluses. Normal sensation with monofilament testing           Assessment & Plan:  Type 2 diabetes. History of fair control. We discussed option of increasing metformin but he prefers to work on increased exercise and reassess in 4 months. Recheck urine microalbumin then.

## 2014-12-01 ENCOUNTER — Encounter: Payer: Self-pay | Admitting: Family Medicine

## 2014-12-01 ENCOUNTER — Ambulatory Visit (INDEPENDENT_AMBULATORY_CARE_PROVIDER_SITE_OTHER): Payer: 59 | Admitting: Family Medicine

## 2014-12-01 VITALS — BP 130/70 | HR 57 | Temp 98.1°F | Wt 228.0 lb

## 2014-12-01 DIAGNOSIS — R252 Cramp and spasm: Secondary | ICD-10-CM | POA: Diagnosis not present

## 2014-12-01 NOTE — Patient Instructions (Signed)

## 2014-12-01 NOTE — Progress Notes (Signed)
Pre visit review using our clinic review tool, if applicable. No additional management support is needed unless otherwise documented below in the visit note. 

## 2014-12-01 NOTE — Progress Notes (Signed)
   Subjective:    Patient ID: Aaron Bradshaw, male    DOB: [birth year], 64 y.o.   MRN: [id number]  HPI Patient seen with muscle cramps involving his thighs. These occur somewhat intermittently. He is very active. He thinks he may not be drinking enough fluids. He takes multivitamin supplement daily. He does not take any diuretics. He read that sertraline can cause muscle cramps but has been on this for several months now. No statin use. No history of any circulatory issues. No history of hypokalemia or low magnesium.  Past Medical History  Diagnosis Date  . History of chest pain     Hospitalized ER in [place] for CP and Palpitations with Neg Enzymes  . Gilbert syndrome   . Testosterone deficiency     Dr Hartley Barefoot  . Diabetes mellitus 6/12    A1c 7.1 %   . Hyperlipidemia    Past Surgical History  Procedure Laterality Date  . Knee arthroscopy      Left  . Shoulder arthroscopy    . Nose surgery      Dr  Truman Hayward  . Colonoscopy      Tics, Polyp 12/26/2003; polyps 2010, Dr Carlean Purl  . Cholecystectomy  2010    reports that he has never smoked. He has never used smokeless tobacco. He reports that he drinks alcohol. He reports that he does not use illicit drugs. family history includes Arthritis in his mother; Depression in his father, mother, and sister; Diabetes in his maternal grandfather and mother; Heart disease in his father; OCD in his father; Parkinsonism in his mother; Prostate cancer in his father and paternal grandfather; Stroke (age of onset: 40) in his mother. Allergies  Allergen Reactions  . Codeine Nausea Only  . Fluoxetine Anxiety  . Prozac [Fluoxetine Hcl] Other (See Comments)    Increased anxiety      Review of Systems  Constitutional: Negative for fatigue.  Eyes: Negative for visual disturbance.  Respiratory: Negative for cough, chest tightness and shortness of breath.   Cardiovascular: Negative for chest pain, palpitations and leg swelling.  Neurological: Negative  for dizziness, syncope, weakness, light-headedness and headaches.       Objective:   Physical Exam  Constitutional: He appears well-developed and well-nourished. No distress.  Cardiovascular: Normal rate and regular rhythm.   Pulmonary/Chest: Effort normal and breath sounds normal. No respiratory distress. He has no wheezes. He has no rales.  Musculoskeletal: He exhibits no edema.          Assessment & Plan:  Muscle cramps. We discussed etiologies. Increase hydration. He'll consider tonic water. He has done well with low-dose magnesium supplementation in the past. If not getting relief with the above he will follow-up for further labs including basic metabolic panel and magnesium level

## 2015-01-08 ENCOUNTER — Ambulatory Visit (AMBULATORY_SURGERY_CENTER): Payer: Self-pay

## 2015-01-08 VITALS — Ht 75.0 in | Wt 233.2 lb

## 2015-01-08 DIAGNOSIS — Z860100 Personal history of colon polyps, unspecified: Secondary | ICD-10-CM

## 2015-01-08 NOTE — Progress Notes (Signed)
No allergies to eggs or soy No home oxygen No diet/weight loss meds No past problems with anesthesia  Has email  Emmi instructions given for colonoscopy 

## 2015-01-22 ENCOUNTER — Telehealth: Payer: Self-pay | Admitting: Family Medicine

## 2015-01-22 NOTE — Telephone Encounter (Signed)
Pt aware that meter is ready for pickup

## 2015-01-22 NOTE — Telephone Encounter (Signed)
Pt said he has one touch ultra mini and it does not seem to be working that good. He is asking if we can give him another machine .

## 2015-01-23 ENCOUNTER — Encounter: Payer: Self-pay | Admitting: Internal Medicine

## 2015-01-23 ENCOUNTER — Ambulatory Visit (AMBULATORY_SURGERY_CENTER): Payer: 59 | Admitting: Internal Medicine

## 2015-01-23 VITALS — BP 109/78 | HR 49 | Temp 98.4°F | Resp 22 | Ht 75.0 in | Wt 228.0 lb

## 2015-01-23 DIAGNOSIS — Z86010 Personal history of colonic polyps: Secondary | ICD-10-CM

## 2015-01-23 DIAGNOSIS — D120 Benign neoplasm of cecum: Secondary | ICD-10-CM

## 2015-01-23 LAB — GLUCOSE, CAPILLARY
GLUCOSE-CAPILLARY: 140 mg/dL — AB (ref 65–99)
GLUCOSE-CAPILLARY: 120 mg/dL — AB (ref 65–99)

## 2015-01-23 MED ORDER — SODIUM CHLORIDE 0.9 % IV SOLN: 500.0000 mL | INTRAVENOUS | Status: DC

## 2015-01-23 NOTE — Progress Notes (Signed)
To recovery, report to Smith, RN, VSS 

## 2015-01-23 NOTE — Op Note (Signed)
[place]  [address]. [place], [address]   COLONOSCOPY PROCEDURE REPORT  PATIENT: Aaron Bradshaw, Aaron Bradshaw  MR#: [id number] BIRTHDATE: 1950-12-21 , 64  yrs. old GENDER: male ENDOSCOPIST: Gatha Mayer, MD, [facility] PROCEDURE DATE:  01/23/2015 PROCEDURE:   Colonoscopy, surveillance and Colonoscopy with snare polypectomy First Screening Colonoscopy - Avg.  risk and is 50 yrs.  old or older - No.  Prior Negative Screening - Now for repeat screening. N/A  History of Adenoma - Now for follow-up colonoscopy & has been > or = to 3 yrs.  Yes hx of adenoma.  Has been 3 or more years since last colonoscopy.  Polyps removed today? Yes ASA CLASS:   Class II INDICATIONS:Surveillance due to prior colonic neoplasia and PH Colon Adenoma. MEDICATIONS: Propofol 250 mg IV and Monitored anesthesia care  DESCRIPTION OF PROCEDURE:   After the risks benefits and alternatives of the procedure were thoroughly explained, informed consent was obtained.  The digital rectal exam revealed no abnormalities of the rectum, revealed no prostatic nodules, and revealed the prostate was not enlarged.   The LB [id number] endoscope was introduced through the anus and advanced to the cecum, which was identified by both the appendix and ileocecal valve. No adverse events experienced.   The quality of the prep was excellent.  (MiraLax was used)  The instrument was then slowly withdrawn as the colon was fully examined. Estimated blood loss is zero unless otherwise noted in this procedure report.   COLON FINDINGS: A polypoid shaped sessile polyp measuring 10 mm in size was found at the appendiceal orifice and cecum.  A polypectomy was performed in a piecemeal fashion with a cold snare.  The resection was complete, the polyp tissue was completely retrieved and sent to histology.   There was diverticulosis noted in the sigmoid colon.   The examination was otherwise normal.  Retroflexed views revealed  no abnormalities. The time to cecum = 1.8 Withdrawal time = 12.3   The scope was withdrawn and the procedure completed. COMPLICATIONS: There were no immediate complications.  ENDOSCOPIC IMPRESSION: 1.   Sessile polyp was found at the appendiceal orifice and cecum; polypectomy was performed in a piecemeal fashion with a cold snare 2.   Diverticulosis was noted in the sigmoid colon 3.   The examination was otherwise normal - excellent prep  RECOMMENDATIONS: 1.  Timing of repeat colonoscopy will be determined by pathology findings. 2.  This will need 6-12 month follow-up if a pre-cancerous polyp  eSigned:  Gatha Mayer, MD, [facility] 01/23/2015 9:51 AM  cc: The Patient

## 2015-01-23 NOTE — Patient Instructions (Addendum)
I found and removed one polyp. It looks benign but was near appendix and may ned close follow-up to be sure its all gone. You also have a condition called diverticulosis - common and not usually a problem. Please read the handout provided.  I appreciate the opportunity to care for you. Gatha Mayer, MD, FACG  YOU HAD AN ENDOSCOPIC PROCEDURE TODAY AT [facility]:   Refer to the procedure report that was given to you for any specific questions about what was found during the examination.  If the procedure report does not answer your questions, please call your gastroenterologist to clarify.  If you requested that your care partner not be given the details of your procedure findings, then the procedure report has been included in a sealed envelope for you to review at your convenience later.  YOU SHOULD EXPECT: Some feelings of bloating in the abdomen. Passage of more gas than usual.  Walking can help get rid of the air that was put into your GI tract during the procedure and reduce the bloating. If you had a lower endoscopy (such as a colonoscopy or flexible sigmoidoscopy) you may notice spotting of blood in your stool or on the toilet paper. If you underwent a bowel prep for your procedure, you may not have a normal bowel movement for a few days.  Please Note:  You might notice some irritation and congestion in your nose or some drainage.  This is from the oxygen used during your procedure.  There is no need for concern and it should clear up in a day or so.  SYMPTOMS TO REPORT IMMEDIATELY:   Following lower endoscopy (colonoscopy or flexible sigmoidoscopy):  Excessive amounts of blood in the stool  Significant tenderness or worsening of abdominal pains  Swelling of the abdomen that is new, acute  Fever of 100F or higher    For urgent or emergent issues, a gastroenterologist can be reached at any hour by calling [phone].   DIET: Your first meal following  the procedure should be a small meal and then it is ok to progress to your normal diet. Heavy or fried foods are harder to digest and may make you feel nauseous or bloated.  Likewise, meals heavy in dairy and vegetables can increase bloating.  Drink plenty of fluids but you should avoid alcoholic beverages for 24 hours.  ACTIVITY:  You should plan to take it easy for the rest of today and you should NOT DRIVE or use heavy machinery until tomorrow (because of the sedation medicines used during the test).    FOLLOW UP: Our staff will call the number listed on your records the next business day following your procedure to check on you and address any questions or concerns that you may have regarding the information given to you following your procedure. If we do not reach you, we will leave a message.  However, if you are feeling well and you are not experiencing any problems, there is no need to return our call.  We will assume that you have returned to your regular daily activities without incident.  If any biopsies were taken you will be contacted by phone or by letter within the next 1-3 weeks.  Please call us at [phone] if you have not heard about the biopsies in 3 weeks.    SIGNATURES/CONFIDENTIALITY: You and/or your care partner have signed paperwork which will be entered into your electronic medical record.  These signatures attest  to the fact that that the information above on your After Visit Summary has been reviewed and is understood.  Full responsibility of the confidentiality of this discharge information lies with you and/or your care-partner. 

## 2015-01-23 NOTE — Progress Notes (Signed)
Called to room to assist during endoscopic procedure.  Patient ID and intended procedure confirmed with present staff. Received instructions for my participation in the procedure from the performing physician.  

## 2015-01-27 ENCOUNTER — Telehealth: Payer: Self-pay

## 2015-01-27 NOTE — Telephone Encounter (Signed)
  Follow up Call-  Call back number 01/23/2015  Post procedure Call Back phone  # [phone]  Permission to leave phone message Yes     Patient questions:  Do you have a fever, pain , or abdominal swelling? No. Pain Score  0 *  Have you tolerated food without any problems? Yes.    Have you been able to return to your normal activities? Yes.    Do you have any questions about your discharge instructions: Diet   No. Medications  No. Follow up visit  No.  Do you have questions or concerns about your Care? No.  Actions: * If pain score is 4 or above: No action needed, pain <4.

## 2015-02-02 ENCOUNTER — Encounter: Payer: Self-pay | Admitting: Internal Medicine

## 2015-02-02 DIAGNOSIS — Z86010 Personal history of colonic polyps: Secondary | ICD-10-CM

## 2015-02-02 NOTE — Progress Notes (Signed)
Quick Note:  10 mm ssp - repeat colonoscopy 6 months please ______

## 2015-02-03 ENCOUNTER — Ambulatory Visit (INDEPENDENT_AMBULATORY_CARE_PROVIDER_SITE_OTHER): Payer: 59 | Admitting: Family Medicine

## 2015-02-03 ENCOUNTER — Encounter: Payer: Self-pay | Admitting: Family Medicine

## 2015-02-03 VITALS — BP 138/80 | Ht 75.0 in | Wt 230.0 lb

## 2015-02-03 DIAGNOSIS — S46111A Strain of muscle, fascia and tendon of long head of biceps, right arm, initial encounter: Secondary | ICD-10-CM | POA: Diagnosis not present

## 2015-02-03 DIAGNOSIS — S46211A Strain of muscle, fascia and tendon of other parts of biceps, right arm, initial encounter: Secondary | ICD-10-CM

## 2015-02-03 NOTE — Progress Notes (Signed)
  Aaron Bradshaw - 64 y.o. male MRN [id number]  Date of birth: 1951-04-18  CC: Right shoulder Pain  SUBJECTIVE:    HPI  Aaron Bradshaw is a pleasant 64 yo male who presents with 10 days of RUE/shoulder discomfort. He was moving a piano when her felt a snap radiating from his right shoulder to his distal right upper arm.  His pain/discomfort and swelling have improved over the last weeks and is now relatively mild. He is having some weakness with elbow flexion.   He is fairly active at the [employer] and also swims 1 mile 3-4x/week. He denies any previous right shoulder issues. He has not needed to take any OTC medications for pain.   ROS:     As discussed in HPI.   HISTORY: Past Medical, Surgical, Social, and Family History Reviewed & Updated per EMR.  Pertinent Historical Findings include  OBJECTIVE: BP 138/80 mmHg  Ht 6\' 3"  (1.905 m)  Wt 230 lb (104.327 kg)  BMI 28.75 kg/m2  Physical Exam  No acute distress, sitting comfortably on the exam table.  Right shoulder/arm: Visible bruising on distal right upper arm with obvious popeye deformity. Virtually no pain with palpation over the anterior shoulder. Full painless range of motion.  Negative Neer's/Hawkin's. No tenderness over the a.c. joint or with cross of the arm. Rotator cuff strength is 5/5.  Symmetric forearm strength with supination bilaterally. Neurovascularly intact distally.  Limited Right Shoulder Ultrasound: Short axis view of the anterior shoulder revealed a couple strands of the long head of the biceps tendon in the intertubercular groove.  Long axis did not suggest an intact long head of the biceps tendon.   MEDICATIONS, LABS & OTHER ORDERS: Previous Medications   ASCORBIC ACID (VITAMIN C) 250 MG TABLET    Take 250 mg by mouth daily.     ASPIRIN 81 MG TABLET    Take 81 mg by mouth daily.     B COMPLEX VITAMINS CAPSULE    Take 1 capsule by mouth daily.     CHOLECALCIFEROL PO    Take 1 tablet by mouth daily.   MAGNESIUM PO    Take 1  tablet by mouth daily.   METFORMIN (GLUCOPHAGE-XR) 500 MG 24 HR TABLET    TAKE 1 TABLET (500 MG TOTAL) BY MOUTH DAILY WITH BREAKFAST.   MULTIPLE VITAMIN (MULTIVITAMIN) TABLET    Take 1 tablet by mouth daily.     ONE TOUCH ULTRA TEST TEST STRIP    TESTING BLOOD SUGAR ONE TIME PER DAILY. DX:250.00   SERTRALINE (ZOLOFT) 50 MG TABLET    TAKE 1 TABLET BY MOUTH EVERY DAY   Modified Medications   No medications on file   New Prescriptions   No medications on file   Discontinued Medications   No medications on file  No orders of the defined types were placed in this encounter.   ASSESSMENT & PLAN: See problem based charting & AVS for pt instructions.

## 2015-03-02 ENCOUNTER — Other Ambulatory Visit (INDEPENDENT_AMBULATORY_CARE_PROVIDER_SITE_OTHER): Payer: 59

## 2015-03-02 DIAGNOSIS — E119 Type 2 diabetes mellitus without complications: Secondary | ICD-10-CM

## 2015-03-02 LAB — HEMOGLOBIN A1C: Hgb A1c MFr Bld: 7.4 % — ABNORMAL HIGH (ref 4.6–6.5)

## 2015-03-02 LAB — MICROALBUMIN / CREATININE URINE RATIO
Creatinine,U: 56.1 mg/dL
Microalb Creat Ratio: 1.2 mg/g (ref 0.0–30.0)
Microalb, Ur: <0.7 mg/dL (ref 0.0–1.9)

## 2015-03-09 ENCOUNTER — Other Ambulatory Visit: Payer: 59

## 2015-03-16 ENCOUNTER — Encounter: Payer: Self-pay | Admitting: Family Medicine

## 2015-03-16 ENCOUNTER — Ambulatory Visit (INDEPENDENT_AMBULATORY_CARE_PROVIDER_SITE_OTHER): Payer: 59 | Admitting: Family Medicine

## 2015-03-16 VITALS — BP 130/80 | HR 58 | Temp 98.1°F | Wt 231.0 lb

## 2015-03-16 DIAGNOSIS — E119 Type 2 diabetes mellitus without complications: Secondary | ICD-10-CM

## 2015-03-16 DIAGNOSIS — F411 Generalized anxiety disorder: Secondary | ICD-10-CM | POA: Diagnosis not present

## 2015-03-16 LAB — HM DIABETES EYE EXAM

## 2015-03-16 LAB — HM DIABETES FOOT EXAM: HM Diabetic Foot Exam: NORMAL

## 2015-03-16 MED ORDER — SERTRALINE HCL 50 MG PO TABS
ORAL_TABLET | ORAL | Status: DC
Start: 2015-03-16 — End: 2016-04-06

## 2015-03-16 NOTE — Progress Notes (Signed)
Subjective:    Patient ID: Aaron Bradshaw, male    DOB: September 30, 1950, 64 y.o.   MRN: [id number]  HPI  patient seen for medical follow-up   Type 2 diabetes. Generally, has had good control. Poor compliance with exercise over the past couple of months. A1c 7.4%. Urine micro-albumin screen negative. Remains on metformin. No symptoms of hyper or hypoglycemia.    needs follow-up eye exam any plans to set this up soon. He has history of transverse myelitis and still has some residual mild paresthesias involving the bottoms of feet   Long history of chronic anxiety. Still sometimes wakes up at night with anxious feelings. On sertraline 50 mg once daily. He would like to consider titrating this up. Denies any adverse side effects. No depressed mood. No specific triggers.  Past Medical History  Diagnosis Date  . History of chest pain     Hospitalized ER in [place] for CP and Palpitations with Neg Enzymes  . Gilbert syndrome   . Testosterone deficiency     Dr Hartley Barefoot  . Diabetes mellitus 6/12    A1c 7.1 %   . Hyperlipidemia   . Cancer     basal cell  . Transverse myelitis 10/2013  . Shingles    Past Surgical History  Procedure Laterality Date  . Knee arthroscopy      Left  . Shoulder arthroscopy    . Nose surgery      Dr  Truman Hayward  . Colonoscopy      Tics, Polyp 12/26/2003; polyps 2010, Dr Carlean Purl  . Cholecystectomy  2010    reports that he has never smoked. He has never used smokeless tobacco. He reports that he drinks about 0.6 - 1.2 oz of alcohol per week. He reports that he does not use illicit drugs. family history includes Arthritis in his mother; Depression in his father, mother, and sister; Diabetes in his maternal grandfather and mother; Heart disease in his father; OCD in his father; Parkinsonism in his mother; Prostate cancer in his father and paternal grandfather; Stroke (age of onset: 85) in his mother. There is no history of Colon cancer. Allergies  Allergen Reactions  .  Codeine Nausea Only  . Fluoxetine Anxiety  . Prozac [Fluoxetine Hcl] Other (See Comments)    Increased anxiety        Review of Systems  Constitutional: Negative for appetite change, fatigue and unexpected weight change.  Eyes: Negative for visual disturbance.  Respiratory: Negative for cough, chest tightness and shortness of breath.   Cardiovascular: Negative for chest pain, palpitations and leg swelling.  Gastrointestinal: Negative for abdominal pain.  Endocrine: Negative for polydipsia and polyuria.  Neurological: Negative for dizziness, syncope, weakness, light-headedness and headaches.  Psychiatric/Behavioral: Positive for sleep disturbance. Negative for dysphoric mood. The patient is nervous/anxious.        Objective:   Physical Exam  Constitutional: He appears well-developed and well-nourished. No distress.  Neck: Neck supple. No thyromegaly present.  Cardiovascular: Normal rate and regular rhythm.   Pulmonary/Chest: Effort normal and breath sounds normal. No respiratory distress. He has no wheezes. He has no rales.  Musculoskeletal: He exhibits no edema.  Lymphadenopathy:    He has no cervical adenopathy.  Psychiatric: He has a normal mood and affect. His behavior is normal. Judgment and thought content normal.          Assessment & Plan:   #1 type 2 diabetes. Slightly sub-optimal control by recent labs. Discussed options with patient. He prefers to  tighten up diet and exercise and recheck in 4 months  #2 anxiety. Possible panic disorder. Poorly controlled. Titrate sertraline 75 mg at night and after one month touch base if not adequately controlled

## 2015-03-16 NOTE — Progress Notes (Signed)
Pre visit review using our clinic review tool, if applicable. No additional management support is needed unless otherwise documented below in the visit note. 

## 2015-06-11 LAB — HM DIABETES EYE EXAM

## 2015-06-16 ENCOUNTER — Encounter: Payer: Self-pay | Admitting: Family Medicine

## 2015-07-02 ENCOUNTER — Ambulatory Visit (INDEPENDENT_AMBULATORY_CARE_PROVIDER_SITE_OTHER): Payer: 59

## 2015-07-02 DIAGNOSIS — Z23 Encounter for immunization: Secondary | ICD-10-CM

## 2015-07-06 ENCOUNTER — Other Ambulatory Visit (INDEPENDENT_AMBULATORY_CARE_PROVIDER_SITE_OTHER): Payer: 59

## 2015-07-06 DIAGNOSIS — E119 Type 2 diabetes mellitus without complications: Secondary | ICD-10-CM

## 2015-07-06 LAB — HEMOGLOBIN A1C: HEMOGLOBIN A1C: 7.4 % — AB (ref 4.6–6.5)

## 2015-07-13 ENCOUNTER — Encounter: Payer: Self-pay | Admitting: Internal Medicine

## 2015-07-16 ENCOUNTER — Encounter: Payer: Self-pay | Admitting: Family Medicine

## 2015-07-16 ENCOUNTER — Ambulatory Visit (INDEPENDENT_AMBULATORY_CARE_PROVIDER_SITE_OTHER): Payer: 59 | Admitting: Family Medicine

## 2015-07-16 VITALS — BP 122/80 | HR 67 | Temp 98.1°F | Resp 16 | Ht 75.0 in | Wt 231.3 lb

## 2015-07-16 DIAGNOSIS — E119 Type 2 diabetes mellitus without complications: Secondary | ICD-10-CM

## 2015-07-16 MED ORDER — METFORMIN HCL ER 500 MG PO TB24: 500.0000 mg | ORAL_TABLET | Freq: Two times a day (BID) | ORAL | Status: DC

## 2015-07-16 NOTE — Patient Instructions (Addendum)
Go ahead and increase Metformin to one twice daily Schedule physical for sometime this Spring.

## 2015-07-16 NOTE — Progress Notes (Signed)
   Subjective:    Patient ID: Aaron Bradshaw, male    DOB: 04-Oct-1950, 64 y.o.   MRN: [id number]  HPI Follow-up type 2 diabetes Recent A1c 7.4%. Exercises fairly regularly but poor dietary compliance at times. No polyuria or polydipsia. Takes metformin extended release 500 mg once daily. Denies any side effects. Good compliance of medication. Flu vaccine already given. Gets regular eye exams  Past Medical History  Diagnosis Date  . History of chest pain     Hospitalized ER in [place] for CP and Palpitations with Neg Enzymes  . Gilbert syndrome   . Testosterone deficiency     Dr Hartley Barefoot  . Diabetes mellitus ([place]) 6/12    A1c 7.1 %   . Hyperlipidemia   . Cancer ([place])     basal cell  . Transverse myelitis ([place]) 10/2013  . Shingles    Past Surgical History  Procedure Laterality Date  . Knee arthroscopy      Left  . Shoulder arthroscopy    . Nose surgery      Dr  Truman Hayward  . Colonoscopy      Tics, Polyp 12/26/2003; polyps 2010, Dr Carlean Purl  . Cholecystectomy  2010    reports that he has never smoked. He has never used smokeless tobacco. He reports that he drinks about 0.6 - 1.2 oz of alcohol per week. He reports that he does not use illicit drugs. family history includes Arthritis in his mother; Depression in his father, mother, and sister; Diabetes in his maternal grandfather and mother; Heart disease in his father; OCD in his father; Parkinsonism in his mother; Prostate cancer in his father and paternal grandfather; Stroke (age of onset: 43) in his mother. There is no history of Colon cancer. Allergies  Allergen Reactions  . Codeine Nausea Only  . Fluoxetine Anxiety  . Prozac [Fluoxetine Hcl] Other (See Comments)    Increased anxiety      Review of Systems  Constitutional: Negative for fatigue.  Eyes: Negative for visual disturbance.  Respiratory: Negative for cough, chest tightness and shortness of breath.   Cardiovascular: Negative for chest pain, palpitations and leg  swelling.  Endocrine: Negative for polydipsia and polyuria.  Neurological: Negative for dizziness, syncope, weakness, light-headedness and headaches.       Objective:   Physical Exam  Constitutional: He is oriented to person, place, and time. He appears well-developed and well-nourished.  HENT:  Right Ear: External ear normal.  Left Ear: External ear normal.  Mouth/Throat: Oropharynx is clear and moist.  Eyes: Pupils are equal, round, and reactive to light.  Neck: Neck supple. No thyromegaly present.  Cardiovascular: Normal rate and regular rhythm.   Pulmonary/Chest: Effort normal and breath sounds normal. No respiratory distress. He has no wheezes. He has no rales.  Musculoskeletal: He exhibits no edema.  Neurological: He is alert and oriented to person, place, and time.          Assessment & Plan:  Type 2 diabetes. Slightly suboptimal control. Increase metformin extended release to 500 mg  1 twice daily. Schedule physical for this pre-and recheck A1c then

## 2015-07-16 NOTE — Progress Notes (Signed)
Pre visit review using our clinic review tool, if applicable. No additional management support is needed unless otherwise documented below in the visit note. 

## 2015-07-30 ENCOUNTER — Ambulatory Visit (AMBULATORY_SURGERY_CENTER): Payer: Self-pay

## 2015-07-30 DIAGNOSIS — Z860100 Personal history of colon polyps, unspecified: Secondary | ICD-10-CM

## 2015-07-30 NOTE — Progress Notes (Signed)
No allergies to eggs or soy No past problems with anesthesia No home oxygen No diet/weight loss meds  Has email and internet; refused emmi 

## 2015-07-31 ENCOUNTER — Encounter: Payer: Self-pay | Admitting: Family Medicine

## 2015-07-31 ENCOUNTER — Ambulatory Visit (INDEPENDENT_AMBULATORY_CARE_PROVIDER_SITE_OTHER): Payer: 59 | Admitting: Family Medicine

## 2015-07-31 ENCOUNTER — Ambulatory Visit: Payer: 59 | Admitting: Family Medicine

## 2015-07-31 VITALS — BP 139/68 | HR 52 | Temp 98.0°F | Ht 75.0 in | Wt 226.0 lb

## 2015-07-31 DIAGNOSIS — A084 Viral intestinal infection, unspecified: Secondary | ICD-10-CM | POA: Diagnosis not present

## 2015-07-31 NOTE — Progress Notes (Signed)
   Subjective:    Patient ID: Aaron Bradshaw, male    DOB: 1951-02-22, 65 y.o.   MRN: [id number]  HPI Here for advice about his GI tract. 3 days ago he developed some nausea without vomiting and some diarrhea. No cramps or pain, no fever. He feels much better today, and he is able to eat and drink today. No other symptoms. He is scheduled for a colonoscopy next Wednesday and he asks if he will be able to have this done.    Review of Systems  Constitutional: Negative.   Respiratory: Negative.   Cardiovascular: Negative.   Gastrointestinal: Positive for nausea and diarrhea. Negative for vomiting, abdominal pain, constipation, blood in stool, abdominal distention and rectal pain.  Genitourinary: Negative.        Objective:   Physical Exam  Constitutional: He appears well-developed and well-nourished.  Cardiovascular: Normal rate, regular rhythm, normal heart sounds and intact distal pulses.   Pulmonary/Chest: Effort normal and breath sounds normal.  Abdominal: Soft. Bowel sounds are normal. He exhibits no distension and no mass. There is no tenderness. There is no rebound and no guarding.          Assessment & Plan:  Viral enteritis. He seems to be at the end of this. He should be able to proceed with the colonoscopy as planned.

## 2015-07-31 NOTE — Progress Notes (Signed)
Pre visit review using our clinic review tool, if applicable. No additional management support is needed unless otherwise documented below in the visit note. 

## 2015-08-05 ENCOUNTER — Encounter: Payer: Self-pay | Admitting: Internal Medicine

## 2015-08-05 ENCOUNTER — Ambulatory Visit (AMBULATORY_SURGERY_CENTER): Payer: 59 | Admitting: Internal Medicine

## 2015-08-05 VITALS — BP 120/65 | HR 45 | Temp 97.1°F | Resp 126 | Ht 75.0 in | Wt 226.0 lb

## 2015-08-05 DIAGNOSIS — K388 Other specified diseases of appendix: Secondary | ICD-10-CM | POA: Diagnosis not present

## 2015-08-05 DIAGNOSIS — Z86010 Personal history of colonic polyps: Secondary | ICD-10-CM | POA: Diagnosis present

## 2015-08-05 DIAGNOSIS — D121 Benign neoplasm of appendix: Secondary | ICD-10-CM

## 2015-08-05 LAB — GLUCOSE, CAPILLARY
Glucose-Capillary: 133 mg/dL — ABNORMAL HIGH (ref 65–99)
Glucose-Capillary: 124 mg/dL — ABNORMAL HIGH (ref 65–99)

## 2015-08-05 MED ORDER — SODIUM CHLORIDE 0.9 % IV SOLN: 500.0000 mL | INTRAVENOUS | Status: DC

## 2015-08-05 NOTE — Progress Notes (Signed)
Called to room to assist during endoscopic procedure.  Patient ID and intended procedure confirmed with present staff. Received instructions for my participation in the procedure from the performing physician.  

## 2015-08-05 NOTE — Progress Notes (Signed)
Patient denies any allergies to eggs or soy. 

## 2015-08-05 NOTE — Op Note (Signed)
[facility]  [address] [place], [id number]   COLONOSCOPY PROCEDURE REPORT  PATIENT: Aaron Bradshaw, Aaron Bradshaw  MR#: [id number] BIRTHDATE: 1950/09/15 , 64  yrs. old GENDER: male ENDOSCOPIST: Gatha Mayer, Aaron Bradshaw, [facility] PROCEDURE DATE:  08/05/2015 PROCEDURE:   Colonoscopy, diagnostic and Colonoscopy with biopsy First Screening Colonoscopy - Avg.  risk and is 50 yrs.  old or older - No.  Prior Negative Screening - Now for repeat screening. N/A  History of Adenoma - Now for follow-up colonoscopy & has been > or = to 3 yrs.  No.  It has been less than 3 yrs since last colonoscopy.  Medical reason.  Polyps removed today? Yes ASA CLASS:   Class II INDICATIONS:Excision of colonic polyp and Patient is not applicable for Colorectal Neoplasm Risk Assessment for this procedure. MEDICATIONS: Propofol 300 mg IV and Monitored anesthesia care  DESCRIPTION OF PROCEDURE:   After the risks benefits and alternatives of the procedure were thoroughly explained, informed consent was obtained.  The digital rectal exam revealed no abnormalities of the rectum, revealed no prostatic nodules, and revealed the prostate was not enlarged.   The LB [id number] endoscope was introduced through the anus and advanced to the cecum, which was identified by both the appendix and ileocecal valve. No adverse events experienced.   The quality of the prep was excellent.  (MiraLax was used)  The instrument was then slowly withdrawn as the colon was fully examined. Estimated blood loss is zero unless otherwise noted in this procedure report.  COLON FINDINGS: Suspect persistent polyp at/in appendiceal orifice - biopsies taken and sent to pathology.   There was diverticulosis noted in the sigmoid colon.   The examination was otherwise normal. Retroflexed views revealed no abnormalities. The time to cecum = 2.9 Withdrawal time = 10.4   The scope was withdrawn and the procedure completed. COMPLICATIONS:  There were no immediate complications.  ENDOSCOPIC IMPRESSION: 1.   Suspect persistent polyp at/in appendiceal orifice - biopsies taken and sent to pathology 2.   Diverticulosis was noted in the sigmoid colon 3.   The examination was otherwise normal - excellent prep  RECOMMENDATIONS: 1.  Timing of repeat colonoscopy will be determined by pathology findings. 2.  Likely to need appendectomy if there is polyp tissue in biopsies   eSigned:  Gatha Mayer, Aaron Bradshaw, [facility] 08/05/2015 11:03 AM   cc: The Patient

## 2015-08-05 NOTE — Progress Notes (Signed)
Report to PACU, RN, vss, BBS= Clear.  

## 2015-08-05 NOTE — Patient Instructions (Addendum)
I took biopsies near and in the appendix today - I think the polyp is not gone and continuing to grow but need the biopsies for guidance.  I will let you know results and recommendations - I may need to recommend you see a surgeon for an appendectomy.  I appreciate the opportunity to care for you. Gatha Mayer, MD, FACG   YOU HAD AN ENDOSCOPIC PROCEDURE TODAY AT [facility]:   Refer to the procedure report that was given to you for any specific questions about what was found during the examination.  If the procedure report does not answer your questions, please call your gastroenterologist to clarify.  If you requested that your care partner not be given the details of your procedure findings, then the procedure report has been included in a sealed envelope for you to review at your convenience later.  YOU SHOULD EXPECT: Some feelings of bloating in the abdomen. Passage of more gas than usual.  Walking can help get rid of the air that was put into your GI tract during the procedure and reduce the bloating. If you had a lower endoscopy (such as a colonoscopy or flexible sigmoidoscopy) you may notice spotting of blood in your stool or on the toilet paper. If you underwent a bowel prep for your procedure, you may not have a normal bowel movement for a few days.  Please Note:  You might notice some irritation and congestion in your nose or some drainage.  This is from the oxygen used during your procedure.  There is no need for concern and it should clear up in a day or so.  SYMPTOMS TO REPORT IMMEDIATELY:   Following lower endoscopy (colonoscopy or flexible sigmoidoscopy):  Excessive amounts of blood in the stool  Significant tenderness or worsening of abdominal pains  Swelling of the abdomen that is new, acute  Fever of 100F or higher    For urgent or emergent issues, a gastroenterologist can be reached at any hour by calling [phone].   DIET: Your first  meal following the procedure should be a small meal and then it is ok to progress to your normal diet. Heavy or fried foods are harder to digest and may make you feel nauseous or bloated.  Likewise, meals heavy in dairy and vegetables can increase bloating.  Drink plenty of fluids but you should avoid alcoholic beverages for 24 hours.  ACTIVITY:  You should plan to take it easy for the rest of today and you should NOT DRIVE or use heavy machinery until tomorrow (because of the sedation medicines used during the test).    FOLLOW UP: Our staff will call the number listed on your records the next business day following your procedure to check on you and address any questions or concerns that you may have regarding the information given to you following your procedure. If we do not reach you, we will leave a message.  However, if you are feeling well and you are not experiencing any problems, there is no need to return our call.  We will assume that you have returned to your regular daily activities without incident.  If any biopsies were taken you will be contacted by phone or by letter within the next 1-3 weeks.  Please call us at [phone] if you have not heard about the biopsies in 3 weeks.    SIGNATURES/CONFIDENTIALITY: You and/or your care partner have signed paperwork which will be entered into your  electronic medical record.  These signatures attest to the fact that that the information above on your After Visit Summary has been reviewed and is understood.  Full responsibility of the confidentiality of this discharge information lies with you and/or your care-partner.   Information on polyps and diverticulosis given to you today  Await biopsy results

## 2015-08-06 ENCOUNTER — Telehealth: Payer: Self-pay

## 2015-08-06 NOTE — Telephone Encounter (Signed)
  Follow up Call-  Call back number 08/05/2015 01/23/2015  Post procedure Call Back phone  # [phone] or [phone] wife's [phone]  Permission to leave phone message Yes Yes     Patient was called for follow up after procedure on 08/05/2015. Numerous attempts were called at all three numbers that were given. The line was busy so I was unable to leave a message.

## 2015-08-10 NOTE — Progress Notes (Signed)
Quick Note:  Let him know from office that there is polyp still in the appendix so he needs surgery consult re: pre-cancerous polyp in appendix - he has seen Dr. Hulen Skains in the past (chole) - I explained that this was likely at the colonoscopy  Needs 3 yr colon recall please from [facility] - no letter from [facility]   ______

## 2015-08-11 ENCOUNTER — Other Ambulatory Visit: Payer: Self-pay | Admitting: Family Medicine

## 2015-08-18 ENCOUNTER — Ambulatory Visit: Payer: Self-pay | Admitting: General Surgery

## 2015-09-04 ENCOUNTER — Encounter: Payer: Self-pay | Admitting: Internal Medicine

## 2015-09-24 NOTE — Pre-Procedure Instructions (Signed)
Nayef A Currie  09/24/2015      [id number] - [place], [place] - [address] [place] [address] Phone: [phone] Fax: [phone]    Your procedure is scheduled on March 13th, Monday   Report to [facility] Admitting at 5:30 AM             (Posted surgery time 7:30 - 8:30 am)   Call this number if you have problems the morning of surgery:  [phone]   Remember:  Do not eat food or drink liquids after midnight Sunday.  Take these medicines the morning of surgery with A SIP OF WATER : Zoloft                         4-5 days prior to surgery, STOP taking any vitamins, herbal medications or supplements, anti-inflammatories.   Do not wear jewelry - no rings or watches.  Do not wear lotions or colognes.   You may NOT wear deodorant the day of surgery.              Men may shave face and neck.  Do not bring valuables to the hospital.  [facility] is not responsible for any belongings or valuables.  Contacts, dentures or bridgework may not be worn into surgery.  Leave your suitcase in the car.  After surgery it may be brought to your room. For patients admitted to the hospital, discharge time will be determined by your treatment team.  Patients discharged the day of surgery will not be allowed to drive home.   Name and phone number of your driver:     Please read over the following fact sheets that you were given. Pain Booklet, Coughing and Deep Breathing and Surgical Site Infection Prevention

## 2015-09-25 ENCOUNTER — Encounter (HOSPITAL_COMMUNITY)
Admission: RE | Admit: 2015-09-25 | Discharge: 2015-09-25 | Disposition: A | Discharge status: Home / Self Care | Payer: 59 | Source: Ambulatory Visit | Attending: General Surgery | Admitting: General Surgery

## 2015-09-25 ENCOUNTER — Encounter (HOSPITAL_COMMUNITY): Payer: Self-pay

## 2015-09-25 DIAGNOSIS — R001 Bradycardia, unspecified: Secondary | ICD-10-CM | POA: Insufficient documentation

## 2015-09-25 DIAGNOSIS — I440 Atrioventricular block, first degree: Secondary | ICD-10-CM | POA: Insufficient documentation

## 2015-09-25 DIAGNOSIS — K388 Other specified diseases of appendix: Secondary | ICD-10-CM | POA: Insufficient documentation

## 2015-09-25 DIAGNOSIS — Z01812 Encounter for preprocedural laboratory examination: Secondary | ICD-10-CM | POA: Insufficient documentation

## 2015-09-25 DIAGNOSIS — Z01818 Encounter for other preprocedural examination: Secondary | ICD-10-CM

## 2015-09-25 HISTORY — DX: Anxiety disorder, unspecified: F41.9

## 2015-09-25 HISTORY — DX: Cardiac murmur, unspecified: R01.1

## 2015-09-25 LAB — BASIC METABOLIC PANEL
Anion gap: 9 (ref 5–15)
BUN: 19 mg/dL (ref 6–20)
CALCIUM: 9.5 mg/dL (ref 8.9–10.3)
CO2: 26 mmol/L (ref 22–32)
Chloride: 103 mmol/L (ref 101–111)
Creatinine, Ser: 0.9 mg/dL (ref 0.61–1.24)
GFR calc Af Amer: >60 mL/min (ref >60)
GLUCOSE: 131 mg/dL — AB (ref 65–99)
Potassium: 4.5 mmol/L (ref 3.5–5.1)
Sodium: 138 mmol/L (ref 135–145)

## 2015-09-25 LAB — GLUCOSE, CAPILLARY: GLUCOSE-CAPILLARY: 131 mg/dL — AB (ref 65–99)

## 2015-09-25 LAB — CBC WITH DIFFERENTIAL/PLATELET
Basophils Absolute: 0 10*3/uL (ref 0.0–0.1)
Basophils Relative: 1 %
EOS PCT: 2 %
Eosinophils Absolute: 0.1 10*3/uL (ref 0.0–0.7)
HEMATOCRIT: 45.4 % (ref 39.0–52.0)
Hemoglobin: 15.5 g/dL (ref 13.0–17.0)
LYMPHS PCT: 31 %
Lymphs Abs: 1.8 10*3/uL (ref 0.7–4.0)
MCH: 30.1 pg (ref 26.0–34.0)
MCHC: 34.1 g/dL (ref 30.0–36.0)
MCV: 88.2 fL (ref 78.0–100.0)
MONO ABS: 0.4 10*3/uL (ref 0.1–1.0)
MONOS PCT: 7 %
Neutro Abs: 3.5 10*3/uL (ref 1.7–7.7)
Neutrophils Relative %: 59 %
PLATELETS: 164 10*3/uL (ref 150–400)
RBC: 5.15 MIL/uL (ref 4.22–5.81)
RDW: 12.8 % (ref 11.5–15.5)
WBC: 5.9 10*3/uL (ref 4.0–10.5)

## 2015-09-25 NOTE — Progress Notes (Addendum)
Hbg A1C back in 07/06/2015 was 7.4. He states "long time ago, maybe 15-20 yrs I saw a cardiologist because I was having palpitations".  Tests run and negative result.  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  Lasted 2-3 mths, where he had no 'feeling from mid chest down'    No problems or flareup since.  He has gotten the shingle vaccination

## 2015-09-26 LAB — HEMOGLOBIN A1C
HEMOGLOBIN A1C: 7.3 % — AB (ref 4.8–5.6)
MEAN PLASMA GLUCOSE: 163 mg/dL

## 2015-10-04 NOTE — H&P (Signed)
Aaron Bradshaw 08/18/2015 10:53 AM Location: [facility] Patient #: [id number] DOB: [birth year] Married / Language: Cleophus Molt / Race: White Male  History of Present Illness Aaron Bradshaw. Aaron Skains MD; 08/18/2015 11:29 AM) The patient is a 65 year old male who presents with a colonic polyp. The patient was referred by a gastroenterologist (Dr. Carlean Purl for appendiceal polyp incompletely excised.). 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 Detected on routine colonoscopy  Other Problems Marjean Donna, CMA; 08/18/2015 10:53 AM) Anxiety Disorder Back Pain Cancer Cholelithiasis Diabetes Mellitus Diverticulosis Heart murmur Hemorrhoids Other disease, cancer, significant illness  Past Surgical History Marjean Donna, CMA; 08/18/2015 10:53 AM) Colon Polyp Removal - Colonoscopy Gallbladder Surgery - Laparoscopic Knee Surgery Left. Shoulder Surgery Left.  Diagnostic Studies History Marjean Donna, CMA; 08/18/2015 10:53 AM) Colonoscopy within last year  Allergies (Sonya Bynum, CMA; 08/18/2015 10:54 AM) Codeine Phosphate *ANALGESICS - OPIOID* FLUoxetine HCl (PMDD) *PSYCHOTHERAPEUTIC AND NEUROLOGICAL AGENTS - MISC.* PROzac *ANTIDEPRESSANTS*  Medication History Davy Pique Bynum, CMA; 08/18/2015 10:55 AM) Sertraline HCl (50MG  Tablet, Oral) Active. MetFORMIN HCl ER (500MG  Tablet ER 24HR, Oral two times daily, as needed) Active. Medications Reconciled  Social History Marjean Donna, CMA; 08/18/2015 10:53 AM) Alcohol use Occasional alcohol use. No caffeine use No drug use Tobacco use Never smoker.  Family History Marjean Donna, [place]; 08/18/2015 10:53 AM) Alcohol Abuse Brother. Arthritis Mother. Cerebrovascular Accident Father, Mother. Depression Father. Diabetes Mellitus Mother. Heart Disease Father. Hypertension Mother. Prostate Cancer Father.    Review of Systems Davy Pique Bynum CMA; 08/18/2015 10:53 AM) General Not Present- Appetite Loss, Chills, Fatigue,  Fever, Night Sweats, Weight Gain and Weight Loss. Skin Not Present- Change in Wart/Mole, Dryness, Hives, Jaundice, New Lesions, Non-Healing Wounds, Rash and Ulcer. HEENT Present- Seasonal Allergies and Wears glasses/contact lenses. Not Present- Earache, Hearing Loss, Hoarseness, Nose Bleed, Oral Ulcers, Ringing in the Ears, Sinus Pain, Sore Throat, Visual Disturbances and Yellow Eyes. Breast Not Present- Breast Mass, Breast Pain, Nipple Discharge and Skin Changes. Cardiovascular Not Present- Chest Pain, Difficulty Breathing Lying Down, Leg Cramps, Palpitations, Rapid Heart Rate, Shortness of Breath and Swelling of Extremities. Gastrointestinal Present- Hemorrhoids. Not Present- Abdominal Pain, Bloating, Bloody Stool, Change in Bowel Habits, Chronic diarrhea, Constipation, Difficulty Swallowing, Excessive gas, Gets full quickly at meals, Indigestion, Nausea, Rectal Pain and Vomiting. Male Genitourinary Not Present- Blood in Urine, Change in Urinary Stream, Frequency, Impotence, Nocturia, Painful Urination, Urgency and Urine Leakage. Musculoskeletal Present- Joint Pain and Joint Stiffness. Not Present- Back Pain, Muscle Pain, Muscle Weakness and Swelling of Extremities. Psychiatric Present- Anxiety. Not Present- Bipolar, Change in Sleep Pattern, Depression, Fearful and Frequent crying. Endocrine Not Present- Cold Intolerance, Excessive Hunger, Hair Changes, Heat Intolerance, Hot flashes and New Diabetes. Hematology Not Present- Easy Bruising, Excessive bleeding, Gland problems, HIV and Persistent Infections.  Vitals (Sonya Bynum CMA; 08/18/2015 10:53 AM) 08/18/2015 10:53 AM Weight: 231 lb Height: 75in Body Surface Area: 2.33 m Body Mass Index: 28.87 kg/m  Temp.: 51F(Temporal)  Pulse: 73 (Regular)  BP: 124/78 (Sitting, Left Arm, Standard)  Physical Exam (Tayo Maute O. Aaron Skains MD; 08/18/2015 11:30 AM) General Mental Status-Alert. General Appearance-Well groomed. Orientation-Oriented  X4. Build & Nutrition-Asthenic. Hydration-Well hydrated. Voice-Normal.  Chest and Lung Exam Chest and lung exam reveals -normal excursion with symmetric chest walls.  Cardiovascular Cardiovascular examination reveals -on palpation PMI is normal in location and amplitude, no palpable S3 or S4. Normal cardiac borders., normal heart sounds, regular rate and rhythm with no murmurs and femoral artery auscultation bilaterally reveals normal pulses, no bruits, no thrills.  Abdomen Inspection Incisional  scars - Laparoscopy scars(Previous lap chole by me, but cannot visualize scars). Palpation/Percussion Palpation and Percussion of the abdomen reveal - Soft and Non Tender. Auscultation Auscultation of the abdomen reveals - Bowel sounds normal.    Assessment & Plan Jeneen Rinks O. 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 Will schedule for lap appendectomy Current Plans Pt Education - Colonic Diseases: discussed with patient and provided information.  ADDENDUM:  P 49, BP good.  Low heart rate has to be monitored with CO2 insufflation.  Aaron Bradshaw. Dahlia Bailiff, MD, [place] [phone] [facility]

## 2015-10-05 ENCOUNTER — Ambulatory Visit (HOSPITAL_COMMUNITY): Payer: 59 | Admitting: Anesthesiology

## 2015-10-05 ENCOUNTER — Encounter (HOSPITAL_COMMUNITY): Payer: Self-pay | Admitting: Surgery

## 2015-10-05 ENCOUNTER — Ambulatory Visit (HOSPITAL_COMMUNITY)
Admission: RE | Admit: 2015-10-05 | Discharge: 2015-10-05 | Disposition: A | Discharge status: Home / Self Care | Payer: 59 | Source: Ambulatory Visit | Attending: General Surgery | Admitting: General Surgery

## 2015-10-05 ENCOUNTER — Encounter (HOSPITAL_COMMUNITY)
Admission: RE | Disposition: A | Discharge status: Home / Self Care | Payer: Self-pay | Source: Ambulatory Visit | Attending: General Surgery

## 2015-10-05 DIAGNOSIS — D373 Neoplasm of uncertain behavior of appendix: Secondary | ICD-10-CM | POA: Diagnosis present

## 2015-10-05 DIAGNOSIS — Z7984 Long term (current) use of oral hypoglycemic drugs: Secondary | ICD-10-CM | POA: Diagnosis not present

## 2015-10-05 DIAGNOSIS — E119 Type 2 diabetes mellitus without complications: Secondary | ICD-10-CM | POA: Insufficient documentation

## 2015-10-05 DIAGNOSIS — D121 Benign neoplasm of appendix: Secondary | ICD-10-CM | POA: Diagnosis not present

## 2015-10-05 HISTORY — PX: LAPAROSCOPIC APPENDECTOMY: SHX408

## 2015-10-05 LAB — GLUCOSE, CAPILLARY
GLUCOSE-CAPILLARY: 155 mg/dL — AB (ref 65–99)
Glucose-Capillary: 148 mg/dL — ABNORMAL HIGH (ref 65–99)

## 2015-10-05 SURGERY — APPENDECTOMY, LAPAROSCOPIC
Anesthesia: General | Site: Abdomen

## 2015-10-05 MED ORDER — FENTANYL CITRATE (PF) 100 MCG/2ML IJ SOLN
25.0000 ug | INTRAMUSCULAR | Status: DC | PRN
  Administered 2015-10-05 (×2): 25 ug via INTRAVENOUS

## 2015-10-05 MED ORDER — MIDAZOLAM HCL 2 MG/2ML IJ SOLN
INTRAMUSCULAR | Status: AC
  Filled 2015-10-05: qty 2

## 2015-10-05 MED ORDER — MEPERIDINE HCL 25 MG/ML IJ SOLN: 6.2500 mg | INTRAMUSCULAR | Status: DC | PRN

## 2015-10-05 MED ORDER — PROPOFOL 10 MG/ML IV BOLUS
INTRAVENOUS | Status: AC
  Filled 2015-10-05: qty 20

## 2015-10-05 MED ORDER — CEFOTETAN DISODIUM-DEXTROSE 2-2.08 GM-% IV SOLR
2.0000 g | INTRAVENOUS | Status: AC
  Administered 2015-10-05: 2 g via INTRAVENOUS
  Filled 2015-10-05: qty 50

## 2015-10-05 MED ORDER — PROMETHAZINE HCL 25 MG/ML IJ SOLN: 6.2500 mg | INTRAMUSCULAR | Status: DC | PRN

## 2015-10-05 MED ORDER — SUCCINYLCHOLINE CHLORIDE 20 MG/ML IJ SOLN
INTRAMUSCULAR | Status: AC
  Filled 2015-10-05: qty 1

## 2015-10-05 MED ORDER — BUPIVACAINE-EPINEPHRINE 0.25% -1:200000 IJ SOLN
INTRAMUSCULAR | Status: DC | PRN
Start: 2015-10-05 — End: 2015-10-05
  Administered 2015-10-05: 8 mL

## 2015-10-05 MED ORDER — GLYCOPYRROLATE 0.2 MG/ML IJ SOLN
INTRAMUSCULAR | Status: AC
  Filled 2015-10-05: qty 1

## 2015-10-05 MED ORDER — FENTANYL CITRATE (PF) 250 MCG/5ML IJ SOLN
INTRAMUSCULAR | Status: AC
  Filled 2015-10-05: qty 5

## 2015-10-05 MED ORDER — FENTANYL CITRATE (PF) 100 MCG/2ML IJ SOLN
INTRAMUSCULAR | Status: AC
  Filled 2015-10-05: qty 2

## 2015-10-05 MED ORDER — OXYCODONE-ACETAMINOPHEN 5-325 MG PO TABS: 1.0000 | ORAL_TABLET | ORAL | Status: DC | PRN

## 2015-10-05 MED ORDER — 0.9 % SODIUM CHLORIDE (POUR BTL) OPTIME
TOPICAL | Status: DC | PRN
  Administered 2015-10-05: 1000 mL

## 2015-10-05 MED ORDER — ARTIFICIAL TEARS OP OINT
TOPICAL_OINTMENT | OPHTHALMIC | Status: AC
  Filled 2015-10-05: qty 3.5

## 2015-10-05 MED ORDER — CHLORHEXIDINE GLUCONATE 4 % EX LIQD: 1.0000 "application " | Freq: Once | CUTANEOUS | Status: DC

## 2015-10-05 MED ORDER — FENTANYL CITRATE (PF) 100 MCG/2ML IJ SOLN
INTRAMUSCULAR | Status: DC | PRN
Start: 2015-10-05 — End: 2015-10-05
  Administered 2015-10-05: 200 ug via INTRAVENOUS
  Administered 2015-10-05: 50 ug via INTRAVENOUS

## 2015-10-05 MED ORDER — PROPOFOL 10 MG/ML IV BOLUS
INTRAVENOUS | Status: DC | PRN
  Administered 2015-10-05: 200 mg via INTRAVENOUS

## 2015-10-05 MED ORDER — ROCURONIUM BROMIDE 50 MG/5ML IV SOLN
INTRAVENOUS | Status: AC
  Filled 2015-10-05: qty 1

## 2015-10-05 MED ORDER — EPHEDRINE SULFATE 50 MG/ML IJ SOLN
INTRAMUSCULAR | Status: AC
  Filled 2015-10-05: qty 1

## 2015-10-05 MED ORDER — LACTATED RINGERS IV SOLN: INTRAVENOUS | Status: DC

## 2015-10-05 MED ORDER — LIDOCAINE HCL (CARDIAC) 20 MG/ML IV SOLN
INTRAVENOUS | Status: AC
Start: 2015-10-05 — End: 2015-10-05
  Filled 2015-10-05: qty 5

## 2015-10-05 MED ORDER — LIDOCAINE HCL (CARDIAC) 20 MG/ML IV SOLN
INTRAVENOUS | Status: AC
  Filled 2015-10-05: qty 5

## 2015-10-05 MED ORDER — BUPIVACAINE-EPINEPHRINE (PF) 0.25% -1:200000 IJ SOLN
INTRAMUSCULAR | Status: AC
Start: 2015-10-05 — End: 2015-10-05
  Filled 2015-10-05: qty 30

## 2015-10-05 MED ORDER — ROCURONIUM BROMIDE 100 MG/10ML IV SOLN
INTRAVENOUS | Status: DC | PRN
Start: 2015-10-05 — End: 2015-10-05
  Administered 2015-10-05: 40 mg via INTRAVENOUS
  Administered 2015-10-05: 5 mg via INTRAVENOUS

## 2015-10-05 MED ORDER — LIDOCAINE HCL (CARDIAC) 20 MG/ML IV SOLN
INTRAVENOUS | Status: DC | PRN
  Administered 2015-10-05: 100 mg via INTRAVENOUS

## 2015-10-05 MED ORDER — LIDOCAINE HCL 4 % EX SOLN
CUTANEOUS | Status: DC | PRN
  Administered 2015-10-05: 2 mL via TOPICAL

## 2015-10-05 MED ORDER — ARTIFICIAL TEARS OP OINT
TOPICAL_OINTMENT | OPHTHALMIC | Status: DC | PRN
  Administered 2015-10-05: 1 via OPHTHALMIC

## 2015-10-05 MED ORDER — LACTATED RINGERS IV SOLN
INTRAVENOUS | Status: DC | PRN
  Administered 2015-10-05 (×2): via INTRAVENOUS

## 2015-10-05 MED ORDER — SODIUM CHLORIDE 0.9 % IJ SOLN
INTRAMUSCULAR | Status: AC
  Filled 2015-10-05: qty 10

## 2015-10-05 MED ORDER — SUGAMMADEX SODIUM 200 MG/2ML IV SOLN
INTRAVENOUS | Status: DC | PRN
  Administered 2015-10-05: 200 mg via INTRAVENOUS

## 2015-10-05 MED ORDER — SUGAMMADEX SODIUM 200 MG/2ML IV SOLN
INTRAVENOUS | Status: AC
  Filled 2015-10-05: qty 2

## 2015-10-05 MED ORDER — SODIUM CHLORIDE 0.9 % IR SOLN
Status: DC | PRN
  Administered 2015-10-05: 1000 mL

## 2015-10-05 MED ORDER — MIDAZOLAM HCL 5 MG/5ML IJ SOLN
INTRAMUSCULAR | Status: DC | PRN
  Administered 2015-10-05 (×2): 1 mg via INTRAVENOUS

## 2015-10-05 MED ORDER — DEXAMETHASONE SODIUM PHOSPHATE 4 MG/ML IJ SOLN
INTRAMUSCULAR | Status: DC | PRN
  Administered 2015-10-05: 4 mg via INTRAVENOUS

## 2015-10-05 MED ORDER — ONDANSETRON HCL 4 MG/2ML IJ SOLN
INTRAMUSCULAR | Status: DC | PRN
  Administered 2015-10-05: 4 mg via INTRAVENOUS

## 2015-10-05 SURGICAL SUPPLY — 49 items
ADH SKN CLS APL DERMABOND .7 (GAUZE/BANDAGES/DRESSINGS) ×1
APPLIER CLIP ROT 10 11.4 M/L (STAPLE)
APR CLP MED LRG 11.4X10 (STAPLE)
BAG SPEC RTRVL LRG 6X4 10 (ENDOMECHANICALS) ×1
BLADE SURG ROTATE 9660 (MISCELLANEOUS) ×1 IMPLANT
CANISTER SUCTION 2500CC (MISCELLANEOUS) ×2 IMPLANT
CHLORAPREP W/TINT 26ML (MISCELLANEOUS) ×2 IMPLANT
CLIP APPLIE ROT 10 11.4 M/L (STAPLE) IMPLANT
COVER SURGICAL LIGHT HANDLE (MISCELLANEOUS) ×2 IMPLANT
CUTTER LINEAR ENDO 35 ETS (STAPLE) ×1 IMPLANT
DERMABOND ADVANCED (GAUZE/BANDAGES/DRESSINGS) ×1
DERMABOND ADVANCED .7 DNX12 (GAUZE/BANDAGES/DRESSINGS) ×1 IMPLANT
DRSG TEGADERM 2-3/8X2-3/4 SM (GAUZE/BANDAGES/DRESSINGS) ×6 IMPLANT
ELECT REM PT RETURN 9FT ADLT (ELECTROSURGICAL) ×2
ELECTRODE REM PT RTRN 9FT ADLT (ELECTROSURGICAL) ×1 IMPLANT
ENDOLOOP SUT PDS II  0 18 (SUTURE)
ENDOLOOP SUT PDS II 0 18 (SUTURE) IMPLANT
GLOVE BIO SURGEON STRL SZ7 (GLOVE) ×1 IMPLANT
GLOVE BIOGEL PI IND STRL 6 (GLOVE) IMPLANT
GLOVE BIOGEL PI IND STRL 7.0 (GLOVE) IMPLANT
GLOVE BIOGEL PI IND STRL 8 (GLOVE) ×1 IMPLANT
GLOVE BIOGEL PI INDICATOR 6 (GLOVE) ×1
GLOVE BIOGEL PI INDICATOR 7.0 (GLOVE) ×2
GLOVE BIOGEL PI INDICATOR 8 (GLOVE) ×1
GLOVE ECLIPSE 7.5 STRL STRAW (GLOVE) ×2 IMPLANT
GLOVE SURG SS PI 6.5 STRL IVOR (GLOVE) ×1 IMPLANT
GOWN STRL REUS W/ TWL LRG LVL3 (GOWN DISPOSABLE) ×3 IMPLANT
GOWN STRL REUS W/TWL LRG LVL3 (GOWN DISPOSABLE) ×6
KIT BASIN OR (CUSTOM PROCEDURE TRAY) ×2 IMPLANT
KIT ROOM TURNOVER OR (KITS) ×2 IMPLANT
NS IRRIG 1000ML POUR BTL (IV SOLUTION) ×2 IMPLANT
PAD ARMBOARD 7.5X6 YLW CONV (MISCELLANEOUS) ×4 IMPLANT
PENCIL BUTTON HOLSTER BLD 10FT (ELECTRODE) IMPLANT
POUCH SPECIMEN RETRIEVAL 10MM (ENDOMECHANICALS) ×2 IMPLANT
RELOAD /EVU35 (ENDOMECHANICALS) IMPLANT
RELOAD CUTTER ETS 35MM STAND (ENDOMECHANICALS) ×1 IMPLANT
SCALPEL HARMONIC ACE (MISCELLANEOUS) ×2 IMPLANT
SET IRRIG TUBING LAPAROSCOPIC (IRRIGATION / IRRIGATOR) ×2 IMPLANT
SLEEVE ENDOPATH XCEL 5M (ENDOMECHANICALS) ×2 IMPLANT
SPECIMEN JAR SMALL (MISCELLANEOUS) ×2 IMPLANT
STRIP CLOSURE SKIN 1/2X4 (GAUZE/BANDAGES/DRESSINGS) ×2 IMPLANT
SUT MNCRL AB 4-0 PS2 18 (SUTURE) ×2 IMPLANT
TOWEL OR 17X24 6PK STRL BLUE (TOWEL DISPOSABLE) ×2 IMPLANT
TOWEL OR 17X26 10 PK STRL BLUE (TOWEL DISPOSABLE) ×1 IMPLANT
TRAY FOLEY CATH 16FR SILVER (SET/KITS/TRAYS/PACK) ×1 IMPLANT
TRAY LAPAROSCOPIC MC (CUSTOM PROCEDURE TRAY) ×2 IMPLANT
TROCAR XCEL BLUNT TIP 100MML (ENDOMECHANICALS) ×2 IMPLANT
TROCAR XCEL NON-BLD 5MMX100MML (ENDOMECHANICALS) ×2 IMPLANT
TUBING INSUFFLATION (TUBING) ×2 IMPLANT

## 2015-10-05 NOTE — Transfer of Care (Signed)
Immediate Anesthesia Transfer of Care Note  Patient: Aaron Bradshaw  Procedure(s) Performed: Procedure(s): APPENDECTOMY LAPAROSCOPIC (N/A)  Patient Location: PACU  Anesthesia Type:General  Level of Consciousness: awake, alert  and oriented  Airway & Oxygen Therapy: Patient Spontanous Breathing  Post-op Assessment: Report given to RN and Post -op Vital signs reviewed and stable  Post vital signs: Reviewed and stable  Last Vitals:  Filed Vitals:   10/05/15 0623  BP: 148/73  Pulse: 49  Temp: 36.8 C  Resp: 18    Complications: No apparent anesthesia complications

## 2015-10-05 NOTE — Anesthesia Postprocedure Evaluation (Signed)
Anesthesia Post Note  Patient: [place]shaw  Procedure(s) Performed: Procedure(s) (LRB): APPENDECTOMY LAPAROSCOPIC (N/A)  Patient location during evaluation: PACU Anesthesia Type: General Level of consciousness: awake and alert Pain management: pain level controlled Vital Signs Assessment: post-procedure vital signs reviewed and stable Respiratory status: spontaneous breathing, nonlabored ventilation, respiratory function stable and patient connected to nasal cannula oxygen Cardiovascular status: blood pressure returned to baseline and stable Postop Assessment: no signs of nausea or vomiting Anesthetic complications: no    Last Vitals:  Filed Vitals:   10/05/15 1041 10/05/15 1050  BP:  145/79  Pulse:    Temp: 36.7 C   Resp:      Last Pain:  Filed Vitals:   10/05/15 1106  PainSc: 0-No pain                 Montez Hageman

## 2015-10-05 NOTE — Anesthesia Procedure Notes (Signed)
Procedure Name: Intubation Date/Time: 10/05/2015 7:38 AM Performed by: Suzy Bouchard Pre-anesthesia Checklist: Patient identified, Emergency Drugs available, Suction available, Timeout performed and Patient being monitored Patient Re-evaluated:Patient Re-evaluated prior to inductionOxygen Delivery Method: Circle system utilized Preoxygenation: Pre-oxygenation with 100% oxygen Intubation Type: IV induction Ventilation: Mask ventilation without difficulty Laryngoscope Size: Miller and 2 Grade View: Grade I Tube type: Oral Laser Tube: Cuffed inflated with minimal occlusive pressure - saline Tube size: 7.5 mm Number of attempts: 1 Airway Equipment and Method: Stylet and LTA kit utilized Placement Confirmation: ETT inserted through vocal cords under direct vision,  positive ETCO2 and breath sounds checked- equal and bilateral Secured at: 21 cm Tube secured with: Tape Dental Injury: Teeth and Oropharynx as per pre-operative assessment

## 2015-10-05 NOTE — Op Note (Signed)
OPERATIVE REPORT  DATE OF OPERATION: 10/05/2015  PATIENT:  Aaron Bradshaw  65 y.o. male  PRE-OPERATIVE DIAGNOSIS:  Appendiceal orifice polyp  POST-OPERATIVE DIAGNOSIS:  Appendiceal orifice polyp  PROCEDURE:  Procedure(s): APPENDECTOMY LAPAROSCOPIC  SURGEON:  Surgeon(s): Judeth Horn, MD  ASSISTANT: None  ANESTHESIA:   general  EBL: <20 ml  BLOOD ADMINISTERED: none  DRAINS: none   SPECIMEN:  Source of Specimen:  Appendix and base of the cecum  COUNTS CORRECT:  YES  PROCEDURE DETAILS: The patient was taken to the operating room and placed on table in the supine position. After an adequate general endotracheal anesthetic was administered he was prepped and draped in the usual sterile manner exposing the abdomen from the xiphoid superiorly to the pubis inferiorly and to the bilateral deadlines.  A proper timeout was performed identifying the patient and procedure to be performed. An infraumbilical midline incision approximately 2 cm long was made using a #15 blade and taken down to the midline fascia. We incised the fascia with the 15 blade the graft the edges with Coker clamps. We bluntly dissect into the peritoneal cavity then subsequently passed a pursestring suture of 0 Vicryl around the fascial opening. This secured in place a Hassan cannula which was used to insufflate carbon dioxide gas up to a maximal intra-abdominal pressure of 15 mmHg.  The patient was placed in Trendelenburg position left side was tilted down. A 5 mm cannula and a left low quadrant 5 mm cannula pass under direct vision to the perineal cavity. Once all cannulas were in place the dissection was begun.  The appendix was tethered by adhesions to the right paracolic area but it was not truly retrocecal. We detached these adhesions using a Harmonic Scalpel and subsequently came across the mesial appendix using the harmonic scalpel. We skeletonized the mesentery to the base of the appendix at the cecum. The  plan was to take the 01-2 cm of cecum at the base of the appendix with the stapling device. A blue cartridge Endo GIA Ulice Dash used to come across the base of the cecum with minimal difficulty. Once specimen was off the all and once her closely case we opened the appendix and saw that the previously biopsied polyp was at the base.  Once we removed that surgery inspected the area of the resection using the laparoscope and irrigation found to be minimal to no bleeding. We aspirated all fluid and gas from the perineal cavity. These infraumbilical fascial site was closed using the pursestring suture which was in place. We aspirated all fluid and gas and closed.  The skin at all sites were injected with 0.25% Marcaine. We closed the infraumbilical skin using a running subcuticular stitch of 4-0 Monocryl. Dermabond Steri-Strips and Tegaderms are used to complete our dressings. All needle counts, sponge counts, and instrument counts were correct.  PATIENT DISPOSITION:  PACU - hemodynamically stable.   Carrol Hougland 3/13/20178:48 AM

## 2015-10-05 NOTE — Discharge Instructions (Signed)
Laparoscopic Appendectomy, Adult, Care After °Refer to this sheet in the next few weeks. These instructions provide you with information on caring for yourself after your procedure. Your caregiver may also give you more specific instructions. Your treatment has been planned according to current medical practices, but problems sometimes occur. Call your caregiver if you have any problems or questions after your procedure. °HOME CARE INSTRUCTIONS °· Do not drive while taking narcotic pain medicines. °· Use stool softener if you become constipated from your pain medicines. °· Change your bandages (dressings) as directed. °· Keep your wounds clean and dry. You may wash the wounds gently with soap and water. Gently pat the wounds dry with a clean towel. °· Do not take baths, swim, or use hot tubs for 10 days, or as instructed by your caregiver. °· Only take over-the-counter or prescription medicines for pain, discomfort, or fever as directed by your caregiver. °· You may continue your normal diet as directed. °· Do not lift more than 10 pounds (4.5 kg) or play contact sports for 3 weeks, or as directed. °· Slowly increase your activity after surgery. °· Take deep breaths to avoid getting a lung infection (pneumonia). °SEEK MEDICAL CARE IF: °· You have redness, swelling, or increasing pain in your wounds. °· You have pus coming from your wounds. °· You have drainage from a wound that lasts longer than 1 day. °· You notice a bad smell coming from the wounds or dressing. °· Your wound edges break open after stitches (sutures) have been removed. °· You notice increasing pain in the shoulders (shoulder strap areas) or near your shoulder blades. °· You develop dizzy episodes or fainting while standing. °· You develop shortness of breath. °· You develop persistent nausea or vomiting. °· You cannot control your bowel functions or lose your appetite. °· You develop diarrhea. °SEEK IMMEDIATE MEDICAL CARE IF:  °· You have a  fever. °· You develop a rash. °· You have difficulty breathing or sharp pains in your chest. °· You develop any reaction or side effects to medicines given. °MAKE SURE YOU: °· Understand these instructions. °· Will watch your condition. °· Will get help right away if you are not doing well or get worse. °  °This information is not intended to replace advice given to you by your health care provider. Make sure you discuss any questions you have with your health care provider. °  °Document Released: 07/11/2005 Document Revised: 11/25/2014 Document Reviewed: 12/29/2014 °Elsevier Interactive Patient Education ©2016 [employer] ° °

## 2015-10-05 NOTE — Anesthesia Preprocedure Evaluation (Addendum)
Anesthesia Evaluation  Patient identified by MRN, date of birth, ID band Patient awake    Reviewed: Allergy & Precautions, NPO status , Patient's Chart, lab work & pertinent test results  Airway Mallampati: II  TM Distance: >3 FB Neck ROM: Full    Dental no notable dental hx. (+) Teeth Intact, Dental Advisory Given   Pulmonary neg pulmonary ROS,    Pulmonary exam normal breath sounds clear to auscultation       Cardiovascular negative cardio ROS Normal cardiovascular exam Rhythm:Regular Rate:Normal     Neuro/Psych negative neurological ROS  negative psych ROS   GI/Hepatic negative GI ROS, Neg liver ROS,   Endo/Other  diabetes, Type 2, Oral Hypoglycemic Agents  Renal/GU negative Renal ROS  negative genitourinary   Musculoskeletal negative musculoskeletal ROS (+)   Abdominal   Peds negative pediatric ROS (+)  Hematology negative hematology ROS (+)   Anesthesia Other Findings   Reproductive/Obstetrics negative OB ROS                           Anesthesia Physical Anesthesia Plan  ASA: II  Anesthesia Plan: General   Post-op Pain Management:    Induction: Intravenous  Airway Management Planned: Oral ETT  Additional Equipment:   Intra-op Plan:   Post-operative Plan: Extubation in OR  Informed Consent: I have reviewed the patients History and Physical, chart, labs and discussed the procedure including the risks, benefits and alternatives for the proposed anesthesia with the patient or authorized representative who has indicated his/her understanding and acceptance.   Dental advisory given  Plan Discussed with: CRNA  Anesthesia Plan Comments:         Anesthesia Quick Evaluation

## 2015-10-06 ENCOUNTER — Encounter (HOSPITAL_COMMUNITY): Payer: Self-pay | Admitting: General Surgery

## 2015-10-09 ENCOUNTER — Other Ambulatory Visit: Payer: 59

## 2015-10-12 ENCOUNTER — Other Ambulatory Visit: Payer: 59

## 2015-10-13 ENCOUNTER — Other Ambulatory Visit: Payer: 59

## 2015-10-14 ENCOUNTER — Other Ambulatory Visit (INDEPENDENT_AMBULATORY_CARE_PROVIDER_SITE_OTHER): Payer: 59

## 2015-10-14 DIAGNOSIS — Z0000 Encounter for general adult medical examination without abnormal findings: Secondary | ICD-10-CM | POA: Diagnosis not present

## 2015-10-14 LAB — BASIC METABOLIC PANEL
BUN: 16 mg/dL (ref 6–23)
CHLORIDE: 100 meq/L (ref 96–112)
CO2: 31 meq/L (ref 19–32)
Calcium: 9 mg/dL (ref 8.4–10.5)
Creatinine, Ser: 0.9 mg/dL (ref 0.40–1.50)
GFR: 90.31 mL/min (ref >60.00)
Glucose, Bld: 181 mg/dL — ABNORMAL HIGH (ref 70–99)
POTASSIUM: 4.2 meq/L (ref 3.5–5.1)
Sodium: 137 mEq/L (ref 135–145)

## 2015-10-14 LAB — CBC WITH DIFFERENTIAL/PLATELET
BASOS PCT: 0.7 % (ref 0.0–3.0)
Basophils Absolute: 0 10*3/uL (ref 0.0–0.1)
EOS ABS: 0.1 10*3/uL (ref 0.0–0.7)
EOS PCT: 2 % (ref 0.0–5.0)
HEMATOCRIT: 44.8 % (ref 39.0–52.0)
HEMOGLOBIN: 15.4 g/dL (ref 13.0–17.0)
LYMPHS PCT: 31.5 % (ref 12.0–46.0)
Lymphs Abs: 1.7 10*3/uL (ref 0.7–4.0)
MCHC: 34.3 g/dL (ref 30.0–36.0)
MCV: 88.8 fl (ref 78.0–100.0)
MONOS PCT: 8.2 % (ref 3.0–12.0)
Monocytes Absolute: 0.4 10*3/uL (ref 0.1–1.0)
Neutro Abs: 3 10*3/uL (ref 1.4–7.7)
Neutrophils Relative %: 57.6 % (ref 43.0–77.0)
Platelets: 188 10*3/uL (ref 150.0–400.0)
RBC: 5.05 Mil/uL (ref 4.22–5.81)
RDW: 13.1 % (ref 11.5–15.5)
WBC: 5.3 10*3/uL (ref 4.0–10.5)

## 2015-10-14 LAB — HEPATIC FUNCTION PANEL
ALK PHOS: 71 U/L (ref 39–117)
ALT: 20 U/L (ref 0–53)
AST: 18 U/L (ref 0–37)
Albumin: 4 g/dL (ref 3.5–5.2)
BILIRUBIN TOTAL: 1.2 mg/dL (ref 0.2–1.2)
Bilirubin, Direct: 0.2 mg/dL (ref 0.0–0.3)
Total Protein: 6.6 g/dL (ref 6.0–8.3)

## 2015-10-14 LAB — LIPID PANEL
CHOLESTEROL: 176 mg/dL (ref 0–200)
HDL: 30.4 mg/dL — AB (ref >39.00)
LDL CALC: 106 mg/dL — AB (ref 0–99)
NONHDL: 145.45
Total CHOL/HDL Ratio: 6
Triglycerides: 198 mg/dL — ABNORMAL HIGH (ref 0.0–149.0)
VLDL: 39.6 mg/dL (ref 0.0–40.0)

## 2015-10-14 LAB — PSA: PSA: 1.68 ng/mL (ref 0.10–4.00)

## 2015-10-14 LAB — TSH: TSH: 3.68 u[IU]/mL (ref 0.35–4.50)

## 2015-10-16 ENCOUNTER — Encounter: Payer: Self-pay | Admitting: Family Medicine

## 2015-10-16 ENCOUNTER — Ambulatory Visit (INDEPENDENT_AMBULATORY_CARE_PROVIDER_SITE_OTHER): Payer: 59 | Admitting: Family Medicine

## 2015-10-16 VITALS — BP 120/80 | HR 63 | Temp 97.8°F | Ht 73.75 in | Wt 230.2 lb

## 2015-10-16 DIAGNOSIS — Z0000 Encounter for general adult medical examination without abnormal findings: Secondary | ICD-10-CM

## 2015-10-16 NOTE — Progress Notes (Signed)
Pre visit review using our clinic review tool, if applicable. No additional management support is needed unless otherwise documented below in the visit note. 

## 2015-10-16 NOTE — Progress Notes (Signed)
Subjective:    Patient ID: Aaron Bradshaw, male    DOB: 1951/03/14, 65 y.o.   MRN: [id number]  HPI  Here for CPE.  Recent colonoscopy.  Had appendiceal orifice polyp and had surgery last week for appendix and polyp removal.   Has done well since then.  Immunizations up to date.  Exercises regularly.  Nonsmoker. Type 2 diabetes fairly well controlled.  Past Medical History  Diagnosis Date  . History of chest pain     Hospitalized ER in [place] for CP and Palpitations with Neg Enzymes  . Gilbert syndrome   . Testosterone deficiency     Dr Hartley Barefoot  . Hyperlipidemia   . 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 Shingles     2015  . Heart murmur     'when I was younger"  . Diabetes mellitus ([place]) 6/12    A1c 7.1 %   dx 4 yrs ago  . Anxiety   . 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 Knee arthroscopy      Left  . Shoulder arthroscopy    . Nose surgery      Dr  Truman Hayward  . Colonoscopy      Tics, Polyp 12/26/2003; polyps 2010, Dr Carlean Purl  . Cholecystectomy  2010  . Rotator cuff repair      left  . Laparoscopic appendectomy N/A 10/05/2015    Procedure: APPENDECTOMY LAPAROSCOPIC;  Surgeon: Judeth Horn, MD;  Location: [facility];  Service: General;  Laterality: N/A;    reports that he has never smoked. He has never used smokeless tobacco. He reports that he drinks about 0.6 - 1.2 oz of alcohol per week. He reports that he does not use illicit drugs. family history includes Arthritis in his mother; Depression in his father, mother, and sister; Diabetes in his maternal grandfather and mother; Heart disease in his father; OCD in his father; Parkinsonism in his mother; Prostate cancer in his father and paternal grandfather; Stroke (age of onset: 53) in his mother. There is no history of Colon cancer. Allergies  Allergen Reactions  . Codeine Nausea Only  . Prozac [Fluoxetine Hcl] Anxiety    Increased anxiety     Review of Systems    Constitutional: Negative for fever, activity change, appetite change and fatigue.  HENT: Negative for congestion, ear pain and trouble swallowing.   Eyes: Negative for pain and visual disturbance.  Respiratory: Negative for cough, shortness of breath and wheezing.   Cardiovascular: Negative for chest pain and palpitations.  Gastrointestinal: Negative for nausea, vomiting, abdominal pain, diarrhea, constipation, blood in stool, abdominal distention and rectal pain.  Endocrine: Negative for polydipsia and polyuria.  Genitourinary: Negative for dysuria, hematuria and testicular pain.  Musculoskeletal: Negative for joint swelling and arthralgias.  Skin: Negative for rash.  Neurological: Negative for dizziness, syncope and headaches.  Hematological: Negative for adenopathy.  Psychiatric/Behavioral: Negative for confusion and dysphoric mood.       Objective:   Physical Exam  Constitutional: He is oriented to person, place, and time. He appears well-developed and well-nourished.  HENT:  Right Ear: External ear normal.  Left Ear: External ear normal.  Mouth/Throat: Oropharynx is clear and moist.  Eyes: Pupils are equal, round, and reactive to light.  Neck: Neck supple. No thyromegaly present.  Cardiovascular: Normal rate and regular rhythm.  Exam reveals no gallop.   Pulmonary/Chest: Effort normal and breath sounds normal. No respiratory  distress. He has no wheezes. He has no rales.  Abdominal: Soft. He exhibits no mass. There is no tenderness. There is no guarding.  Incision sites from recent surgery healing well.  Musculoskeletal: He exhibits no edema.  Neurological: He is alert and oriented to person, place, and time. No cranial nerve deficit.  Skin: No rash noted.  Psychiatric: He has a normal mood and affect. His behavior is normal.          Assessment & Plan:  Physical exam.  Immunizations up to date.  Recent colonoscopy as above. Labs reviewed.  Mildly elevated lipids.   Discussed statins and he declines. Repeat A1C in about 2 months.

## 2016-01-18 ENCOUNTER — Ambulatory Visit (INDEPENDENT_AMBULATORY_CARE_PROVIDER_SITE_OTHER): Payer: 59 | Admitting: Family Medicine

## 2016-01-18 VITALS — BP 110/90 | HR 57 | Temp 97.5°F | Ht 73.75 in | Wt 233.0 lb

## 2016-01-18 DIAGNOSIS — R002 Palpitations: Secondary | ICD-10-CM

## 2016-01-18 DIAGNOSIS — E119 Type 2 diabetes mellitus without complications: Secondary | ICD-10-CM

## 2016-01-18 LAB — POCT GLYCOSYLATED HEMOGLOBIN (HGB A1C): Hemoglobin A1C: 7.3

## 2016-01-18 NOTE — Progress Notes (Signed)
Pre visit review using our clinic review tool, if applicable. No additional management support is needed unless otherwise documented below in the visit note. 

## 2016-01-18 NOTE — Progress Notes (Signed)
Subjective:    Patient ID: Aaron Bradshaw, male    DOB: 1951-02-14, 65 y.o.   MRN: [id number]  HPI  Type 2 diabetes Last A1c 7.3%. Takes metformin extended release 500 mg twice daily. Fasting blood sugars been up somewhat around 140-160 Exercises regularly. Has been fairly diligent as far as diet goes  Palpitations. Intermittent for years. More frequent past couple months. Occur more frequent at night. No clear triggers. No alleviating factors. Minimal caffeine use. He is aware of irregular heartbeat but no tachycardia or bradycardia. No associated chest pain, dizziness, or syncope. He thinks he might have had some type of event monitor but this was many years ago. Recent EKG from March reviewed which showed no acute abnormalities Recent TSH normal. Never had any exertional chest pains.  Past Medical History  Diagnosis Date  . History of chest pain     Hospitalized ER in [place] for CP and Palpitations with Neg Enzymes  . Gilbert syndrome   . Testosterone deficiency     Dr Hartley Barefoot  . Hyperlipidemia   . 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 Shingles     2015  . Heart murmur     'when I was younger"  . Diabetes mellitus ([place]) 6/12    A1c 7.1 %   dx 4 yrs ago  . Anxiety   . 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 Knee arthroscopy      Left  . Shoulder arthroscopy    . Nose surgery      Dr  Truman Hayward  . Colonoscopy      Tics, Polyp 12/26/2003; polyps 2010, Dr Carlean Purl  . Cholecystectomy  2010  . Rotator cuff repair      left  . Laparoscopic appendectomy N/A 10/05/2015    Procedure: APPENDECTOMY LAPAROSCOPIC;  Surgeon: Judeth Horn, MD;  Location: [facility];  Service: General;  Laterality: N/A;    reports that he has never smoked. He has never used smokeless tobacco. He reports that he drinks about 0.6 - 1.2 oz of alcohol per week. He reports that he does not use illicit drugs. family history includes Arthritis in his  mother; Depression in his father, mother, and sister; Diabetes in his maternal grandfather and mother; Heart disease in his father; OCD in his father; Parkinsonism in his mother; Prostate cancer in his father and paternal grandfather; Stroke (age of onset: 62) in his mother. There is no history of Colon cancer. Allergies  Allergen Reactions  . Codeine Nausea Only  . Prozac [Fluoxetine Hcl] Anxiety    Increased anxiety      Review of Systems  Constitutional: Negative for fatigue and unexpected weight change.  Eyes: Negative for visual disturbance.  Respiratory: Negative for cough, chest tightness and shortness of breath.   Cardiovascular: Positive for palpitations. Negative for chest pain and leg swelling.  Endocrine: Negative for polydipsia and polyuria.  Neurological: Negative for dizziness, syncope, weakness, light-headedness and headaches.       Objective:   Physical Exam  Constitutional: He is oriented to person, place, and time. He appears well-developed and well-nourished.  HENT:  Right Ear: External ear normal.  Left Ear: External ear normal.  Mouth/Throat: Oropharynx is clear and moist.  Eyes: Pupils are equal, round, and reactive to light.  Neck: Neck supple. No thyromegaly present.  Cardiovascular: Normal rate and regular rhythm.   Pulmonary/Chest: Effort normal and breath sounds normal. No  respiratory distress. He has no wheezes. He has no rales.  Musculoskeletal: He exhibits no edema.  Neurological: He is alert and oriented to person, place, and time.          Assessment & Plan:  #1 type 2 diabetes. Recent suboptimal control by home readings. Recheck A1c. Consider further titration of metformin extended release. We discussed other classes of medication as well A1C 7.3% .  He would like to try 3 more months of lifestyle management. Repeat A1C in 3 months.    #2 palpitations. Likely PVCs or PACs. Patient is concerned about frequency. He does not have any  worrisome associated symptoms. Consider event monitor  Eulas Post MD [facility] at [facility]

## 2016-01-19 ENCOUNTER — Ambulatory Visit (INDEPENDENT_AMBULATORY_CARE_PROVIDER_SITE_OTHER): Payer: 59

## 2016-01-19 DIAGNOSIS — R002 Palpitations: Secondary | ICD-10-CM

## 2016-04-06 ENCOUNTER — Other Ambulatory Visit: Payer: Self-pay | Admitting: Family Medicine

## 2016-04-06 NOTE — Telephone Encounter (Signed)
Rx refill sent to pharmacy. 

## 2016-04-18 ENCOUNTER — Ambulatory Visit (INDEPENDENT_AMBULATORY_CARE_PROVIDER_SITE_OTHER): Payer: 59 | Admitting: Family Medicine

## 2016-04-18 ENCOUNTER — Encounter: Payer: Self-pay | Admitting: Family Medicine

## 2016-04-18 VITALS — BP 110/80 | HR 60 | Temp 97.9°F | Ht 73.75 in | Wt 230.5 lb

## 2016-04-18 DIAGNOSIS — Z23 Encounter for immunization: Secondary | ICD-10-CM

## 2016-04-18 DIAGNOSIS — E119 Type 2 diabetes mellitus without complications: Secondary | ICD-10-CM

## 2016-04-18 LAB — POCT GLYCOSYLATED HEMOGLOBIN (HGB A1C): Hemoglobin A1C: 7.6

## 2016-04-18 MED ORDER — METFORMIN HCL ER 750 MG PO TB24: 750.0000 mg | ORAL_TABLET | Freq: Two times a day (BID) | ORAL | 3 refills | Status: DC

## 2016-04-18 NOTE — Progress Notes (Signed)
Pre visit review using our clinic review tool, if applicable. No additional management support is needed unless otherwise documented below in the visit note. 

## 2016-04-18 NOTE — Progress Notes (Signed)
Subjective:     Patient ID: Aaron Bradshaw, male   DOB: [birth year], 65 y.o.   MRN: [id number]  HPI Patient seen for follow-up type 2 diabetes. He's had marginal control with last A1c 7.3%. Fasting blood sugars still been slightly elevated. He continues to exercise few days per week. He is getting regular eye exams.  Still needs flu vaccine  Past Medical History:  Diagnosis Date  . Anxiety   . 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 Diabetes mellitus ([place]) 6/12   A1c 7.1 %   dx 4 yrs ago  . Gilbert syndrome   . Heart murmur    'when I was younger"  . History of chest pain    Hospitalized ER in [place] for CP and Palpitations with Neg Enzymes  . Hyperlipidemia   . Shingles    2015  . Testosterone deficiency    Dr Hartley Barefoot  . 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 CHOLECYSTECTOMY  2010  . COLONOSCOPY     Tics, Polyp 12/26/2003; polyps 2010, Dr Carlean Purl  . KNEE ARTHROSCOPY     Left  . LAPAROSCOPIC APPENDECTOMY N/A 10/05/2015   Procedure: APPENDECTOMY LAPAROSCOPIC;  Surgeon: Judeth Horn, MD;  Location: [facility];  Service: General;  Laterality: N/A;  . NOSE SURGERY     Dr  Truman Hayward  . ROTATOR CUFF REPAIR     left  . SHOULDER ARTHROSCOPY      reports that he has never smoked. He has never used smokeless tobacco. He reports that he drinks about 0.6 - 1.2 oz of alcohol per week . He reports that he does not use drugs. family history includes Arthritis in his mother; Depression in his father, mother, and sister; Diabetes in his maternal grandfather and mother; Heart disease in his father; OCD in his father; Parkinsonism in his mother; Prostate cancer in his father and paternal grandfather; Stroke (age of onset: 26) in his mother. Allergies  Allergen Reactions  . Codeine Nausea Only  . Prozac [Fluoxetine Hcl] Anxiety    Increased anxiety     Review of Systems  Constitutional: Negative for fatigue.  Eyes: Negative for visual  disturbance.  Respiratory: Negative for cough, chest tightness and shortness of breath.   Cardiovascular: Negative for chest pain, palpitations and leg swelling.  Endocrine: Negative for polydipsia and polyuria.  Neurological: Negative for dizziness, syncope, weakness, light-headedness and headaches.       Objective:   Physical Exam  Constitutional: He appears well-developed and well-nourished.  Neck: Neck supple.  Cardiovascular: Normal rate and regular rhythm.   Pulmonary/Chest: Effort normal and breath sounds normal. No respiratory distress. He has no wheezes. He has no rales.  Musculoskeletal: He exhibits no edema.       Assessment:     Type 2 diabetes. History of marginal control    Plan:     -Flu vaccine given -Recheck A1c -Consider increasing dosage of metformin is still elevated  A1C 7.6% Increase metformin extended release to 750 mg twice daily and reassess in 3 months  Eulas Post MD [facility] at [facility]

## 2016-07-12 ENCOUNTER — Ambulatory Visit (INDEPENDENT_AMBULATORY_CARE_PROVIDER_SITE_OTHER): Payer: 59 | Admitting: Family Medicine

## 2016-07-12 ENCOUNTER — Encounter: Payer: Self-pay | Admitting: Family Medicine

## 2016-07-12 VITALS — BP 108/80 | HR 57 | Temp 97.8°F | Ht 73.75 in | Wt 226.0 lb

## 2016-07-12 DIAGNOSIS — E119 Type 2 diabetes mellitus without complications: Secondary | ICD-10-CM

## 2016-07-12 LAB — POCT GLYCOSYLATED HEMOGLOBIN (HGB A1C): Hemoglobin A1C: 6.6

## 2016-07-12 NOTE — Progress Notes (Signed)
Pre visit review using our clinic review tool, if applicable. No additional management support is needed unless otherwise documented below in the visit note. 

## 2016-07-12 NOTE — Progress Notes (Signed)
Subjective:     Patient ID: Aaron Bradshaw, male   DOB: Nov 29, 1950, 65 y.o.   MRN: [id number]  HPI Patient here for follow-up type 2 diabetes. Last A1c 7.3%. We increased his metformin extended release 750 mg to twice daily. He has not had any diarrhea or abdominal cramps or other side effects. He has lost 4 pounds. He has increased his exercise. Feels well overall. No hypoglycemic symptoms. No polyuria or polydipsia. Denies any recent chest pains or dizziness.  Past Medical History:  Diagnosis Date  . Anxiety   . 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 Diabetes mellitus ([place]) 6/12   A1c 7.1 %   dx 4 yrs ago  . Gilbert syndrome   . Heart murmur    'when I was younger"  . History of chest pain    Hospitalized ER in [place] for CP and Palpitations with Neg Enzymes  . Hyperlipidemia   . Shingles    2015  . Testosterone deficiency    Dr Hartley Barefoot  . 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 CHOLECYSTECTOMY  2010  . COLONOSCOPY     Tics, Polyp 12/26/2003; polyps 2010, Dr Carlean Purl  . KNEE ARTHROSCOPY     Left  . LAPAROSCOPIC APPENDECTOMY N/A 10/05/2015   Procedure: APPENDECTOMY LAPAROSCOPIC;  Surgeon: Judeth Horn, MD;  Location: [facility];  Service: General;  Laterality: N/A;  . NOSE SURGERY     Dr  Truman Hayward  . ROTATOR CUFF REPAIR     left  . SHOULDER ARTHROSCOPY      reports that he has never smoked. He has never used smokeless tobacco. He reports that he drinks about 0.6 - 1.2 oz of alcohol per week . He reports that he does not use drugs. family history includes Arthritis in his mother; Depression in his father, mother, and sister; Diabetes in his maternal grandfather and mother; Heart disease in his father; OCD in his father; Parkinsonism in his mother; Prostate cancer in his father and paternal grandfather; Stroke (age of onset: 64) in his mother. Allergies  Allergen Reactions  . Codeine Nausea Only  . Prozac [Fluoxetine Hcl] Anxiety     Increased anxiety     Review of Systems  Constitutional: Negative for fatigue.  Eyes: Negative for visual disturbance.  Respiratory: Negative for cough, chest tightness and shortness of breath.   Cardiovascular: Negative for chest pain, palpitations and leg swelling.  Endocrine: Negative for polydipsia and polyuria.  Neurological: Negative for dizziness, syncope, weakness, light-headedness and headaches.       Objective:   Physical Exam  Constitutional: He is oriented to person, place, and time. He appears well-developed and well-nourished.  HENT:  Right Ear: External ear normal.  Left Ear: External ear normal.  Mouth/Throat: Oropharynx is clear and moist.  Eyes: Pupils are equal, round, and reactive to light.  Neck: Neck supple. No thyromegaly present.  Cardiovascular: Normal rate and regular rhythm.   Pulmonary/Chest: Effort normal and breath sounds normal. No respiratory distress. He has no wheezes. He has no rales.  Musculoskeletal: He exhibits no edema.  Neurological: He is alert and oriented to person, place, and time.  Skin:  Feet reveal no skin lesions. Good distal foot pulses. Good capillary refill. No calluses. Normal sensation with monofilament testing        Assessment:     Type 2 diabetes.  Improved with hemoglobin A1c today 6.6%    Plan:     =  Continue regular exercise habits. Continue weight loss efforts. = Reassess in about 3 months and obtain lipid panel that point  Eulas Post MD [facility]

## 2016-07-15 ENCOUNTER — Ambulatory Visit: Payer: 59 | Admitting: Family Medicine

## 2016-10-10 ENCOUNTER — Ambulatory Visit (INDEPENDENT_AMBULATORY_CARE_PROVIDER_SITE_OTHER): Payer: 59 | Admitting: Family Medicine

## 2016-10-10 VITALS — BP 120/80 | HR 56 | Temp 97.4°F | Wt 229.8 lb

## 2016-10-10 DIAGNOSIS — E119 Type 2 diabetes mellitus without complications: Secondary | ICD-10-CM | POA: Diagnosis not present

## 2016-10-10 LAB — POCT GLYCOSYLATED HEMOGLOBIN (HGB A1C): HEMOGLOBIN A1C: 7.1

## 2016-10-10 NOTE — Patient Instructions (Signed)
Try to lose a few pounds and let's plan to repeat A1C in about 3 months

## 2016-10-10 NOTE — Progress Notes (Signed)
Subjective:     Patient ID: Aaron Bradshaw, male   DOB: 27-May-1951, 66 y.o.   MRN: [id number]  HPI Patient seen for follow-up regarding type 2 diabetes. Last A1c 6.6%. He exercises most days per week. Not monitoring blood sugars closely. Poor compliance with diet at times. Takes metformin extended release and no other diabetes medications. No polyuria or polydipsia. Blood pressures been very stable. Needs to set up follow-up exam.  Past Medical History:  Diagnosis Date  . Anxiety   . 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 Diabetes mellitus ([place]) 6/12   A1c 7.1 %   dx 4 yrs ago  . Gilbert syndrome   . Heart murmur    'when I was younger"  . History of chest pain    Hospitalized ER in [place] for CP and Palpitations with Neg Enzymes  . Hyperlipidemia   . Shingles    2015  . Testosterone deficiency    Dr Hartley Barefoot  . 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 CHOLECYSTECTOMY  2010  . COLONOSCOPY     Tics, Polyp 12/26/2003; polyps 2010, Dr Carlean Purl  . KNEE ARTHROSCOPY     Left  . LAPAROSCOPIC APPENDECTOMY N/A 10/05/2015   Procedure: APPENDECTOMY LAPAROSCOPIC;  Surgeon: Judeth Horn, MD;  Location: [facility];  Service: General;  Laterality: N/A;  . NOSE SURGERY     Dr  Truman Hayward  . ROTATOR CUFF REPAIR     left  . SHOULDER ARTHROSCOPY      reports that he has never smoked. He has never used smokeless tobacco. He reports that he drinks about 0.6 - 1.2 oz of alcohol per week . He reports that he does not use drugs. family history includes Arthritis in his mother; Depression in his father, mother, and sister; Diabetes in his maternal grandfather and mother; Heart disease in his father; OCD in his father; Parkinsonism in his mother; Prostate cancer in his father and paternal grandfather; Stroke (age of onset: 48) in his mother. Allergies  Allergen Reactions  . Codeine Nausea Only  . Prozac [Fluoxetine Hcl] Anxiety    Increased anxiety      Review of Systems  Constitutional: Negative for fatigue and unexpected weight change.  Eyes: Negative for visual disturbance.  Respiratory: Negative for cough, chest tightness and shortness of breath.   Cardiovascular: Negative for chest pain, palpitations and leg swelling.  Endocrine: Negative for polydipsia and polyuria.  Neurological: Negative for dizziness, syncope, weakness, light-headedness and headaches.       Objective:   Physical Exam  Constitutional: He appears well-developed and well-nourished.  Cardiovascular: Normal rate and regular rhythm.   Pulmonary/Chest: Effort normal and breath sounds normal. No respiratory distress. He has no wheezes. He has no rales.  Musculoskeletal: He exhibits no edema.  Skin:  Feet somewhat dry but no calluses. No lesions. Normal monofilament testing. Good distal pulses.       Assessment:     Type 2 diabetes. A1c today 7.1%    Plan:     -We discussed options including additional medication versus tightening up diet and reassess in 3 months and he prefers the latter. -Continue regular exercise habits -Set up repeat eye exam -Needs urine microalbumin at follow-up  Eulas Post MD [facility] at [facility]

## 2016-10-10 NOTE — Progress Notes (Signed)
Pre visit review using our clinic review tool, if applicable. No additional management support is needed unless otherwise documented below in the visit note. 

## 2016-12-14 ENCOUNTER — Telehealth: Payer: Self-pay | Admitting: Family Medicine

## 2016-12-14 NOTE — Telephone Encounter (Signed)
Left message for pt to call back and reschedule cpe

## 2016-12-27 ENCOUNTER — Encounter: Payer: 59 | Admitting: Family Medicine

## 2017-01-03 ENCOUNTER — Ambulatory Visit (INDEPENDENT_AMBULATORY_CARE_PROVIDER_SITE_OTHER): Payer: Medicare Other | Admitting: Family Medicine

## 2017-01-03 ENCOUNTER — Telehealth: Payer: Self-pay | Admitting: Family Medicine

## 2017-01-03 ENCOUNTER — Encounter: Payer: Self-pay | Admitting: Family Medicine

## 2017-01-03 VITALS — BP 104/70 | HR 64 | Temp 97.5°F | Ht 74.5 in | Wt 224.5 lb

## 2017-01-03 DIAGNOSIS — Z0000 Encounter for general adult medical examination without abnormal findings: Secondary | ICD-10-CM

## 2017-01-03 DIAGNOSIS — Z23 Encounter for immunization: Secondary | ICD-10-CM

## 2017-01-03 LAB — TSH: TSH: 5.98 u[IU]/mL — ABNORMAL HIGH (ref 0.35–4.50)

## 2017-01-03 LAB — PSA: PSA: 1.78 ng/mL (ref 0.10–4.00)

## 2017-01-03 LAB — CBC WITH DIFFERENTIAL/PLATELET
BASOS ABS: 0 10*3/uL (ref 0.0–0.1)
Basophils Relative: 0.5 % (ref 0.0–3.0)
Eosinophils Absolute: 0.1 10*3/uL (ref 0.0–0.7)
Eosinophils Relative: 1.7 % (ref 0.0–5.0)
HEMATOCRIT: 46 % (ref 39.0–52.0)
HEMOGLOBIN: 16 g/dL (ref 13.0–17.0)
LYMPHS PCT: 28.6 % (ref 12.0–46.0)
Lymphs Abs: 1.5 10*3/uL (ref 0.7–4.0)
MCHC: 34.8 g/dL (ref 30.0–36.0)
MCV: 87.7 fl (ref 78.0–100.0)
MONOS PCT: 6.7 % (ref 3.0–12.0)
Monocytes Absolute: 0.3 10*3/uL (ref 0.1–1.0)
Neutro Abs: 3.3 10*3/uL (ref 1.4–7.7)
Neutrophils Relative %: 62.5 % (ref 43.0–77.0)
Platelets: 170 10*3/uL (ref 150.0–400.0)
RBC: 5.24 Mil/uL (ref 4.22–5.81)
RDW: 13 % (ref 11.5–15.5)
WBC: 5.2 10*3/uL (ref 4.0–10.5)

## 2017-01-03 LAB — BASIC METABOLIC PANEL
BUN: 15 mg/dL (ref 6–23)
CHLORIDE: 101 meq/L (ref 96–112)
CO2: 31 meq/L (ref 19–32)
CREATININE: 0.91 mg/dL (ref 0.40–1.50)
Calcium: 9.1 mg/dL (ref 8.4–10.5)
GFR: 88.82 mL/min (ref >60.00)
Glucose, Bld: 180 mg/dL — ABNORMAL HIGH (ref 70–99)
Potassium: 4.3 mEq/L (ref 3.5–5.1)
Sodium: 138 mEq/L (ref 135–145)

## 2017-01-03 LAB — HEPATIC FUNCTION PANEL
ALBUMIN: 4.1 g/dL (ref 3.5–5.2)
ALK PHOS: 61 U/L (ref 39–117)
ALT: 20 U/L (ref 0–53)
AST: 18 U/L (ref 0–37)
Bilirubin, Direct: 0.2 mg/dL (ref 0.0–0.3)
TOTAL PROTEIN: 6.7 g/dL (ref 6.0–8.3)
Total Bilirubin: 1.6 mg/dL — ABNORMAL HIGH (ref 0.2–1.2)

## 2017-01-03 LAB — HEMOGLOBIN A1C: Hgb A1c MFr Bld: 7.7 % — ABNORMAL HIGH (ref 4.6–6.5)

## 2017-01-03 LAB — LIPID PANEL
CHOL/HDL RATIO: 5
CHOLESTEROL: 165 mg/dL (ref 0–200)
HDL: 30 mg/dL — ABNORMAL LOW (ref >39.00)
NONHDL: 134.75
Triglycerides: 204 mg/dL — ABNORMAL HIGH (ref 0.0–149.0)
VLDL: 40.8 mg/dL — ABNORMAL HIGH (ref 0.0–40.0)

## 2017-01-03 LAB — MICROALBUMIN / CREATININE URINE RATIO
Creatinine,U: 29.6 mg/dL
Microalb Creat Ratio: 2.4 mg/g (ref 0.0–30.0)
Microalb, Ur: <0.7 mg/dL (ref 0.0–1.9)

## 2017-01-03 LAB — LDL CHOLESTEROL, DIRECT: LDL DIRECT: 112 mg/dL

## 2017-01-03 NOTE — Telephone Encounter (Signed)
Pt returning your call

## 2017-01-03 NOTE — Addendum Note (Signed)
Addended by: Westley Hummer B on: 01/03/2017 09:18 AM   Modules accepted: Orders

## 2017-01-03 NOTE — Progress Notes (Signed)
Subjective:     Patient ID: Aaron Bradshaw, male   DOB: [birth year], 66 y.o.   MRN: [id number]  HPI Patient seen for physical exam. His chronic problems include history of type 2 diabetes, history of transverse myelitis, history of recurrent prostatitis, history of adenomatous colon polyps.  Diabetes been well controlled. He exercises daily. He is overdue for eye exam. Will need repeat colonoscopy in 2 years. Needs tetanus as well as Prevnar 13. Also overdue for microalbumin screen. Has no history of hypertension. Remains on metformin and blood sugars well controlled. 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 Anxiety   . 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 Diabetes mellitus ([place]) 6/12   A1c 7.1 %   dx 4 yrs ago  . Gilbert syndrome   . Heart murmur    'when I was younger"  . History of chest pain    Hospitalized ER in [place] for CP and Palpitations with Neg Enzymes  . Hyperlipidemia   . Shingles    2015  . Testosterone deficiency    Dr Hartley Barefoot  . 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 CHOLECYSTECTOMY  2010  . COLONOSCOPY     Tics, Polyp 12/26/2003; polyps 2010, Dr Carlean Purl  . KNEE ARTHROSCOPY     Left  . LAPAROSCOPIC APPENDECTOMY N/A 10/05/2015   Procedure: APPENDECTOMY LAPAROSCOPIC;  Surgeon: Judeth Horn, MD;  Location: [facility];  Service: General;  Laterality: N/A;  . NOSE SURGERY     Dr  Truman Hayward  . ROTATOR CUFF REPAIR     left  . SHOULDER ARTHROSCOPY      reports that he has never smoked. He has never used smokeless tobacco. He reports that he drinks about 0.6 - 1.2 oz of alcohol per week . He reports that he does not use drugs. family history includes Arthritis in his mother; Depression in his father, mother, and sister; Diabetes in his maternal  grandfather and mother; Heart disease in his father; OCD in his father; Parkinsonism in his mother; Prostate cancer in his father and paternal grandfather; Stroke (age of onset: 58) in his mother. Allergies  Allergen Reactions  . Codeine Nausea Only  . Prozac [Fluoxetine Hcl] Anxiety    Increased anxiety     Review of Systems  Constitutional: Negative for activity change, appetite change, fatigue and fever.  HENT: Negative for congestion, ear pain and trouble swallowing.   Eyes: Negative for pain and visual disturbance.  Respiratory: Negative for cough, shortness of breath and wheezing.   Cardiovascular: Negative for chest pain and palpitations.  Gastrointestinal: Negative for abdominal distention, abdominal pain, blood in stool, constipation, diarrhea, nausea, rectal pain and vomiting.  Genitourinary: Negative for dysuria, hematuria and testicular pain.  Musculoskeletal: Negative for arthralgias and joint swelling.  Skin: Negative for rash.  Neurological: Negative for dizziness, syncope and headaches.  Hematological: Negative for adenopathy.  Psychiatric/Behavioral: Negative for confusion and dysphoric mood.       Objective:   Physical Exam  Constitutional: He is oriented to person, place, and time. He appears well-developed and well-nourished. No distress.  HENT:  Head: Normocephalic and atraumatic.  Right Ear: External ear normal.  Left Ear: External ear normal.  Mouth/Throat: Oropharynx is clear and moist.  Eyes: Conjunctivae and EOM are normal. Pupils are equal, round, and reactive to light.  Neck: Normal range of motion. Neck supple. No thyromegaly present.  Cardiovascular: Normal rate, regular rhythm and normal heart sounds.   No murmur heard. Pulmonary/Chest: No respiratory distress. He has no wheezes. He has no rales.  Abdominal: Soft. Bowel sounds are normal. He exhibits no distension and no mass. There is no tenderness. There is no rebound and no guarding.   Musculoskeletal: He exhibits no edema.  Lymphadenopathy:    He has no cervical adenopathy.  Neurological: He is alert and oriented to person, place, and time. He displays normal reflexes. No cranial nerve deficit.  Skin: No rash noted.  Feet reveal no skin lesions. Good distal foot pulses. Good capillary refill. No calluses. Normal sensation.   Psychiatric: He has a normal mood and affect.       Assessment:     Physical exam. Patient needs tetanus booster and Prevnar 13. We'll repeat colonoscopy in 2 years. Generally healthy with well-controlled type 2 diabetes    Plan:     -Tetanus booster and Prevnar 13 given -Pneumovax in 1 year -Set up diabetic eye exam -Repeat colonoscopy in 2 years -Check labs including microalbumin screen -Previous hepatitis C and HIV screening negative -Continue regular exercise habits -Routine follow-up in 3 months  Eulas Post MD [facility] at [facility]

## 2017-01-03 NOTE — Patient Instructions (Signed)
Set up repeat eye exam We gave Tdap (tetanus booster) and Prevnar 13 today Will need pneumovax next year

## 2017-01-03 NOTE — Telephone Encounter (Signed)
Pt had been scheduled

## 2017-01-04 NOTE — Telephone Encounter (Signed)
Patient is aware of lab results.

## 2017-01-11 ENCOUNTER — Encounter: Payer: Self-pay | Admitting: Family Medicine

## 2017-01-11 LAB — HM DIABETES EYE EXAM

## 2017-04-05 ENCOUNTER — Ambulatory Visit: Payer: Medicare Other | Admitting: Family Medicine

## 2017-04-12 ENCOUNTER — Ambulatory Visit (INDEPENDENT_AMBULATORY_CARE_PROVIDER_SITE_OTHER): Payer: Medicare Other | Admitting: Family Medicine

## 2017-04-12 ENCOUNTER — Encounter: Payer: Self-pay | Admitting: Family Medicine

## 2017-04-12 VITALS — BP 102/70 | HR 61 | Temp 98.0°F | Wt 219.7 lb

## 2017-04-12 DIAGNOSIS — E119 Type 2 diabetes mellitus without complications: Secondary | ICD-10-CM

## 2017-04-12 DIAGNOSIS — Z23 Encounter for immunization: Secondary | ICD-10-CM

## 2017-04-12 LAB — POCT GLYCOSYLATED HEMOGLOBIN (HGB A1C): Hemoglobin A1C: 6.4

## 2017-04-12 NOTE — Progress Notes (Signed)
Subjective:     Patient ID: Aaron Bradshaw, male   DOB: 1950-12-31, 66 y.o.   MRN: [id number]  HPI Patient seen for follow-up regarding type 2 diabetes. Last A1c 7.7%. Patient preferred trial of lifestyle modification versus additional medication. He takes metformin 750 mg extended release twice daily. He started a new dietary program called "[employer]"-which is predominantly educational and focuses on reducing glycemic foods. He's had some classes and videos for instruction. He lost about 5 or 6 pounds and blood sugars greatly improved. A1c today 6.4%. Feels better overall. Exercising about one hour per day.  Past Medical History:  Diagnosis Date  . Anxiety   . 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 Diabetes mellitus ([place]) 6/12   A1c 7.1 %   dx 4 yrs ago  . Gilbert syndrome   . Heart murmur    'when I was younger"  . History of chest pain    Hospitalized ER in [place] for CP and Palpitations with Neg Enzymes  . Hyperlipidemia   . Shingles    2015  . Testosterone deficiency    Dr Hartley Barefoot  . 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 CHOLECYSTECTOMY  2010  . COLONOSCOPY     Tics, Polyp 12/26/2003; polyps 2010, Dr Carlean Purl  . KNEE ARTHROSCOPY     Left  . LAPAROSCOPIC APPENDECTOMY N/A 10/05/2015   Procedure: APPENDECTOMY LAPAROSCOPIC;  Surgeon: Judeth Horn, MD;  Location: [facility];  Service: General;  Laterality: N/A;  . NOSE SURGERY     Dr  Truman Hayward  . ROTATOR CUFF REPAIR     left  . SHOULDER ARTHROSCOPY      reports that he has never smoked. He has never used smokeless tobacco. He reports that he drinks about 0.6 - 1.2 oz of alcohol per week . He reports that he does not use drugs. family history includes Arthritis in his mother; Depression in his father, mother, and sister; Diabetes in his maternal grandfather and mother; Heart disease in his father; OCD in his father; Parkinsonism in his mother; Prostate cancer in his father and  paternal grandfather; Stroke (age of onset: 81) in his mother. Allergies  Allergen Reactions  . Codeine Nausea Only  . Prozac [Fluoxetine Hcl] Anxiety    Increased anxiety     Review of Systems  Constitutional: Negative for fatigue.  Eyes: Negative for visual disturbance.  Respiratory: Negative for cough, chest tightness and shortness of breath.   Cardiovascular: Negative for chest pain, palpitations and leg swelling.  Neurological: Negative for dizziness, syncope, weakness, light-headedness and headaches.       Objective:   Physical Exam  Constitutional: He is oriented to person, place, and time. He appears well-developed and well-nourished.  HENT:  Right Ear: External ear normal.  Left Ear: External ear normal.  Mouth/Throat: Oropharynx is clear and moist.  Eyes: Pupils are equal, round, and reactive to light.  Neck: Neck supple. No thyromegaly present.  Cardiovascular: Normal rate and regular rhythm.   Pulmonary/Chest: Effort normal and breath sounds normal. No respiratory distress. He has no wheezes. He has no rales.  Musculoskeletal: He exhibits no edema.  Neurological: He is alert and oriented to person, place, and time.       Assessment:     Type 2 diabetes greatly improved with A1c 6.4%    Plan:     -Continue current regimen. We'll plan routine follow-up in 3 months.  If A1C still reducing at that point consider reducing his metformin to once daily. -Flu vaccine given  Eulas Post MD [facility] at [facility]

## 2017-04-13 ENCOUNTER — Encounter: Payer: Self-pay | Admitting: Family Medicine

## 2017-05-08 ENCOUNTER — Ambulatory Visit: Payer: Medicare Other | Admitting: Family Medicine

## 2017-05-09 ENCOUNTER — Other Ambulatory Visit: Payer: Self-pay | Admitting: *Deleted

## 2017-05-09 MED ORDER — METFORMIN HCL ER 750 MG PO TB24: 750.0000 mg | ORAL_TABLET | Freq: Two times a day (BID) | ORAL | 1 refills | Status: DC

## 2017-05-11 ENCOUNTER — Ambulatory Visit (INDEPENDENT_AMBULATORY_CARE_PROVIDER_SITE_OTHER): Payer: Medicare Other | Admitting: Sports Medicine

## 2017-05-11 VITALS — BP 124/76 | Ht 75.0 in | Wt 214.0 lb

## 2017-05-11 DIAGNOSIS — M25511 Pain in right shoulder: Secondary | ICD-10-CM

## 2017-05-11 DIAGNOSIS — M533 Sacrococcygeal disorders, not elsewhere classified: Secondary | ICD-10-CM | POA: Diagnosis not present

## 2017-05-11 NOTE — Progress Notes (Signed)
   Subjective:    Patient ID: Marvetta Gibbons, male    DOB: October 18, 1950, 66 y.o.   MRN: [id number]  HPI chief complaint: Right shoulder pain  [name] is a right-hand-dominant 66 year old male that comes in today complaining of 1 month of right shoulder pain. Pain began after he fell directly on his shoulder while gardening. He had diffuse pain immediately which has persisted but improved. It's most noticeable when sleeping at night. He will notice the pain when sleeping directly on his right shoulder. Otherwise, he functions pretty well during the day. He has noticed some limitation in internal rotation but otherwise has maintained good range of motion. He has not noticed any weakness. He denies numbness or tingling. He has a history of a left shoulder rotator cuff repair done many years ago by Dr. Noemi Chapel. He has done well postoperatively. His right shoulder pain feels different than the pain he had with his previous rotator cuff injury. He takes occasional over-the-counter pain medication to help him sleep. He is also complaining of some right-sided low back pain which has been present for many months. He has a history of transverse myelitis which resolved after several months of treatment and physical therapy. He localizes his back pain to the SI joint. He denies numbness or tingling into his legs. No weakness. No change in bowel or bladder.  Past medical history reviewed Medications reviewed Allergies reviewed    Review of Systems    as above Objective:   Physical Exam  Well-developed, fit appearing. No acute distress. Awake alert and oriented 3. Vital signs reviewed  Right shoulder: No gross deformity. No tenderness to palpation. Patient has full active and passive forward flexion and abduction. Full external rotation but internal rotation is limited to about 70-80. He has 5/5 strength with rotator cuff stressing. Slight pain with empty can and Hawkins testing. Negative O'Brien's.  Neurovascularly intact distally.  Lumbar spine: Good range of motion. He is tender to palpation directly over the right SI joint with a positive FABER. No spasm. Neurovascularly intact distally.      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 He will get x-rays of his shoulder today and will follow-up early next week for an ultrasound of his shoulder to evaluate his rotator cuff. For his SI joint dysfunction, I recommend a referral to [facility] and he can wean to a home exercise program per Dr. Hart Carwin discretion.

## 2017-05-13 ENCOUNTER — Other Ambulatory Visit: Payer: Self-pay | Admitting: Family Medicine

## 2017-05-15 ENCOUNTER — Ambulatory Visit (INDEPENDENT_AMBULATORY_CARE_PROVIDER_SITE_OTHER): Payer: Medicare Other | Admitting: Sports Medicine

## 2017-05-15 ENCOUNTER — Ambulatory Visit
Admission: RE | Admit: 2017-05-15 | Discharge: 2017-05-15 | Disposition: A | Discharge status: Home / Self Care | Payer: Medicare Other | Source: Ambulatory Visit | Attending: Sports Medicine | Admitting: Sports Medicine

## 2017-05-15 VITALS — BP 132/84 | Ht 75.0 in | Wt 215.0 lb

## 2017-05-15 DIAGNOSIS — M25511 Pain in right shoulder: Secondary | ICD-10-CM

## 2017-05-15 NOTE — Progress Notes (Addendum)
  Patient comes in today for an ultrasound of his right shoulder. Please see the office note from last week for details regarding history and physical exam findings.  Right shoulder ultrasound: Limited images obtained. Biceps tendon well visualized within the bicipital groove. There are some hypoechoic changes in the proximal tendon consistent with tendinopathy. Supraspinatus, infraspinatus, and subscapularis are all visualized. There is some hypoechoic change in the distal infraspinatus tendon. Acromioclavicular joint shows a positive mushroom sign. There is also some spurring off of the humeral head. Glenohumeral joint appears unremarkable.  Patient will proceed with an x-ray of his right shoulder as ordered. We will call him with those results. He is getting ready to start physical therapy with [facility] Barthel and he will follow-up with me in 4 weeks for reevaluation. If symptoms do not improve then I would consider merits of further diagnostic imaging. Of note, patient has had an excellent postoperative outcome with previous left shoulder rotator cuff surgery done by Dr. Noemi Chapel.  Total time spent with the patient was 15 minutes with greater than 50% of the time spent in face-to-face consultation performing the ultrasound, explain the results, and setting up a treatment plan.  Addendum: X-rays reviewed. 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 Nothing acute.

## 2017-06-13 ENCOUNTER — Ambulatory Visit: Payer: Medicare Other | Admitting: Sports Medicine

## 2017-06-13 ENCOUNTER — Encounter: Payer: Self-pay | Admitting: Sports Medicine

## 2017-06-13 DIAGNOSIS — M25511 Pain in right shoulder: Secondary | ICD-10-CM | POA: Diagnosis not present

## 2017-06-13 NOTE — Progress Notes (Signed)
   Subjective:    Patient ID: Aaron Bradshaw, male    DOB: 08-Aug-1950, 66 y.o.   MRN: [id number]  HPI   Patient comes in today for follow-up on right shoulder pain. Pain is improving but he is still noticing some weakness. Still having pain at night when sleeping on his shoulder. Previous ultrasound showed some tendinopathy of the infraspinatus but I did not appreciate a tear of the supraspinatus. He has been working in physical therapy which has been helpful.   Review of Systems    as above Objective:   Physical Exam  Well-developed, well-nourished. No acute distress  Right shoulder: Full range of motion. 4+/5 strength with resisted supraspinatus and 4/5 strength with resisted external rotation. 5/5 strength with resisted internal rotation. No atrophy. Neurovascularly intact distally.  Previous x-rays of the right shoulder showed acromioclavicular joint DJD but nothing acute      Assessment & Plan:   Right shoulder pain worrisome for rotator cuff tear  Patient's pain all began after a fall while gardening. Although his pain is improving he still has weakness on exam. I'm concerned that he may have a full-thickness rotator cuff tear requiring surgery. Therefore, I'm going to order an MRI of the right shoulder to evaluate further. Phone follow-up with those results when available. We will delineate further treatment based on those findings. Of note, the patient has had a previous left shoulder rotator cuff repair done by Dr. Noemi Chapel about 14 years ago and has done well postoperatively.

## 2017-06-26 ENCOUNTER — Telehealth: Payer: Self-pay | Admitting: Sports Medicine

## 2017-06-26 ENCOUNTER — Ambulatory Visit
Admission: RE | Admit: 2017-06-26 | Discharge: 2017-06-26 | Disposition: A | Discharge status: Home / Self Care | Payer: Medicare Other | Source: Ambulatory Visit | Attending: Sports Medicine | Admitting: Sports Medicine

## 2017-06-26 DIAGNOSIS — M25511 Pain in right shoulder: Secondary | ICD-10-CM

## 2017-06-26 NOTE — Telephone Encounter (Signed)
I spoke with Aaron Bradshaw on the phone today after reviewing the MRI of his right shoulder. He has a complete tear of the supraspinatus tendon with retraction. Complete tear of the long head of the biceps tendon as well. Patient will be referred to Dr. Noemi Chapel to discuss merits of rotator cuff repair and probable biceps tenodesis or tenotomy.

## 2017-06-27 ENCOUNTER — Other Ambulatory Visit: Payer: Self-pay

## 2017-06-27 DIAGNOSIS — M25511 Pain in right shoulder: Secondary | ICD-10-CM

## 2017-07-10 ENCOUNTER — Ambulatory Visit: Payer: Medicare Other | Admitting: Family Medicine

## 2017-07-12 ENCOUNTER — Encounter: Payer: Self-pay | Admitting: Family Medicine

## 2017-07-12 ENCOUNTER — Ambulatory Visit: Payer: Medicare Other | Admitting: Family Medicine

## 2017-07-12 VITALS — BP 102/64 | HR 66 | Temp 98.2°F | Wt 224.4 lb

## 2017-07-12 DIAGNOSIS — E119 Type 2 diabetes mellitus without complications: Secondary | ICD-10-CM

## 2017-07-12 LAB — POCT GLYCOSYLATED HEMOGLOBIN (HGB A1C): Hemoglobin A1C: 7.5

## 2017-07-12 NOTE — Progress Notes (Signed)
Subjective:     Patient ID: Aaron Bradshaw, male   DOB: May 20, 1951, 66 y.o.   MRN: [id number]  HPI Patient here for follow-up type 2 diabetes. He has had generally good control. Unfortunately, he fell and had rotator cuff tear right shoulder which required surgery January 4. He had complete tear of supraspinatus tendon. Up until then he was swimming about a mile and a half several days per week. Less consistent exercise since then. Has also had poor compliance with diet. Remains on metformin twice daily  Past Medical History:  Diagnosis Date  . Anxiety   . 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 Diabetes mellitus ([place]) 6/12   A1c 7.1 %   dx 4 yrs ago  . Gilbert syndrome   . Heart murmur    'when I was younger"  . History of chest pain    Hospitalized ER in [place] for CP and Palpitations with Neg Enzymes  . Hyperlipidemia   . Shingles    2015  . Testosterone deficiency    Dr Hartley Barefoot  . 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 CHOLECYSTECTOMY  2010  . COLONOSCOPY     Tics, Polyp 12/26/2003; polyps 2010, Dr Carlean Purl  . KNEE ARTHROSCOPY     Left  . LAPAROSCOPIC APPENDECTOMY N/A 10/05/2015   Procedure: APPENDECTOMY LAPAROSCOPIC;  Surgeon: Judeth Horn, MD;  Location: [facility];  Service: General;  Laterality: N/A;  . NOSE SURGERY     Dr  Truman Hayward  . ROTATOR CUFF REPAIR     left  . SHOULDER ARTHROSCOPY      reports that  has never smoked. he has never used smokeless tobacco. He reports that he drinks about 0.6 - 1.2 oz of alcohol per week. He reports that he does not use drugs. family history includes Arthritis in his mother; Depression in his father, mother, and sister; Diabetes in his maternal grandfather and mother; Heart disease in his father; OCD in his father; Parkinsonism in his mother; Prostate cancer in his father and paternal grandfather; Stroke (age of onset: 21) in his mother. Allergies  Allergen Reactions  . Codeine Nausea  Only  . Prozac [Fluoxetine Hcl] Anxiety    Increased anxiety     Review of Systems  Constitutional: Negative for fatigue.  Eyes: Negative for visual disturbance.  Respiratory: Negative for cough, chest tightness and shortness of breath.   Cardiovascular: Negative for chest pain, palpitations and leg swelling.  Neurological: Negative for dizziness, syncope, weakness, light-headedness and headaches.       Objective:   Physical Exam  Constitutional: He is oriented to person, place, and time. He appears well-developed and well-nourished.  HENT:  Right Ear: External ear normal.  Left Ear: External ear normal.  Mouth/Throat: Oropharynx is clear and moist.  Eyes: Pupils are equal, round, and reactive to light.  Neck: Neck supple. No thyromegaly present.  Cardiovascular: Normal rate and regular rhythm.  Pulmonary/Chest: Effort normal and breath sounds normal. No respiratory distress. He has no wheezes. He has no rales.  Musculoskeletal: He exhibits no edema.  Neurological: He is alert and oriented to person, place, and time.       Assessment:     Type 2 diabetes. Slightly worsening control with A1c today 7.5%.    Plan:     -Tighten up diet and we have also encouraged him look at alternative exercises such as walking while his shoulder is recovering -We  discussed options of additional medication and at this point he wishes to work on lifestyle modification and reassess in 3 months.  Eulas Post MD [facility]

## 2017-07-12 NOTE — Patient Instructions (Signed)
Tighten up diet and look at alternative exercise such as walking until shoulder recovered.

## 2017-07-13 ENCOUNTER — Telehealth: Payer: Self-pay | Admitting: *Deleted

## 2017-07-13 NOTE — Telephone Encounter (Signed)
Noted  

## 2017-07-13 NOTE — Telephone Encounter (Signed)
He declines statin use.  We discussed just this week.

## 2017-07-13 NOTE — Telephone Encounter (Signed)
[facility] recommends patient be prescribed a statin due to having a diagnosis of diabetes. If you agree, please advise medication and I will contact patient and send to pharmacy.  Appears he has declined statin in the past, but not sure if this has been discussed recently. Thanks!

## 2017-09-12 ENCOUNTER — Ambulatory Visit: Payer: Medicare Other

## 2017-10-10 ENCOUNTER — Ambulatory Visit: Payer: Medicare Other

## 2017-10-10 ENCOUNTER — Encounter: Payer: Self-pay | Admitting: Family Medicine

## 2017-10-10 ENCOUNTER — Ambulatory Visit: Payer: Medicare Other | Admitting: Family Medicine

## 2017-10-10 VITALS — BP 130/82 | HR 60 | Temp 98.0°F | Wt 224.6 lb

## 2017-10-10 DIAGNOSIS — E119 Type 2 diabetes mellitus without complications: Secondary | ICD-10-CM

## 2017-10-10 LAB — POCT GLYCOSYLATED HEMOGLOBIN (HGB A1C): Hemoglobin A1C: 8.2

## 2017-10-10 NOTE — Progress Notes (Signed)
Subjective:     Patient ID: Aaron Bradshaw, male   DOB: 02-26-1951, 67 y.o.   MRN: [id number]  HPI Patient here for follow-up type 2 diabetes. His back swimming more regularly and also does some weights. He states he has been fairly diligent with diet since last visit. Last A1c of 7.5%. Feels well overall. No history of elevated blood pressure. He is declines statin use in the past. Gets regular eye exams. No recent neuropathy symptoms.  Past Medical History:  Diagnosis Date  . Anxiety   . 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 Diabetes mellitus ([place]) 6/12   A1c 7.1 %   dx 4 yrs ago  . Gilbert syndrome   . Heart murmur    'when I was younger"  . History of chest pain    Hospitalized ER in [place] for CP and Palpitations with Neg Enzymes  . Hyperlipidemia   . Shingles    2015  . Testosterone deficiency    Dr Hartley Barefoot  . 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 CHOLECYSTECTOMY  2010  . COLONOSCOPY     Tics, Polyp 12/26/2003; polyps 2010, Dr Carlean Purl  . KNEE ARTHROSCOPY     Left  . LAPAROSCOPIC APPENDECTOMY N/A 10/05/2015   Procedure: APPENDECTOMY LAPAROSCOPIC;  Surgeon: Judeth Horn, MD;  Location: [facility];  Service: General;  Laterality: N/A;  . NOSE SURGERY     Dr  Truman Hayward  . ROTATOR CUFF REPAIR     left  . SHOULDER ARTHROSCOPY      reports that  has never smoked. he has never used smokeless tobacco. He reports that he drinks about 0.6 - 1.2 oz of alcohol per week. He reports that he does not use drugs. family history includes Arthritis in his mother; Depression in his father, mother, and sister; Diabetes in his maternal grandfather and mother; Heart disease in his father; OCD in his father; Parkinsonism in his mother; Prostate cancer in his father and paternal grandfather; Stroke (age of onset: 53) in his mother. Allergies  Allergen Reactions  . Codeine Nausea Only  . Prozac [Fluoxetine Hcl] Anxiety    Increased anxiety      Review of Systems  Constitutional: Negative for fatigue.  Eyes: Negative for visual disturbance.  Respiratory: Negative for cough, chest tightness and shortness of breath.   Cardiovascular: Negative for chest pain, palpitations and leg swelling.  Endocrine: Negative for polydipsia and polyuria.  Neurological: Negative for dizziness, syncope, weakness, light-headedness and headaches.       Objective:   Physical Exam  Constitutional: He is oriented to person, place, and time. He appears well-developed and well-nourished.  HENT:  Right Ear: External ear normal.  Left Ear: External ear normal.  Mouth/Throat: Oropharynx is clear and moist.  Eyes: Pupils are equal, round, and reactive to light.  Neck: Neck supple. No thyromegaly present.  Cardiovascular: Normal rate and regular rhythm.  Pulmonary/Chest: Effort normal and breath sounds normal. No respiratory distress. He has no wheezes. He has no rales.  Musculoskeletal: He exhibits no edema.  Neurological: He is alert and oriented to person, place, and time.       Assessment:     Type 2 diabetes poorly controlled with A1c today 8.2%    Plan:     -We strongly encouraged him to consider additional medication this point-with considerations including DPP 4 inhibitor, GLP-1 class, or SGLT 2 class. He declines -Patient is asking  for 3 more months with diligence and exercise and diet. If A1c not better that point we will strongly advocate one of the above classes. He had previous problems with hypoglycemia with sulfonylurea  Eulas Post MD [facility] at [facility]

## 2017-10-16 ENCOUNTER — Other Ambulatory Visit: Payer: Self-pay | Admitting: *Deleted

## 2017-10-16 MED ORDER — GLUCOSE BLOOD VI STRP: ORAL_STRIP | 3 refills | Status: DC

## 2017-11-24 NOTE — Progress Notes (Addendum)
Subjective:   Aaron Bradshaw is a 67 y.o. male who presents for an Initial Medicare Annual Wellness Visit.  Here for INITIAL AWV  3 children and 6 grands Fairly close    Reports health as control of DM DM - DOCTOR ADVOCATING DIET; EXERCISE AND  OTHER MED   [employer] did a home visit  Transverse myelitis - mid back - shingles   Diet Lost weight from 227; 222  Chol/hdl 5; trig 204 A1c recent 8.2 A1c 7.4 x 2 weeks at home  Did Health dare diet A1c down to 6.3;  Eat no white flour, sugar,  Limit meat;   BMI 25.7   Exercise  Swimming and weights Tore rotator cuff after falling over bricks in yard last Sept Surgery surgery in January  Back swimming 2 to 3 hours a day x 4 to 5 days a week   There are no preventive care reminders to display for this patient.  Colonoscopy recalled q 3 years;  07/2018  PSA - goes to urologist     Objective:    Today's Vitals   11/28/17 0902  BP: 128/84  Pulse: (!) 58  SpO2: 97%  Weight: 220 lb (99.8 kg)  Height: 6\' 3"  (1.905 m)   Body mass index is 27.5 kg/m.  Advanced Directives 11/28/2017 09/25/2015 08/05/2015 07/30/2015 01/08/2015  Does Patient Have a Medical Advance Directive? No No No No No  Would patient like information on creating a medical advance directive? - Yes - Educational materials given - No - patient declined information No - patient declined information    Current Medications (verified) Outpatient Encounter Medications as of 11/28/2017  Medication Sig  . Ascorbic Acid (VITAMIN C) 250 MG tablet Take 250 mg by mouth daily.    Marland Kitchen aspirin 81 MG tablet Take 81 mg by mouth daily.    Marland Kitchen b complex vitamins capsule Take 1 capsule by mouth daily.    . CHOLECALCIFEROL PO Take 1 tablet by mouth daily.  Marland Kitchen EPIPEN 2-PAK 0.3 MG/0.3ML SOAJ injection USE AS DIRECTED AS NEEDED  . glucose blood (ONE TOUCH ULTRA TEST) test strip Use once daily.  Dx E11.9  . KRILL OIL PO Take 1 capsule by mouth daily.  Marland Kitchen MAGNESIUM PO Take 1 tablet by mouth  daily.  . metFORMIN (GLUCOPHAGE-XR) 750 MG 24 hr tablet TAKE 1 TABLET (750 MG TOTAL) BY MOUTH 2 (TWO) TIMES DAILY.  . Multiple Vitamin (MULTIVITAMIN) tablet Take 1 tablet by mouth daily.    Marland Kitchen PRESCRIPTION MEDICATION Inject 1 each into the skin every 7 (seven) days. Patient gets an allergy shot once a week at [place] off of [address]   No facility-administered encounter medications on file as of 11/28/2017.     Allergies (verified) Codeine and Prozac [fluoxetine hcl]   History: Past Medical History:  Diagnosis Date  . Anxiety   . 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 Diabetes mellitus ([place]) 6/12   A1c 7.1 %   dx 4 yrs ago  . Gilbert syndrome   . Heart murmur    'when I was younger"  . History of chest pain    Hospitalized ER in [place] for CP and Palpitations with Neg Enzymes  . Hyperlipidemia   . Shingles    2015  . Testosterone deficiency    Dr Hartley Barefoot  . 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 CHOLECYSTECTOMY  2010  . COLONOSCOPY  Tics, Polyp 12/26/2003; polyps 2010, Dr Carlean Purl  . KNEE ARTHROSCOPY     Left  . LAPAROSCOPIC APPENDECTOMY N/A 10/05/2015   Procedure: APPENDECTOMY LAPAROSCOPIC;  Surgeon: Judeth Horn, MD;  Location: [facility];  Service: General;  Laterality: N/A;  . NOSE SURGERY     Dr  Truman Hayward  . ROTATOR CUFF REPAIR     left  . SHOULDER ARTHROSCOPY     Family History  Problem Relation Age of Onset  . Depression Father   . Heart disease Father        MI in 39s  . Prostate cancer Father   . OCD Father   . Depression Mother   . Arthritis Mother   . Stroke Mother 86  . Parkinsonism Mother   . Diabetes Mother   . Depression Sister        OCD  . Prostate cancer Paternal Grandfather   . Diabetes Maternal Grandfather        MI in 68s  . Colon cancer Neg Hx    Social History   Socioeconomic History  . Marital status: Married    Spouse name: Not on file  . Number of children: Not on file  . Years  of education: Not on file  . Highest education level: Not on file  Occupational History  . Not on file  Social Needs  . Financial resource strain: Not on file  . Food insecurity:    Worry: Not on file    Inability: Not on file  . Transportation needs:    Medical: Not on file    Non-medical: Not on file  Tobacco Use  . Smoking status: Never Smoker  . Smokeless tobacco: Never Used  Substance and Sexual Activity  . Alcohol use: Yes    Alcohol/week: 0.6 - 1.2 oz    Types: 1 - 2 Standard drinks or equivalent per week    Comment: beer occ stopped wine  . Drug use: No  . Sexual activity: Not on file  Lifestyle  . Physical activity:    Days per week: Not on file    Minutes per session: Not on file  . Stress: Not on file  Relationships  . Social connections:    Talks on phone: Not on file    Gets together: Not on file    Attends religious service: Not on file    Active member of club or organization: Not on file    Attends meetings of clubs or organizations: Not on file    Relationship status: Not on file  Other Topics Concern  . Not on file  Social History Narrative  . Not on file   Tobacco Counseling Counseling given: Yes   Clinical Intake:   Activities of Daily Living In your present state of health, do you have any difficulty performing the following activities: 11/28/2017  Hearing? N  Vision? Y  Comment reading glasses   Difficulty concentrating or making decisions? N  Walking or climbing stairs? N  Dressing or bathing? N  Doing errands, shopping? N  Preparing Food and eating ? N  Using the Toilet? N  In the past six months, have you accidently leaked urine? N  Do you have problems with loss of bowel control? N  Managing your Medications? N  Managing your Finances? N  Housekeeping or managing your Housekeeping? N  Some recent data might be hidden     Immunizations and Health Maintenance Immunization History  Administered Date(s) Administered  . Influenza  Whole  05/25/2012  . Influenza, High Dose Seasonal PF 04/12/2017  . Influenza,inj,Quad PF,6+ Mos 04/17/2013, 07/02/2015, 04/18/2016  . Pneumococcal Conjugate-13 01/03/2017  . Pneumococcal Polysaccharide-23 05/16/2013  . Tdap 01/03/2017  . Zoster 05/14/2014   There are no preventive care reminders to display for this patient.  Patient Care Team: Eulas Post, MD as PCP - General (Family Medicine)  Dermatologist Wilhemina Bonito PSA urologist    Indicate any recent Medical Services you may have received from other than [facility] providers in the past year (date may be approximate).    Assessment:   This is a routine wellness examination for Aaron Bradshaw.  Hearing/Vision screen  Hearing Screening   125Hz  250Hz  500Hz  1000Hz  2000Hz  3000Hz  4000Hz  6000Hz  8000Hz   Right ear:       100    Left ear:       100    Comments: 4000 hz   Vision Screening Comments: Vision checks Dr. Delman Cheadle annually  Eyes look good No damage from the myelitis  No diabetic retinopathy  Dietary issues and exercise activities discussed:    Goals    . Patient Stated     Lose down to 215lb Continue to monitor your BS Continue your great exercise plan!        Depression Screen PHQ 2/9 Scores 11/28/2017 10/10/2017 10/10/2016  PHQ - 2 Score 0 0 0    Fall Risk Fall Risk  11/28/2017 10/10/2017 10/10/2016  Falls in the past year? Yes Yes No  Number falls in past yr: 1 1 -  Injury with Fall? Yes Yes -  Follow up Education provided - -  Comment last year rotator cuff  - -      Cognitive Function: Ad8 score reviewed for issues:  Issues making decisions:  Less interest in hobbies / activities:  Repeats questions, stories (family complaining):  Trouble using ordinary gadgets (microwave, computer, phone):  Forgets the month or year:   Mismanaging finances:   Remembering appts:  Daily problems with thinking and/or memory: Ad8 score is=0 Still working at golf course      MMSE - Mini Mental State Exam  11/28/2017  Not completed: (No Data)  no issues to date; independent; good historian       Screening Tests Health Maintenance  Topic Date Due  . FOOT EXAM  01/03/2018  . URINE MICROALBUMIN  01/03/2018  . OPHTHALMOLOGY EXAM  01/11/2018  . INFLUENZA VACCINE  02/22/2018  . HEMOGLOBIN A1C  04/12/2018  . PNA vac Low Risk Adult (2 of 2 - PPSV23) 05/16/2018  . COLONOSCOPY  08/04/2018  . TETANUS/TDAP  01/04/2027  . Hepatitis C Screening  Completed         Plan:      PCP Notes   Health Maintenance Colonoscopy repeat in 2020;  PSA followed by urologist   Abnormal Screens  States he has some loss of sensation due to transverse myelitis  Balance is not as good but still very athletic; swims and exercises 2 to 3 hours 4 to 5 days a week  Referrals  no  Patient concerns; Not at this time. [employer] nurse out x 2 weeks ago and A1c down to 7.4 Is on strict diet which is lean without sugar   Nurse Concerns; As noted   Next PCP apt 01/10/2018       I have personally reviewed and noted the following in the patient's chart:   . Medical and social history . Use of alcohol, tobacco or illicit drugs  . Current medications  and supplements . Functional ability and status . Nutritional status . Physical activity . Advanced directives . List of other physicians . Hospitalizations, surgeries, and ER visits in previous 12 months . Vitals . Screenings to include cognitive, depression, and falls . Referrals and appointments  In addition, I have reviewed and discussed with patient certain preventive protocols, quality metrics, and best practice recommendations. A written personalized care plan for preventive services as well as general preventive health recommendations were provided to patient.     TFTDD,UKGUR, RN   11/28/2017   Above reviewed and agree.   He has been diligent with exercise for years.    Eulas Post MD [facility] at [facility]

## 2017-11-28 ENCOUNTER — Ambulatory Visit (INDEPENDENT_AMBULATORY_CARE_PROVIDER_SITE_OTHER): Payer: Medicare Other

## 2017-11-28 VITALS — BP 128/84 | HR 58 | Ht 75.0 in | Wt 220.0 lb

## 2017-11-28 DIAGNOSIS — Z0000 Encounter for general adult medical examination without abnormal findings: Secondary | ICD-10-CM

## 2017-11-28 NOTE — Patient Instructions (Addendum)
Aaron Bradshaw , Thank you for taking time to come for your Medicare Wellness Visit. I appreciate your ongoing commitment to your health goals. Please review the following plan we discussed and let me know if I can assist you in the future.   Shingrix is a vaccine for the prevention of Shingles in Adults 50 and older.  If you are on Medicare, the shingrix is covered under your Part D plan, so you will take both of the vaccines in the series at your pharmacy. Please check with your benefits regarding applicable copays or out of pocket expenses.  The Shingrix is given in 2 vaccines approx 8 weeks apart. You must receive the 2nd dose prior to 6 months from receipt of the first. Please have the pharmacist print out you Immunization  dates for our office records   These are the goals we discussed: Goals    . Patient Stated     Lose down to 215lb Continue to monitor your BS Continue your great exercise plan!         This is a list of the screening recommended for you and due dates:  Health Maintenance  Topic Date Due  . Complete foot exam   01/03/2018  . Urine Protein Check  01/03/2018  . Eye exam for diabetics  01/11/2018  . Flu Shot  02/22/2018  . Hemoglobin A1C  04/12/2018  . Pneumonia vaccines (2 of 2 - PPSV23) 05/16/2018  . Colon Cancer Screening  08/04/2018  . Tetanus Vaccine  01/04/2027  .  Hepatitis C: One time screening is recommended by Center for Disease Control  (CDC) for  adults born from [birth year] through 1965.   Completed    Prevention of falls: Remove rugs or any tripping hazards in the home Use Non slip mats in bathtubs and showers Placing grab bars next to the toilet and or shower Placing handrails on both sides of the stair way Adding extra lighting in the home.   Personal safety issues reviewed:  1. Consider starting a community watch program per [facility] 2.  Changes batteries is smoke detector and/or carbon monoxide detector  3.  If you have  firearms; keep them in a safe place 4.  Wear protection when in the sun; Always wear sunscreen or a hat; It is good to have your doctor check your skin annually or review any new areas of concern 5. Driving safety; Keep in the right lane; stay 3 car lengths behind the car in front of you on the highway; look 3 times prior to pulling out; carry your cell phone everywhere you go!    Learn about the Yellow Dot program:  The program allows first responders at your emergency to have access to who your physician is, as well as your medications and medical conditions.  Citizens requesting the Yellow Dot Packages should contact Master Corporal Nunzio Cobbs at the [facility] [phone] for the first week of the program and beginning the week after Easter citizens should contact their Scientist, physiological.     Fall Prevention in the Home Falls can cause injuries. They can happen to people of all ages. There are many things you can do to make your home safe and to help prevent falls. What can I do on the outside of my home?  Regularly fix the edges of walkways and driveways and fix any cracks.  Remove anything that might make you trip as you walk through a door, such as  a raised step or threshold.  Trim any bushes or trees on the path to your home.  Use bright outdoor lighting.  Clear any walking paths of anything that might make someone trip, such as rocks or tools.  Regularly check to see if handrails are loose or broken. Make sure that both sides of any steps have handrails.  Any raised decks and porches should have guardrails on the edges.  Have any leaves, snow, or ice cleared regularly.  Use sand or salt on walking paths during winter.  Clean up any spills in your garage right away. This includes oil or grease spills. What can I do in the bathroom?  Use night lights.  Install grab bars by the toilet and in the tub and shower. Do not use towel bars  as grab bars.  Use non-skid mats or decals in the tub or shower.  If you need to sit down in the shower, use a plastic, non-slip stool.  Keep the floor dry. Clean up any water that spills on the floor as soon as it happens.  Remove soap buildup in the tub or shower regularly.  Attach bath mats securely with double-sided non-slip rug tape.  Do not have throw rugs and other things on the floor that can make you trip. What can I do in the bedroom?  Use night lights.  Make sure that you have a light by your bed that is easy to reach.  Do not use any sheets or blankets that are too big for your bed. They should not hang down onto the floor.  Have a firm chair that has side arms. You can use this for support while you get dressed.  Do not have throw rugs and other things on the floor that can make you trip. What can I do in the kitchen?  Clean up any spills right away.  Avoid walking on wet floors.  Keep items that you use a lot in easy-to-reach places.  If you need to reach something above you, use a strong step stool that has a grab bar.  Keep electrical cords out of the way.  Do not use floor polish or wax that makes floors slippery. If you must use wax, use non-skid floor wax.  Do not have throw rugs and other things on the floor that can make you trip. What can I do with my stairs?  Do not leave any items on the stairs.  Make sure that there are handrails on both sides of the stairs and use them. Fix handrails that are broken or loose. Make sure that handrails are as long as the stairways.  Check any carpeting to make sure that it is firmly attached to the stairs. Fix any carpet that is loose or worn.  Avoid having throw rugs at the top or bottom of the stairs. If you do have throw rugs, attach them to the floor with carpet tape.  Make sure that you have a light switch at the top of the stairs and the bottom of the stairs. If you do not have them, ask someone to add  them for you. What else can I do to help prevent falls?  Wear shoes that: ? Do not have high heels. ? Have rubber bottoms. ? Are comfortable and fit you well. ? Are closed at the toe. Do not wear sandals.  If you use a stepladder: ? Make sure that it is fully opened. Do not climb a closed stepladder. ? Make  sure that both sides of the stepladder are locked into place. ? Ask someone to hold it for you, if possible.  Clearly mark and make sure that you can see: ? Any grab bars or handrails. ? First and last steps. ? Where the edge of each step is.  Use tools that help you move around (mobility aids) if they are needed. These include: ? Canes. ? Walkers. ? Scooters. ? Crutches.  Turn on the lights when you go into a dark area. Replace any light bulbs as soon as they burn out.  Set up your furniture so you have a clear path. Avoid moving your furniture around.  If any of your floors are uneven, fix them.  If there are any pets around you, be aware of where they are.  Review your medicines with your doctor. Some medicines can make you feel dizzy. This can increase your chance of falling. Ask your doctor what other things that you can do to help prevent falls. This information is not intended to replace advice given to you by your health care provider. Make sure you discuss any questions you have with your health care provider. Document Released: 05/07/2009 Document Revised: 12/17/2015 Document Reviewed: 08/15/2014 Elsevier Interactive Patient Education  2018 [place] Maintenance, Male A healthy lifestyle and preventive care is important for your health and wellness. Ask your health care provider about what schedule of regular examinations is right for you. What should I know about weight and diet? Eat a Healthy Diet  Eat plenty of vegetables, fruits, whole grains, low-fat dairy products, and lean protein.  Do not eat a lot of foods high in solid fats, added  sugars, or salt.  Maintain a Healthy Weight Regular exercise can help you achieve or maintain a healthy weight. You should:  Do at least 150 minutes of exercise each week. The exercise should increase your heart rate and make you sweat (moderate-intensity exercise).  Do strength-training exercises at least twice a week.  Watch Your Levels of Cholesterol and Blood Lipids  Have your blood tested for lipids and cholesterol every 5 years starting at 66 years of age. If you are at high risk for heart disease, you should start having your blood tested when you are 66 years old. You may need to have your cholesterol levels checked more often if: ? Your lipid or cholesterol levels are high. ? You are older than 66 years of age. ? You are at high risk for heart disease.  What should I know about cancer screening? Many types of cancers can be detected early and may often be prevented. Lung Cancer  You should be screened every year for lung cancer if: ? You are a current smoker who has smoked for at least 30 years. ? You are a former smoker who has quit within the past 15 years.  Talk to your health care provider about your screening options, when you should start screening, and how often you should be screened.  Colorectal Cancer  Routine colorectal cancer screening usually begins at 66 years of age and should be repeated every 5-10 years until you are 66 years old. You may need to be screened more often if early forms of precancerous polyps or small growths are found. Your health care provider may recommend screening at an earlier age if you have risk factors for colon cancer.  Your health care provider may recommend using home test kits to check for hidden blood in the stool.  A small camera at the end of a tube can be used to examine your colon (sigmoidoscopy or colonoscopy). This checks for the earliest forms of colorectal cancer.  Prostate and Testicular Cancer  Depending on your age  and overall health, your health care provider may do certain tests to screen for prostate and testicular cancer.  Talk to your health care provider about any symptoms or concerns you have about testicular or prostate cancer.  Skin Cancer  Check your skin from head to toe regularly.  Tell your health care provider about any new moles or changes in moles, especially if: ? There is a change in a mole's size, shape, or color. ? You have a mole that is larger than a pencil eraser.  Always use sunscreen. Apply sunscreen liberally and repeat throughout the day.  Protect yourself by wearing long sleeves, pants, a wide-brimmed hat, and sunglasses when outside.  What should I know about heart disease, diabetes, and high blood pressure?  If you are 103-41 years of age, have your blood pressure checked every 3-5 years. If you are 5 years of age or older, have your blood pressure checked every year. You should have your blood pressure measured twice-once when you are at a hospital or clinic, and once when you are not at a hospital or clinic. Record the average of the two measurements. To check your blood pressure when you are not at a hospital or clinic, you can use: ? An automated blood pressure machine at a pharmacy. ? A home blood pressure monitor.  Talk to your health care provider about your target blood pressure.  If you are between 61-45 years old, ask your health care provider if you should take aspirin to prevent heart disease.  Have regular diabetes screenings by checking your fasting blood sugar level. ? If you are at a normal weight and have a low risk for diabetes, have this test once every three years after the age of 7. ? If you are overweight and have a high risk for diabetes, consider being tested at a younger age or more often.  A one-time screening for abdominal aortic aneurysm (AAA) by ultrasound is recommended for men aged 26-75 years who are current or former smokers. What  should I know about preventing infection? Hepatitis B If you have a higher risk for hepatitis B, you should be screened for this virus. Talk with your health care provider to find out if you are at risk for hepatitis B infection. Hepatitis C Blood testing is recommended for:  Everyone born from [birth year] through 1964.  Anyone with known risk factors for hepatitis C.  Sexually Transmitted Diseases (STDs)  You should be screened each year for STDs including gonorrhea and chlamydia if: ? You are sexually active and are younger than 66 years of age. ? You are older than 66 years of age and your health care provider tells you that you are at risk for this type of infection. ? Your sexual activity has changed since you were last screened and you are at an increased risk for chlamydia or gonorrhea. Ask your health care provider if you are at risk.  Talk with your health care provider about whether you are at high risk of being infected with HIV. Your health care provider may recommend a prescription medicine to help prevent HIV infection.  What else can I do?  Schedule regular health, dental, and eye exams.  Stay current with your vaccines (immunizations).  Do not use  any tobacco products, such as cigarettes, chewing tobacco, and e-cigarettes. If you need help quitting, ask your health care provider.  Limit alcohol intake to no more than 2 drinks per day. One drink equals 12 ounces of beer, 5 ounces of wine, or 1 ounces of hard liquor.  Do not use street drugs.  Do not share needles.  Ask your health care provider for help if you need support or information about quitting drugs.  Tell your health care provider if you often feel depressed.  Tell your health care provider if you have ever been abused or do not feel safe at home. This information is not intended to replace advice given to you by your health care provider. Make sure you discuss any questions you have with your health care  provider. Document Released: 01/07/2008 Document Revised: 03/09/2016 Document Reviewed: 04/14/2015 Elsevier Interactive Patient Education  [employer].

## 2017-12-02 ENCOUNTER — Other Ambulatory Visit: Payer: Self-pay | Admitting: Family Medicine

## 2017-12-04 LAB — HM DIABETES EYE EXAM

## 2017-12-21 ENCOUNTER — Encounter: Payer: Self-pay | Admitting: Family Medicine

## 2018-01-10 ENCOUNTER — Ambulatory Visit: Payer: Medicare Other | Admitting: Family Medicine

## 2018-01-10 ENCOUNTER — Encounter: Payer: Self-pay | Admitting: Family Medicine

## 2018-01-10 VITALS — BP 120/78 | HR 63 | Temp 97.7°F | Wt 220.5 lb

## 2018-01-10 DIAGNOSIS — Z23 Encounter for immunization: Secondary | ICD-10-CM | POA: Diagnosis not present

## 2018-01-10 DIAGNOSIS — E119 Type 2 diabetes mellitus without complications: Secondary | ICD-10-CM | POA: Diagnosis not present

## 2018-01-10 DIAGNOSIS — E785 Hyperlipidemia, unspecified: Secondary | ICD-10-CM | POA: Diagnosis not present

## 2018-01-10 LAB — POCT GLYCOSYLATED HEMOGLOBIN (HGB A1C): Hemoglobin A1C: 7.4 % — AB (ref 4.0–5.6)

## 2018-01-10 MED ORDER — ROSUVASTATIN CALCIUM 10 MG PO TABS: 10.0000 mg | ORAL_TABLET | Freq: Every day | ORAL | 3 refills | Status: DC

## 2018-01-10 NOTE — Progress Notes (Signed)
Subjective:     Patient ID: Aaron Bradshaw, male   DOB: 1951/01/05, 67 y.o.   MRN: [id number]  HPI Patient here for medical follow-up  Type 2 diabetes. Has had some recent poor control. He has stepped up compliance with diet and is exercising very consistently. He's been able to lower A1c from 8.2% to current 7.4%. Remains on metformin. He thinks he can get A1c less than 7 with lifestyle alone.  History of mild hyperlipidemia. Last LDL cholesterol 108. Continue risk of CAD event 25%. His father had coronary event and stroke around age 39 but he lived to be 71. Patient remains on baby aspirin daily  Past Medical History:  Diagnosis Date  . Anxiety   . 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 Diabetes mellitus ([place]) 6/12   A1c 7.1 %   dx 4 yrs ago  . Gilbert syndrome   . Heart murmur    'when I was younger"  . History of chest pain    Hospitalized ER in [place] for CP and Palpitations with Neg Enzymes  . Hyperlipidemia   . Shingles    2015  . Testosterone deficiency    Dr Hartley Barefoot  . 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 CHOLECYSTECTOMY  2010  . COLONOSCOPY     Tics, Polyp 12/26/2003; polyps 2010, Dr Carlean Purl  . KNEE ARTHROSCOPY     Left  . LAPAROSCOPIC APPENDECTOMY N/A 10/05/2015   Procedure: APPENDECTOMY LAPAROSCOPIC;  Surgeon: Judeth Horn, MD;  Location: [facility];  Service: General;  Laterality: N/A;  . NOSE SURGERY     Dr  Truman Hayward  . ROTATOR CUFF REPAIR     left  . SHOULDER ARTHROSCOPY      reports that he has never smoked. He has never used smokeless tobacco. He reports that he drinks about 0.6 - 1.2 oz of alcohol per week. He reports that he does not use drugs. family history includes Arthritis in his mother; Depression in his father, mother, and sister; Diabetes in his maternal grandfather and mother; Heart disease in his father; OCD in his father; Parkinsonism in his mother; Prostate cancer in his father and paternal  grandfather; Stroke (age of onset: 61) in his mother. Allergies  Allergen Reactions  . Codeine Nausea Only  . Prozac [Fluoxetine Hcl] Anxiety    Increased anxiety      Review of Systems  Constitutional: Negative for fatigue.  Eyes: Negative for visual disturbance.  Respiratory: Negative for cough, chest tightness and shortness of breath.   Cardiovascular: Negative for chest pain, palpitations and leg swelling.  Neurological: Negative for dizziness, syncope, weakness, light-headedness and headaches.       Objective:   Physical Exam  Constitutional: He is oriented to person, place, and time. He appears well-developed and well-nourished.  HENT:  Right Ear: External ear normal.  Left Ear: External ear normal.  Mouth/Throat: Oropharynx is clear and moist.  Eyes: Pupils are equal, round, and reactive to light.  Neck: Neck supple. No thyromegaly present.  Cardiovascular: Normal rate and regular rhythm.  Pulmonary/Chest: Effort normal and breath sounds normal. No respiratory distress. He has no wheezes. He has no rales.  Musculoskeletal: He exhibits no edema.  Neurological: He is alert and oriented to person, place, and time.       Assessment:     #1 type 2 diabetes -improving with A1c 7.4%   #2 history of mild dyslipidemia. 10 year  risk of CAD event 25%     Plan:     -Continue metformin  -Continue lifestyle modification and reassess in 3 months and hopefully A1c less than 7.0% that time  -We discussed risk and benefits of statin use. We recommend he consider statin based on issues above. Start Crestor 10 mg daily. Reassess lipid and hepatic panel follow-up in 3 months   Eulas Post MD [facility] at [facility]

## 2018-03-15 ENCOUNTER — Other Ambulatory Visit: Payer: Self-pay | Admitting: Family Medicine

## 2018-03-27 ENCOUNTER — Encounter: Payer: Self-pay | Admitting: Family Medicine

## 2018-03-27 ENCOUNTER — Ambulatory Visit (INDEPENDENT_AMBULATORY_CARE_PROVIDER_SITE_OTHER): Payer: Medicare Other

## 2018-03-27 ENCOUNTER — Ambulatory Visit: Payer: Medicare Other | Admitting: Family Medicine

## 2018-03-27 VITALS — BP 120/80 | HR 70 | Temp 98.5°F | Wt 225.1 lb

## 2018-03-27 DIAGNOSIS — Q740 Other congenital malformations of upper limb(s), including shoulder girdle: Secondary | ICD-10-CM | POA: Diagnosis not present

## 2018-03-27 DIAGNOSIS — R350 Frequency of micturition: Secondary | ICD-10-CM | POA: Diagnosis not present

## 2018-03-27 DIAGNOSIS — R5383 Other fatigue: Secondary | ICD-10-CM

## 2018-03-27 DIAGNOSIS — M791 Myalgia, unspecified site: Secondary | ICD-10-CM

## 2018-03-27 DIAGNOSIS — M2550 Pain in unspecified joint: Secondary | ICD-10-CM | POA: Diagnosis not present

## 2018-03-27 LAB — POCT URINALYSIS DIPSTICK
Bilirubin, UA: NEGATIVE
Blood, UA: NEGATIVE
GLUCOSE UA: NEGATIVE
KETONES UA: NEGATIVE
Leukocytes, UA: NEGATIVE
Nitrite, UA: NEGATIVE
Protein, UA: NEGATIVE
SPEC GRAV UA: 1.01 (ref 1.010–1.025)
Urobilinogen, UA: 0.2 E.U./dL
pH, UA: 6 (ref 5.0–8.0)

## 2018-03-27 NOTE — Progress Notes (Signed)
Subjective:     Patient ID: Aaron Bradshaw, male   DOB: November 14, 1950, 67 y.o.   MRN: [id number]  HPI Patient seen for the following issues  Just noticed yesterday prominence of the left medial clavicle. Denies any injury. He frequently lifts weights and has not had any significant pain on very mild soreness with lifting. He does relate some "chest congestion". Rare cough. Mild sore throat past few days. No fevers or chills. No dyspnea.  Said some recent somewhat infrequent night sweats but good appetite and no weight changes. No recent skin rash. Does complain of some myalgias involving the neck and back and lower extremities as well as arthralgias mostly involving the hips. No upper extremity myalgias.  He has past history of transverse myelitis. He's had some intermittent sensory symptoms and leg since that several years ago but no recent weakness.  He does relate multiple tick bites through the past year but no associated rash.  Also complains of some urine frequency but no obstructive urinary symptoms. Rare episodes of mild burning. He does have type 2 diabetes but fairly well-controlled  Past Medical History:  Diagnosis Date  . Anxiety   . 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 Diabetes mellitus ([place]) 6/12   A1c 7.1 %   dx 4 yrs ago  . Gilbert syndrome   . Heart murmur    'when I was younger"  . History of chest pain    Hospitalized ER in [place] for CP and Palpitations with Neg Enzymes  . Hyperlipidemia   . Shingles    2015  . Testosterone deficiency    Dr Hartley Barefoot  . 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 CHOLECYSTECTOMY  2010  . COLONOSCOPY     Tics, Polyp 12/26/2003; polyps 2010, Dr Carlean Purl  . KNEE ARTHROSCOPY     Left  . LAPAROSCOPIC APPENDECTOMY N/A 10/05/2015   Procedure: APPENDECTOMY LAPAROSCOPIC;  Surgeon: Judeth Horn, MD;  Location: [facility];  Service: General;  Laterality: N/A;  . NOSE SURGERY     Dr  Truman Hayward   . ROTATOR CUFF REPAIR     left  . SHOULDER ARTHROSCOPY      reports that he has never smoked. He has never used smokeless tobacco. He reports that he drinks about 1.0 - 2.0 standard drinks of alcohol per week. He reports that he does not use drugs. family history includes Arthritis in his mother; Depression in his father, mother, and sister; Diabetes in his maternal grandfather and mother; Heart disease in his father; OCD in his father; Parkinsonism in his mother; Prostate cancer in his father and paternal grandfather; Stroke (age of onset: 84) in his mother. Allergies  Allergen Reactions  . Codeine Nausea Only  . Prozac [Fluoxetine Hcl] Anxiety    Increased anxiety     Review of Systems  Constitutional: Positive for fatigue. Negative for appetite change, chills, fever and unexpected weight change.  Respiratory: Negative for shortness of breath.   Cardiovascular: Negative for chest pain, palpitations and leg swelling.  Gastrointestinal: Negative for abdominal pain.  Genitourinary: Negative for frequency.  Musculoskeletal: Positive for arthralgias, back pain and myalgias.  Skin: Negative for rash.  Neurological: Negative for dizziness.  Hematological: Negative for adenopathy. Does not bruise/bleed easily.       Objective:   Physical Exam  Constitutional: He appears well-developed and well-nourished.  HENT:  Mouth/Throat: Oropharynx is clear and moist.  Neck: Neck supple.  Cardiovascular: Normal rate and regular rhythm.  Pulmonary/Chest: Effort normal and breath sounds normal.  Has some prominence of the left medial clavicle. Minimally tender. No warmth. No overlying rash. No erythema  Musculoskeletal: He exhibits no edema.  Lymphadenopathy:    He has no cervical adenopathy.       Assessment:     #1 prominence left medial clavicle. No reported injury.  #2 nonspecific complaints of increased fatigue and malaise  #3 frequent myalgias and arthralgias. Doubt polymyalgia  rheumatica    Plan:     -obtain chest x-ray and x-rays left clavicle -Check labs with CBC, comprehensive metabolic panel, TSH, sedimentation rate, ANA.  -urine dip normal.    Eulas Post MD [facility] at [facility]

## 2018-03-28 LAB — CBC WITH DIFFERENTIAL/PLATELET
BASOS ABS: 0.1 10*3/uL (ref 0.0–0.1)
Basophils Relative: 1.4 % (ref 0.0–3.0)
EOS PCT: 0.6 % (ref 0.0–5.0)
Eosinophils Absolute: 0 10*3/uL (ref 0.0–0.7)
HCT: 44.7 % (ref 39.0–52.0)
Hemoglobin: 15.8 g/dL (ref 13.0–17.0)
LYMPHS PCT: 26.5 % (ref 12.0–46.0)
Lymphs Abs: 2 10*3/uL (ref 0.7–4.0)
MCHC: 35.4 g/dL (ref 30.0–36.0)
MCV: 87.2 fl (ref 78.0–100.0)
MONO ABS: 0.6 10*3/uL (ref 0.1–1.0)
MONOS PCT: 8.3 % (ref 3.0–12.0)
NEUTROS PCT: 63.2 % (ref 43.0–77.0)
Neutro Abs: 4.7 10*3/uL (ref 1.4–7.7)
Platelets: 193 10*3/uL (ref 150.0–400.0)
RBC: 5.13 Mil/uL (ref 4.22–5.81)
RDW: 13.3 % (ref 11.5–15.5)
WBC: 7.5 10*3/uL (ref 4.0–10.5)

## 2018-03-28 LAB — COMPREHENSIVE METABOLIC PANEL
ALK PHOS: 62 U/L (ref 39–117)
ALT: 26 U/L (ref 0–53)
AST: 21 U/L (ref 0–37)
Albumin: 4.6 g/dL (ref 3.5–5.2)
BUN: 17 mg/dL (ref 6–23)
CO2: 30 mEq/L (ref 19–32)
Calcium: 9.6 mg/dL (ref 8.4–10.5)
Chloride: 99 mEq/L (ref 96–112)
Creatinine, Ser: 1.04 mg/dL (ref 0.40–1.50)
GFR: 75.85 mL/min (ref >60.00)
GLUCOSE: 156 mg/dL — AB (ref 70–99)
POTASSIUM: 4.1 meq/L (ref 3.5–5.1)
SODIUM: 137 meq/L (ref 135–145)
TOTAL PROTEIN: 7.4 g/dL (ref 6.0–8.3)
Total Bilirubin: 1.3 mg/dL — ABNORMAL HIGH (ref 0.2–1.2)

## 2018-03-28 LAB — SEDIMENTATION RATE: Sed Rate: 9 mm/hr (ref 0–20)

## 2018-03-28 LAB — TSH: TSH: 6.41 u[IU]/mL — ABNORMAL HIGH (ref 0.35–4.50)

## 2018-03-29 LAB — B. BURGDORFI ANTIBODIES: B burgdorferi Ab IgG+IgM: <0.9 index

## 2018-03-29 LAB — ANA: Anti Nuclear Antibody(ANA): NEGATIVE

## 2018-03-30 ENCOUNTER — Telehealth: Payer: Self-pay | Admitting: Family Medicine

## 2018-03-30 NOTE — Telephone Encounter (Unsigned)
Copied from [place] [id number]. Topic: General - Other >> Mar 30, 2018  4:12 PM Carolyn Stare wrote: Denny Peon with [facility] would like a call back with pt recent [id number] [phone]   ext [phone]

## 2018-04-09 ENCOUNTER — Ambulatory Visit (INDEPENDENT_AMBULATORY_CARE_PROVIDER_SITE_OTHER): Payer: Medicare Other | Admitting: Sports Medicine

## 2018-04-09 ENCOUNTER — Encounter: Payer: Self-pay | Admitting: Sports Medicine

## 2018-04-09 VITALS — BP 138/88 | Ht 75.0 in | Wt 220.0 lb

## 2018-04-09 DIAGNOSIS — M25512 Pain in left shoulder: Secondary | ICD-10-CM | POA: Diagnosis not present

## 2018-04-09 NOTE — Progress Notes (Signed)
   Subjective:    Patient ID: Aaron Bradshaw, male    DOB: [birth year], 67 y.o.   MRN: [id number]  HPI 67 year old male who presents for left-sided upper anterior chest swelling.  He states that for the last 2 weeks he has noticed intermittent dull pain and a "popped out" appearance of his medial clavicle.  He voiced concern about this with his PCP on 03/27/2018.  PCP obtained 2 view chest x-ray and left-sided clavicle x-ray.  Left-sided clavicle x-ray from 03/27/2018 showed no fracture or focal focal osseous lesion in the area of his pain. Patient states he has had no limitation of motion, and has had no decreased strength.  He has been able to complete exercises since he first noticed this.  He does a combination of weight training and swimming.  He has tried Tylenol for pain which has helped some.  He is not taking any ibuprofen or Aleve.  Patient denies any tingling, burning, numbness running down his left arm. No history of trauma.  Review of Systems Review of systems per HPI  Reviewed past medical history, social history, allergies, medications    Objective:   Physical Exam GEN: No acute distress, resting comfortably, Caucasian male Cards: Warm, well-perfused.  Palpable radial, ulnar pulse left upper extremity. Pulm: No increased work of breathing, no accessory muscle use Neuro: Intact sensation left upper extremity.  No focal neurologic deficits.  MSK:  Left clavicle Inspection: Medial third of clavicle asymmetric from right clavicle.  Obvious swelling at the [facility] joint.  Not erythematous.  Not warm to touch. Palpation: No tenderness to palpation over the left [place] joint.  Soft tissue swelling palpated around bony structure. Range of motion: Range of motion intact pectoral flexion, extension.  Rotator cuff range of motion intact to abduction, abduction, internal rotation, external rotation, flexion, extension. Neurovascular: Palpable ulnar and radial pulse left upper extremity.  Left  clavicle ultrasound performed in office.  Notable soft tissue swelling seen between sternum and clavicle.  Left clavicle x-ray from 03/27/2018 reviewed in office.  No acute fracture noted, incidental AC joint osteoarthritis noted.  Bedside ultrasound of the left [place] joint shows a moderate effusion.  No soft tissue mass.     Assessment & Plan:  67 year old male who presents for swelling in upper medial left chest.  Exam findings consistent with [place] joint inflammation.  Recommended ice and anti-inflammatory medication to patient.  Plan that if pain gets worse or the swelling becomes much worse can follow-up in clinic.  At that time would likely recommend decreasing overhead activity and resting.   [place] joint inflammation -Aleve as needed for pain -Ice 10-15 minutes after exercise  Guadalupe Dawn MD PGY-2 Family Medicine Resident  Patient seen and evaluated with the resident.  I agree with the above plan of care.  Patient has an effusion at the left [place] joint.  He likely has some mild underlying osteoarthritis.  He has no history of trauma.   Minimal discomfort.  It does not interfere with his activities of daily living or exercise.  I recommend over-the-counter anti-inflammatories and ice as needed.  No restrictions on activity.  Of note, his PCP did a panel of blood work earlier this month which was unremarkable.  If patient's symptoms worsen then consider cortisone injection.  Follow-up as needed.

## 2018-04-13 ENCOUNTER — Encounter: Payer: Self-pay | Admitting: Family Medicine

## 2018-04-13 ENCOUNTER — Ambulatory Visit: Payer: Medicare Other | Admitting: Family Medicine

## 2018-04-13 VITALS — BP 122/78 | HR 62 | Temp 97.5°F | Resp 15 | Ht 75.0 in | Wt 227.0 lb

## 2018-04-13 DIAGNOSIS — E119 Type 2 diabetes mellitus without complications: Secondary | ICD-10-CM | POA: Diagnosis not present

## 2018-04-13 DIAGNOSIS — Z23 Encounter for immunization: Secondary | ICD-10-CM | POA: Diagnosis not present

## 2018-04-13 DIAGNOSIS — E785 Hyperlipidemia, unspecified: Secondary | ICD-10-CM

## 2018-04-13 DIAGNOSIS — R7989 Other specified abnormal findings of blood chemistry: Secondary | ICD-10-CM | POA: Diagnosis not present

## 2018-04-13 LAB — POCT GLYCOSYLATED HEMOGLOBIN (HGB A1C): Hemoglobin A1C: 7.8 % — AB (ref 4.0–5.6)

## 2018-04-13 MED ORDER — LEVOTHYROXINE SODIUM 25 MCG PO TABS: 25.0000 ug | ORAL_TABLET | Freq: Every day | ORAL | 3 refills | Status: DC

## 2018-04-13 NOTE — Progress Notes (Signed)
Subjective:     Patient ID: Aaron Bradshaw, male   DOB: 31-Mar-1951, 67 y.o.   MRN: [id number]  HPI Here for medical follow-up  Type 2 diabetes. Last A1c 7.4%. He has been fairly compliant with diet and exercise. Remains on metformin extended release 750 mg twice daily. Previously took Amaryl but had some hypoglycemic symptoms and weakness. We've been trying to manage through lifestyle modification.  Hyperlipidemia treated with Crestor. Is overdue for lipids. Elevated TSH over 6 by recent labs. He does have some general fatigue issues. Occasional myalgias. Recent sedimentation rate normal.  Continues to exercise fairly regularly mostly with swimming  Past Medical History:  Diagnosis Date  . Anxiety   . 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 Diabetes mellitus ([place]) 6/12   A1c 7.1 %   dx 4 yrs ago  . Gilbert syndrome   . Heart murmur    'when I was younger"  . History of chest pain    Hospitalized ER in [place] for CP and Palpitations with Neg Enzymes  . Hyperlipidemia   . Shingles    2015  . Testosterone deficiency    Dr Hartley Barefoot  . 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 CHOLECYSTECTOMY  2010  . COLONOSCOPY     Tics, Polyp 12/26/2003; polyps 2010, Dr Carlean Purl  . KNEE ARTHROSCOPY     Left  . LAPAROSCOPIC APPENDECTOMY N/A 10/05/2015   Procedure: APPENDECTOMY LAPAROSCOPIC;  Surgeon: Judeth Horn, MD;  Location: [facility];  Service: General;  Laterality: N/A;  . NOSE SURGERY     Dr  Truman Hayward  . ROTATOR CUFF REPAIR     left  . SHOULDER ARTHROSCOPY      reports that he has never smoked. He has never used smokeless tobacco. He reports that he drinks about 1.0 - 2.0 standard drinks of alcohol per week. He reports that he does not use drugs. family history includes Arthritis in his mother; Depression in his father, mother, and sister; Diabetes in his maternal grandfather and mother; Heart disease in his father; OCD in his father;  Parkinsonism in his mother; Prostate cancer in his father and paternal grandfather; Stroke (age of onset: 66) in his mother. Allergies  Allergen Reactions  . Codeine Nausea Only  . Prozac [Fluoxetine Hcl] Anxiety    Increased anxiety     Review of Systems  Constitutional: Positive for fatigue.  Eyes: Negative for visual disturbance.  Respiratory: Negative for cough, chest tightness and shortness of breath.   Cardiovascular: Negative for chest pain, palpitations and leg swelling.  Neurological: Negative for dizziness, syncope, weakness, light-headedness and headaches.       Objective:   Physical Exam  Constitutional: He is oriented to person, place, and time. He appears well-developed and well-nourished.  HENT:  Right Ear: External ear normal.  Left Ear: External ear normal.  Mouth/Throat: Oropharynx is clear and moist.  Eyes: Pupils are equal, round, and reactive to light.  Neck: Neck supple. No thyromegaly present.  Cardiovascular: Normal rate and regular rhythm.  Pulmonary/Chest: Effort normal and breath sounds normal. No respiratory distress. He has no wheezes. He has no rales.  Musculoskeletal: He exhibits no edema.  Neurological: He is alert and oriented to person, place, and time.       Assessment:     #1 type 2 diabetes suboptimally controlled A1c 7.8%  #2 elevated TSH  #3 hyperlipidemia-on Crestor    Plan:     -  discussed options for diabetes management. We discussed possible addition of DPP 4 Inhibitor, SGLT 2 inhibitor, or GLP-1 medication. He requests 3 more months of lifestyle modification. Will set up diabetes education. If not to goal at follow-up consider one of the above classes. -Start levothyroxine 25 g once daily -We'll plan follow-up labs in 3 months including A1c, lipids, chemistries, TSH -Flu vaccine given  Eulas Post MD [facility]

## 2018-04-13 NOTE — Patient Instructions (Signed)
I am putting in referral for diabetes education  Go ahead and start the thyroid replacement medication  We will check thyroid and lipids at follow up.

## 2018-05-03 ENCOUNTER — Encounter: Payer: Medicare Other | Attending: Family Medicine | Admitting: Skilled Nursing Facility1

## 2018-05-03 DIAGNOSIS — Z713 Dietary counseling and surveillance: Secondary | ICD-10-CM | POA: Insufficient documentation

## 2018-05-03 DIAGNOSIS — E119 Type 2 diabetes mellitus without complications: Secondary | ICD-10-CM | POA: Diagnosis present

## 2018-05-03 NOTE — Progress Notes (Signed)
Pts A1C 7.8. Pts appt was interrupted by a fire alarm.  Pt main concern is his anxiety and stress. Pt states he does not sleep well at night and wakes up with anxiety. Pt states he is not interested in medications for this condition due to the side effects. Dietitian educated the pt on some individuals needing medications for this condition but also offered alternative methods of dealing with anxiety such as tai chi, meditation and deep breathing exercises. Pt states he has tried alternative methods in the past which he found beneficial and states he will try them again. Pt states he Stopped checking his blood sugar due to always having high blood sugars and feeling defeated like he could not do anything to change his blood sugars.Dietitian educated the pt on the importance of checking his blood sugars and how helpful it can be in managing his diabetes. Pt states he eats a a lot of butter. Dietitian educated the pt on the connection between saturated fat and blood sugar control. Pt states he sees his doctor often throughout the year and checks his feet daily. Pt states he currently struggles with circulation in his leg due to myolitis. Pt states he swims 60 minutes 5 days a week.  Pt is aware of hypoglycemia and how to treat the occurrence. Pt states he seems to always have high blood suagrs when he wakes up. Pt states he drinks whole milk because he heard it was better for blood sugar control.   24 hr recall Cereal sometimes with fruit and 1-2 toast with peanut butter and low sugar jelly 1-2 sandwiches [place] and american cheese or peanut butter and jelly with whole milk Apple or half sandwich Chicken or fish with vegetables or sweet potatoes   sometimes some more cereal   Beverages: whole milk, water, coffee, tea   Goals: -Check your blood sugars 2 times a day: fasting and 2 hours post prandial -Cook with olive oil instead of butter -Do 30 minutes of meditative practices before bed to help with  sleep -Come to diabetes support group -Keep up the great activity! experiment with your bed times snack to control your fasting blood sugar  -Switch from whole milk to 2% milk with the eventual goal of fat free milk

## 2018-07-05 ENCOUNTER — Other Ambulatory Visit: Payer: Self-pay | Admitting: Family Medicine

## 2018-07-13 ENCOUNTER — Ambulatory Visit: Payer: Medicare Other | Admitting: Family Medicine

## 2018-08-20 ENCOUNTER — Encounter: Payer: Self-pay | Admitting: Family Medicine

## 2018-08-20 ENCOUNTER — Ambulatory Visit: Payer: Medicare Other | Admitting: Family Medicine

## 2018-08-20 ENCOUNTER — Other Ambulatory Visit: Payer: Self-pay

## 2018-08-20 VITALS — BP 138/88 | HR 58 | Temp 97.6°F | Ht 75.0 in | Wt 221.1 lb

## 2018-08-20 DIAGNOSIS — E039 Hypothyroidism, unspecified: Secondary | ICD-10-CM | POA: Insufficient documentation

## 2018-08-20 DIAGNOSIS — R7989 Other specified abnormal findings of blood chemistry: Secondary | ICD-10-CM

## 2018-08-20 DIAGNOSIS — E785 Hyperlipidemia, unspecified: Secondary | ICD-10-CM | POA: Diagnosis not present

## 2018-08-20 DIAGNOSIS — E119 Type 2 diabetes mellitus without complications: Secondary | ICD-10-CM | POA: Diagnosis not present

## 2018-08-20 LAB — MICROALBUMIN / CREATININE URINE RATIO
Creatinine,U: 62.5 mg/dL
MICROALB/CREAT RATIO: 1.1 mg/g (ref 0.0–30.0)

## 2018-08-20 LAB — POCT GLYCOSYLATED HEMOGLOBIN (HGB A1C): Hemoglobin A1C: 7.8 % — AB (ref 4.0–5.6)

## 2018-08-20 LAB — TSH: TSH: 6.55 u[IU]/mL — AB (ref 0.35–4.50)

## 2018-08-20 MED ORDER — EMPAGLIFLOZIN 10 MG PO TABS: 10.0000 mg | ORAL_TABLET | Freq: Every day | ORAL | 3 refills | Status: DC

## 2018-08-20 NOTE — Progress Notes (Signed)
Subjective:     Patient ID: Aaron Bradshaw, male   DOB: [birth year], 68 y.o.   MRN: [id number]  HPI Patient is seen for medical follow-up  Type 2 diabetes.  Last A1c 7.8%.  He has been exercising more.  Weight is not much changed.  Remains on metformin.  Compliant with therapy.  No polyuria or polydipsia.  Recent initiation of Crestor.  He had to come off this about 3 weeks ago due to possible drug interaction with the drug that dermatologist prescribed.  He plans to go back on this next week.  He was tolerating Crestor without side effects  Recent elevated TSH 6.41.  We initiated low-dose levothyroxine 25 mcg daily and he is taking this consistently.  Past Medical History:  Diagnosis Date  . Anxiety   . 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 Diabetes mellitus ([place]) 6/12   A1c 7.1 %   dx 4 yrs ago  . Gilbert syndrome   . Heart murmur    'when I was younger"  . History of chest pain    Hospitalized ER in [place] for CP and Palpitations with Neg Enzymes  . Hyperlipidemia   . Shingles    2015  . Testosterone deficiency    Dr Hartley Barefoot  . 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 CHOLECYSTECTOMY  2010  . COLONOSCOPY     Tics, Polyp 12/26/2003; polyps 2010, Dr Carlean Purl  . KNEE ARTHROSCOPY     Left  . LAPAROSCOPIC APPENDECTOMY N/A 10/05/2015   Procedure: APPENDECTOMY LAPAROSCOPIC;  Surgeon: Judeth Horn, MD;  Location: [place];  Service: General;  Laterality: N/A;  . NOSE SURGERY     Dr  Truman Hayward  . ROTATOR CUFF REPAIR     left  . SHOULDER ARTHROSCOPY      reports that he has never smoked. He has never used smokeless tobacco. He reports current alcohol use of about 1.0 - 2.0 standard drinks of alcohol per week. He reports that he does not use drugs. family history includes Arthritis in his mother; Depression in his father, mother, and sister; Diabetes in his maternal grandfather and mother; Heart disease in his father; OCD in his  father; Parkinsonism in his mother; Prostate cancer in his father and paternal grandfather; Stroke (age of onset: 76) in his mother. Allergies  Allergen Reactions  . Codeine Nausea Only  . Prozac [Fluoxetine Hcl] Anxiety    Increased anxiety     Review of Systems  Constitutional: Negative for fatigue.  Eyes: Negative for visual disturbance.  Respiratory: Negative for cough, chest tightness and shortness of breath.   Cardiovascular: Negative for chest pain, palpitations and leg swelling.  Endocrine: Negative for polydipsia and polyuria.  Neurological: Negative for dizziness, syncope, weakness, light-headedness and headaches.       Objective:   Physical Exam Constitutional:      Appearance: He is well-developed.  HENT:     Right Ear: External ear normal.     Left Ear: External ear normal.  Eyes:     Pupils: Pupils are equal, round, and reactive to light.  Neck:     Musculoskeletal: Neck supple.     Thyroid: No thyromegaly.  Cardiovascular:     Rate and Rhythm: Normal rate and regular rhythm.  Pulmonary:     Effort: Pulmonary effort is normal. No respiratory distress.     Breath sounds: Normal breath sounds. No wheezing or rales.  Skin:  Comments: Feet reveal no skin lesions. Good distal foot pulses. Good capillary refill.  Only 1 small callus left fourth toe. Normal sensation with monofilament testing   Neurological:     Mental Status: He is alert and oriented to person, place, and time.        Assessment:     #1 type 2 diabetes suboptimally controlled with A1c 7.8%  #2 elevated TSH with recent initiation of levothyroxine  #3 hyperlipidemia treated with Crestor.  Patient off medication past 3 weeks    Plan:     -Check labs today with TSH and urine microalbumin.  We elected not to check lipids today since he has been off Crestor for 3 weeks  -Discussed other options for managing diabetes.  Start Jardiance 10 mg once daily.  Reviewed potential side  effects  -Reassess in 3 weeks and recheck A1c then along with lipid panel and hepatic panel  Eulas Post MD [facility] at [facility]

## 2018-08-22 ENCOUNTER — Other Ambulatory Visit: Payer: Self-pay

## 2018-08-22 MED ORDER — LEVOTHYROXINE SODIUM 50 MCG PO TABS: 50.0000 ug | ORAL_TABLET | Freq: Every day | ORAL | 3 refills | Status: DC

## 2018-10-03 ENCOUNTER — Encounter: Payer: Self-pay | Admitting: Internal Medicine

## 2018-11-19 ENCOUNTER — Ambulatory Visit (INDEPENDENT_AMBULATORY_CARE_PROVIDER_SITE_OTHER): Payer: Medicare Other | Admitting: Family Medicine

## 2018-11-19 ENCOUNTER — Other Ambulatory Visit: Payer: Self-pay

## 2018-11-19 ENCOUNTER — Ambulatory Visit: Payer: Medicare Other | Admitting: Family Medicine

## 2018-11-19 DIAGNOSIS — E785 Hyperlipidemia, unspecified: Secondary | ICD-10-CM | POA: Diagnosis not present

## 2018-11-19 DIAGNOSIS — E119 Type 2 diabetes mellitus without complications: Secondary | ICD-10-CM | POA: Diagnosis not present

## 2018-11-19 DIAGNOSIS — R7989 Other specified abnormal findings of blood chemistry: Secondary | ICD-10-CM

## 2018-11-19 NOTE — Progress Notes (Signed)
Patient ID: Aaron Bradshaw, male   DOB: May 01, 1951, 68 y.o.   MRN: [id number]  This visit type was conducted due to national recommendations for restrictions regarding the COVID-19 pandemic in an effort to limit this patient's exposure and mitigate transmission in our community.   Virtual Visit via Video Note  I connected with Aaron Bradshaw on 11/19/18 at 10:15 AM EDT by a video enabled telemedicine application and verified that I am speaking with the correct person using two identifiers.  Location patient: home Location provider:work or home office Persons participating in the virtual visit: patient, provider  I discussed the limitations of evaluation and management by telemedicine and the availability of in person appointments. The patient expressed understanding and agreed to proceed.   HPI: Patient has history of type 2 diabetes, hyperlipidemia, hypothyroidism.  Last TSH was 6.55.  We increased his levothyroxine to 50 mcg daily.  He is compliant with therapy and has been taking this correctly early in the morning on empty stomach.  Last A1c was 7.8% back in January.  We added Jardiance 10 mg daily.  He has lost some weight and is down to 210 pounds from 221 pounds.  His fasting blood sugars are still up somewhat with 160 this morning.  He is hoping that his A1c will be improved  He has hyperlipidemia.  Recently back on Crestor.  Needs follow-up lipids.  No myalgias.  No other specific complaints   ROS: See pertinent positives and negatives per HPI.  Past Medical History:  Diagnosis Date  . Anxiety   . 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 Diabetes mellitus ([place]) 6/12   A1c 7.1 %   dx 4 yrs ago  . Gilbert syndrome   . Heart murmur    'when I was younger"  . History of chest pain    Hospitalized ER in [place] for CP and Palpitations with Neg Enzymes  . Hyperlipidemia   . Shingles    2015  . Testosterone deficiency    Dr Hartley Barefoot  . Transverse myelitis ([place])  10/2013    Past Surgical History:  Procedure Laterality Date  . CHOLECYSTECTOMY  2010  . COLONOSCOPY     Tics, Polyp 12/26/2003; polyps 2010, Dr Carlean Purl  . KNEE ARTHROSCOPY     Left  . LAPAROSCOPIC APPENDECTOMY N/A 10/05/2015   Procedure: APPENDECTOMY LAPAROSCOPIC;  Surgeon: Judeth Horn, MD;  Location: [facility];  Service: General;  Laterality: N/A;  . NOSE SURGERY     Dr  Truman Hayward  . ROTATOR CUFF REPAIR     left  . SHOULDER ARTHROSCOPY      Family History  Problem Relation Age of Onset  . Depression Father   . Heart disease Father        MI in 77s  . Prostate cancer Father   . OCD Father   . Depression Mother   . Arthritis Mother   . Stroke Mother 62  . Parkinsonism Mother   . Diabetes Mother   . Depression Sister        OCD  . Prostate cancer Paternal Grandfather   . Diabetes Maternal Grandfather        MI in 30s  . Colon cancer Neg Hx     SOCIAL HX: Non-smoker.  Retired as a Airline pilot.  Married.  He has 3 children and several grandchildren.  Exercises regularly.   Current Outpatient Medications:  .  Ascorbic Acid (VITAMIN C) 250 MG tablet, Take 250 mg  by mouth daily.  , Disp: , Rfl:  .  aspirin 81 MG tablet, Take 81 mg by mouth daily.  , Disp: , Rfl:  .  b complex vitamins capsule, Take 1 capsule by mouth daily.  , Disp: , Rfl:  .  CHOLECALCIFEROL PO, Take 1 tablet by mouth daily., Disp: , Rfl:  .  co-enzyme Q-10 30 MG capsule, Take 30 mg by mouth daily., Disp: , Rfl:  .  empagliflozin (JARDIANCE) 10 MG TABS tablet, Take 10 mg by mouth daily., Disp: 90 tablet, Rfl: 3 .  EPIPEN 2-PAK 0.3 MG/0.3ML SOAJ injection, USE AS DIRECTED AS NEEDED, Disp: , Rfl: 1 .  glucose blood (ONE TOUCH ULTRA TEST) test strip, Use once daily.  Dx E11.9, Disp: 100 each, Rfl: 3 .  imiquimod (ALDARA) 5 % cream, APPLY 1 APPLICATION APPLY ON THE SKIN AS DIRECTED APPLY EVERY OTHER NIGHT FOR 4 WEEKS., Disp: , Rfl:  .  KRILL OIL PO, Take 1 capsule by mouth daily., Disp: , Rfl:  .  levothyroxine  (SYNTHROID, LEVOTHROID) 50 MCG tablet, Take 1 tablet (50 mcg total) by mouth daily before breakfast., Disp: 90 tablet, Rfl: 3 .  MAGNESIUM PO, Take 1 tablet by mouth daily., Disp: , Rfl:  .  metFORMIN (GLUCOPHAGE-XR) 750 MG 24 hr tablet, TAKE 1 TABLET BY MOUTH TWICE A DAY NEEDS OFFICE VISIT, Disp: 180 tablet, Rfl: 1 .  Multiple Vitamin (MULTIVITAMIN) tablet, Take 1 tablet by mouth daily.  , Disp: , Rfl:  .  PRESCRIPTION MEDICATION, Inject 1 each into the skin every 7 (seven) days. Patient gets an allergy shot once a week at [employer] off of [address], Disp: , Rfl:  .  rosuvastatin (CRESTOR) 10 MG tablet, Take 1 tablet (10 mg total) by mouth daily., Disp: 90 tablet, Rfl: 3 .  Turmeric 500 MG CAPS, Take 1,000 mg by mouth daily., Disp: , Rfl:   EXAM:  VITALS per patient if applicable:  GENERAL: alert, oriented, appears well and in no acute distress  HEENT: atraumatic, conjunttiva clear, no obvious abnormalities on inspection of external nose and ears  NECK: normal movements of the head and neck  LUNGS: on inspection no signs of respiratory distress, breathing rate appears normal, no obvious gross SOB, gasping or wheezing  CV: no obvious cyanosis  MS: moves all visible extremities without noticeable abnormality  PSYCH/NEURO: pleasant and cooperative, no obvious depression or anxiety, speech and thought processing grossly intact  ASSESSMENT AND PLAN:  Discussed the following assessment and plan:  #1 type 2 diabetes.  History of poor control with last A1c 7.8%.  Recent addition of Jardiance and tolerating well -Recheck A1c  #2 dyslipidemia -Recheck labs tomorrow morning fasting with lipid and hepatic panel  #3 elevated TSH.  Patient on levothyroxine 50 mcg daily -Recheck TSH tomorrow morning     I discussed the assessment and treatment plan with the patient. The patient was provided an opportunity to ask questions and all were answered. The patient agreed with the plan and  demonstrated an understanding of the instructions.   The patient was advised to call back or seek an in-person evaluation if the symptoms worsen or if the condition fails to improve as anticipated.   Carolann Littler, MD

## 2018-11-20 LAB — POCT GLYCOSYLATED HEMOGLOBIN (HGB A1C): HbA1c, POC (controlled diabetic range): 7.3 % — AB (ref 0.0–7.0)

## 2018-11-20 LAB — BASIC METABOLIC PANEL
BUN: 19 mg/dL (ref 6–23)
CO2: 30 mEq/L (ref 19–32)
Calcium: 8.6 mg/dL (ref 8.4–10.5)
Chloride: 102 mEq/L (ref 96–112)
Creatinine, Ser: 0.88 mg/dL (ref 0.40–1.50)
GFR: 86.36 mL/min (ref >60.00)
Glucose, Bld: 157 mg/dL — ABNORMAL HIGH (ref 70–99)
Potassium: 4.1 mEq/L (ref 3.5–5.1)
Sodium: 138 mEq/L (ref 135–145)

## 2018-11-20 LAB — HEPATIC FUNCTION PANEL
ALT: 17 U/L (ref 0–53)
AST: 16 U/L (ref 0–37)
Albumin: 4.1 g/dL (ref 3.5–5.2)
Alkaline Phosphatase: 62 U/L (ref 39–117)
Bilirubin, Direct: 0.2 mg/dL (ref 0.0–0.3)
Total Bilirubin: 1.3 mg/dL — ABNORMAL HIGH (ref 0.2–1.2)
Total Protein: 6.6 g/dL (ref 6.0–8.3)

## 2018-11-20 LAB — TSH: TSH: 2.95 u[IU]/mL (ref 0.35–4.50)

## 2018-11-20 LAB — LIPID PANEL
Cholesterol: 102 mg/dL (ref 0–200)
HDL: 31.8 mg/dL — ABNORMAL LOW (ref >39.00)
LDL Cholesterol: 48 mg/dL (ref 0–99)
NonHDL: 70.32
Total CHOL/HDL Ratio: 3
Triglycerides: 111 mg/dL (ref 0.0–149.0)
VLDL: 22.2 mg/dL (ref 0.0–40.0)

## 2018-11-20 NOTE — Addendum Note (Signed)
Addended by: Elmer Picker on: 11/20/2018 08:05 AM   Modules accepted: Orders

## 2018-12-04 ENCOUNTER — Other Ambulatory Visit: Payer: Self-pay

## 2018-12-04 ENCOUNTER — Encounter: Payer: Self-pay | Admitting: Family Medicine

## 2018-12-04 ENCOUNTER — Ambulatory Visit: Payer: Medicare Other

## 2018-12-04 ENCOUNTER — Ambulatory Visit: Payer: Medicare Other | Admitting: Family Medicine

## 2018-12-04 VITALS — BP 118/82 | HR 57 | Temp 97.5°F | Ht 75.5 in | Wt 211.7 lb

## 2018-12-04 DIAGNOSIS — Z0000 Encounter for general adult medical examination without abnormal findings: Secondary | ICD-10-CM | POA: Diagnosis not present

## 2018-12-04 NOTE — Patient Instructions (Signed)
Set up repeat colonoscopy  Consider Shingrix vaccine- may want to check at pharmacy  Set up repeat eye exam

## 2018-12-04 NOTE — Progress Notes (Signed)
Subjective:     Patient ID: Aaron Bradshaw, male   DOB: 09/08/1950, 68 y.o.   MRN: [id number]  HPI Patient is seen for physical exam.  He has type 2 diabetes, history of transverse myelitis, history of recurrent prostatitis, hyperlipidemia.  He is followed regularly by dermatology and had recent checkup.  History of adenomatous colon polyps.  He is due for repeat colonoscopy now which has been delayed because of current pandemic.  Still exercising regularly at home.  He used to swim and has had to offset that by more walking and weightlifting.  Recent addition of Jardiance 2 diabetes medications.  A1c down to 7.3%.  He had previous Zostavax.  He would like to consider Shingrix.  Health maintenance:  -Gets yearly flu vaccine -History of Zostavax.  No Shingrix vaccine -Colonoscopy January 2017 with recommended 3-year follow-up -Tetanus due 6/28 -Pneumonia vaccines complete -Needs follow-up eye exam  Past Medical History:  Diagnosis Date  . Anxiety   . 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 Diabetes mellitus ([place]) 6/12   A1c 7.1 %   dx 4 yrs ago  . Gilbert syndrome   . Heart murmur    'when I was younger"  . History of chest pain    Hospitalized ER in [place] for CP and Palpitations with Neg Enzymes  . Hyperlipidemia   . Shingles    2015  . Testosterone deficiency    Dr Hartley Barefoot  . Transverse myelitis ([place]) 10/2013   Past Surgical History:  Procedure Laterality Date  . CHOLECYSTECTOMY  2010  . COLONOSCOPY     Tics, Polyp 12/26/2003; polyps 2010, Dr Carlean Purl  . KNEE ARTHROSCOPY     Left  . LAPAROSCOPIC APPENDECTOMY N/A 10/05/2015   Procedure: APPENDECTOMY LAPAROSCOPIC;  Surgeon: Judeth Horn, MD;  Location: [facility];  Service: General;  Laterality: N/A;  . NOSE SURGERY     Dr  Truman Hayward  . ROTATOR CUFF REPAIR     left  . SHOULDER ARTHROSCOPY      reports that he has never smoked. He has never used smokeless tobacco. He reports current alcohol use of about 1.0 - 2.0 standard  drinks of alcohol per week. He reports that he does not use drugs. family history includes Arthritis in his mother; Depression in his father, mother, and sister; Diabetes in his maternal grandfather and mother; Heart disease in his father; OCD in his father; Parkinsonism in his mother; Prostate cancer in his father and paternal grandfather; Stroke (age of onset: 45) in his mother. Allergies  Allergen Reactions  . Codeine Nausea Only  . Prozac [Fluoxetine Hcl] Anxiety    Increased anxiety     Review of Systems  Constitutional: Negative for activity change, appetite change, fatigue and fever.  HENT: Negative for congestion, ear pain and trouble swallowing.   Eyes: Negative for pain and visual disturbance.  Respiratory: Negative for cough, shortness of breath and wheezing.   Cardiovascular: Negative for chest pain and palpitations.  Gastrointestinal: Negative for abdominal distention, abdominal pain, blood in stool, constipation, diarrhea, nausea, rectal pain and vomiting.  Endocrine: Negative for polydipsia and polyuria.  Genitourinary: Negative for dysuria, hematuria and testicular pain.  Musculoskeletal: Negative for arthralgias and joint swelling.  Skin: Negative for rash.  Neurological: Negative for dizziness, syncope and headaches.  Hematological: Negative for adenopathy.  Psychiatric/Behavioral: Negative for confusion and dysphoric mood.       Objective:   Physical Exam Constitutional:      General:  He is not in acute distress.    Appearance: He is well-developed.  HENT:     Head: Normocephalic and atraumatic.     Right Ear: External ear normal.     Left Ear: External ear normal.  Eyes:     Conjunctiva/sclera: Conjunctivae normal.     Pupils: Pupils are equal, round, and reactive to light.  Neck:     Musculoskeletal: Normal range of motion and neck supple.     Thyroid: No thyromegaly.  Cardiovascular:     Rate and Rhythm: Normal rate and regular rhythm.     Heart  sounds: Normal heart sounds. No murmur.  Pulmonary:     Effort: No respiratory distress.     Breath sounds: No wheezing or rales.  Abdominal:     General: Bowel sounds are normal. There is no distension.     Palpations: Abdomen is soft. There is no mass.     Tenderness: There is no abdominal tenderness. There is no guarding or rebound.  Lymphadenopathy:     Cervical: No cervical adenopathy.  Skin:    Findings: No rash.  Neurological:     Mental Status: He is alert and oriented to person, place, and time.     Cranial Nerves: No cranial nerve deficit.     Deep Tendon Reflexes: Reflexes normal.        Assessment:     Physical exam.  Patient has type 2 diabetes with improving control.  We discussed the following health maintenance issues    Plan:     -Set up repeat eye exam -Discussed Shingrix vaccine and he will check at pharmacy -Patient will call GI to set up repeat colonoscopy -He has had some intermittent abdominal cramping and occasional constipation.  He thinks this may be related to metformin.  He is requesting reducing this to once daily.  Will reassess A1c at 96-month follow-up -Continue regular exercise habits -Continue with yearly flu vaccine  Eulas Post MD [facility] at [facility]

## 2018-12-25 ENCOUNTER — Encounter: Payer: Self-pay | Admitting: Internal Medicine

## 2019-01-08 ENCOUNTER — Other Ambulatory Visit: Payer: Self-pay

## 2019-01-08 ENCOUNTER — Ambulatory Visit: Payer: Medicare Other | Admitting: *Deleted

## 2019-01-08 VITALS — Ht 75.0 in | Wt 215.0 lb

## 2019-01-08 DIAGNOSIS — Z86010 Personal history of colonic polyps: Secondary | ICD-10-CM

## 2019-01-08 NOTE — Progress Notes (Signed)
No egg or soy allergy known to patient  No issues with past sedation with any surgeries  or procedures, no intubation problems  No diet pills per patient No home 02 use per patient  No blood thinners per patient  Pt denies issues with constipation currently- occ has an issue and probiotic prn  No A fib or A flutter  EMMI video sent to pt's e mail    Pt verified name, DOB, address and insurance during PV today. Pt mailed instruction packet to included paper to complete and mail back to [facility] with addressed and stamped envelope, Emmi video, copy of consent form to read and not return, and instructions.  PV completed over the phone. Pt encouraged to call with questions or issues    Pt is aware that care partner will wait in the car during parking lot; if they feel like they will be too hot to wait in the car; they may wait in the lobby.  We want them to wear a mask (we do not have any that we can provide them), practice social distancing, and we will check their temperatures when they get here.  I did remind patient that their care partner needs to stay in the parking lot the entire time. Pt will wear mask into building

## 2019-01-21 ENCOUNTER — Telehealth: Payer: Self-pay | Admitting: Internal Medicine

## 2019-01-21 NOTE — Telephone Encounter (Signed)
Spoke with patient regarding Covid-19 screening questions °Covid-19 Screening Questions ° °Do you now or have you had a fever in the last 14 days?     No ° °Do you have any respiratory symptoms of shortness of breath or cough now or in the last 14 days?    No ° °Do you have any family members or close contacts with diagnosed or suspected Covid-19 in the past 14 days?     No ° °Have you been tested for Covid-19 and found to be positive?    No ° °Pt made aware of that care partner may wait in the car or come up to the lobby during the procedure but will need to provide their own mask. ° °  °

## 2019-01-22 ENCOUNTER — Ambulatory Visit (AMBULATORY_SURGERY_CENTER): Payer: Medicare Other | Admitting: Internal Medicine

## 2019-01-22 ENCOUNTER — Other Ambulatory Visit: Payer: Self-pay

## 2019-01-22 ENCOUNTER — Encounter: Payer: Self-pay | Admitting: Internal Medicine

## 2019-01-22 VITALS — BP 112/70 | HR 49 | Temp 97.6°F | Resp 12 | Ht 75.0 in | Wt 215.0 lb

## 2019-01-22 DIAGNOSIS — Z86010 Personal history of colonic polyps: Secondary | ICD-10-CM | POA: Diagnosis not present

## 2019-01-22 MED ORDER — SODIUM CHLORIDE 0.9 % IV SOLN: 500.0000 mL | Freq: Once | INTRAVENOUS | Status: DC

## 2019-01-22 NOTE — Progress Notes (Signed)
Report to PACU, RN, vss, BBS= Clear.  

## 2019-01-22 NOTE — Op Note (Signed)
[facility] Patient Name: Aaron Bradshaw Procedure Date: 01/22/2019 8:44 AM MRN: [id number] Endoscopist: Gatha Mayer , MD Age: 68 Referring MD:  Date of Birth: Jan 24, 1951 Gender: Male Account #: [id number] Procedure:                Colonoscopy Indications:              Surveillance: Personal history of adenomatous                            polyps on last colonoscopy 3 years ago Medicines:                Propofol per Anesthesia, Monitored Anesthesia Care Procedure:                Pre-Anesthesia Assessment:                           - Prior to the procedure, a History and Physical                            was performed, and patient medications and                            allergies were reviewed. The patient's tolerance of                            previous anesthesia was also reviewed. The risks                            and benefits of the procedure and the sedation                            options and risks were discussed with the patient.                            All questions were answered, and informed consent                            was obtained. Prior Anticoagulants: The patient has                            taken no previous anticoagulant or antiplatelet                            agents. ASA Grade Assessment: II - A patient with                            mild systemic disease. After reviewing the risks                            and benefits, the patient was deemed in                            satisfactory condition to undergo the procedure.  After obtaining informed consent, the colonoscope                            was passed under direct vision. Throughout the                            procedure, the patient's blood pressure, pulse, and                            oxygen saturations were monitored continuously. The                            Colonoscope was introduced through the anus and   advanced to the the cecum, identified by                            appendiceal orifice and ileocecal valve. The                            colonoscopy was performed without difficulty. The                            patient tolerated the procedure well. The quality                            of the bowel preparation was excellent. The                            ileocecal valve, appendiceal orifice, and rectum                            were photographed. The bowel preparation used was                            Miralax via split dose instruction. Scope In: 8:51:27 AM Scope Out: 9:05:12 AM Scope Withdrawal Time: 0 hours 11 minutes 24 seconds  Total Procedure Duration: 0 hours 13 minutes 45 seconds  Findings:                 The perianal and digital rectal examinations were                            normal. Pertinent negatives include normal prostate                            (size, shape, and consistency).                           Many small and large-mouthed diverticula were found                            in the left colon.                           The exam was otherwise without abnormality on  direct and retroflexion views. Complications:            No immediate complications. Estimated Blood Loss:     Estimated blood loss: none. Impression:               - Diverticulosis in the left colon.                           - The examination was otherwise normal on direct                            and retroflexion views.                           - No specimens collected.                           - Personal history of colonic polyps. 3 adenomas                            and an ssp in past (required appy) Recommendation:           - Patient has a contact number available for                            emergencies. The signs and symptoms of potential                            delayed complications were discussed with the                            patient.  Return to normal activities tomorrow.                            Written discharge instructions were provided to the                            patient.                           - Resume previous diet.                           - Continue present medications.                           - Repeat colonoscopy in 5 years for surveillance. Gatha Mayer, MD 01/22/2019 9:12:58 AM This report has been signed electronically.

## 2019-01-22 NOTE — Progress Notes (Signed)
Pt's states no medical or surgical changes since previsit or office visit.  Temp CW Vitals JB 

## 2019-01-22 NOTE — Patient Instructions (Signed)
YOU HAD AN ENDOSCOPIC PROCEDURE TODAY AT [facility]:   Refer to the procedure report that was given to you for any specific questions about what was found during the examination.  If the procedure report does not answer your questions, please call your gastroenterologist to clarify.  If you requested that your care partner not be given the details of your procedure findings, then the procedure report has been included in a sealed envelope for you to review at your convenience later.  YOU SHOULD EXPECT: Some feelings of bloating in the abdomen. Passage of more gas than usual.  Walking can help get rid of the air that was put into your GI tract during the procedure and reduce the bloating. If you had a lower endoscopy (such as a colonoscopy or flexible sigmoidoscopy) you may notice spotting of blood in your stool or on the toilet paper. If you underwent a bowel prep for your procedure, you may not have a normal bowel movement for a few days.  Please Note:  You might notice some irritation and congestion in your nose or some drainage.  This is from the oxygen used during your procedure.  There is no need for concern and it should clear up in a day or so.  SYMPTOMS TO REPORT IMMEDIATELY:   Following lower endoscopy (colonoscopy or flexible sigmoidoscopy):  Excessive amounts of blood in the stool  Significant tenderness or worsening of abdominal pains  Swelling of the abdomen that is new, acute  Fever of 100F or higher   Following upper endoscopy (EGD)  Vomiting of blood or coffee ground material  New chest pain or pain under the shoulder blades  Painful or persistently difficult swallowing  New shortness of breath  Fever of 100F or higher  Black, tarry-looking stools  For urgent or emergent issues, a gastroenterologist can be reached at any hour by calling [phone].   DIET:  We do recommend a small meal at first, but then you may proceed to your regular diet.  Drink  plenty of fluids but you should avoid alcoholic beverages for 24 hours.  ACTIVITY:  You should plan to take it easy for the rest of today and you should NOT DRIVE or use heavy machinery until tomorrow (because of the sedation medicines used during the test).    FOLLOW UP: Our staff will call the number listed on your records 48-72 hours following your procedure to check on you and address any questions or concerns that you may have regarding the information given to you following your procedure. If we do not reach you, we will leave a message.  We will attempt to reach you two times.  During this call, we will ask if you have developed any symptoms of COVID 19. If you develop any symptoms (ie: fever, flu-like symptoms, shortness of breath, cough etc.) before then, please call [phone].  If you test positive for Covid 19 in the 2 weeks post procedure, please call and report this information to [place].    If any biopsies were taken you will be contacted by phone or by letter within the next 1-3 weeks.  Please call us at [phone] if you have not heard about the biopsies in 3 weeks.    Repeat next screening Colonoscopy in 5 years Diverticulosis (handout given)  SIGNATURES/CONFIDENTIALITY: You and/or your care partner have signed paperwork which will be entered into your electronic medical record.  These signatures attest to the fact that that the information  above on your After Visit Summary has been reviewed and is understood.  Full responsibility of the confidentiality of this discharge information lies with you and/or your care-partner. 

## 2019-01-24 ENCOUNTER — Telehealth: Payer: Self-pay

## 2019-01-24 NOTE — Telephone Encounter (Signed)
  Follow up Call-  Call back number 01/22/2019  Post procedure Call Back phone  # [phone]  Permission to leave phone message Yes  Some recent data might be hidden     Patient questions:  Do you have a fever, pain , or abdominal swelling? No. Pain Score  0 *  Have you tolerated food without any problems? Yes.    Have you been able to return to your normal activities? Yes.    Do you have any questions about your discharge instructions: Diet   No. Medications  No. Follow up visit  No.  Do you have questions or concerns about your Care? No.  Actions: * If pain score is 4 or above: No action needed, pain <4.  1. Have you developed a fever since your procedure? no  2.   Have you had an respiratory symptoms (SOB or cough) since your procedure? no  3.   Have you tested positive for COVID 19 since your procedure no  4.   Have you had any family members/close contacts diagnosed with the COVID 19 since your procedure?  no   If yes to any of these questions please route to Joylene John, RN and Alphonsa Gin, Therapist, sports.

## 2019-02-09 ENCOUNTER — Other Ambulatory Visit: Payer: Self-pay | Admitting: Family Medicine

## 2019-03-06 ENCOUNTER — Encounter: Payer: Self-pay | Admitting: Family Medicine

## 2019-03-06 ENCOUNTER — Ambulatory Visit: Payer: Medicare Other | Admitting: Family Medicine

## 2019-03-06 ENCOUNTER — Other Ambulatory Visit: Payer: Self-pay

## 2019-03-06 VITALS — BP 130/70 | HR 86 | Temp 98.0°F | Ht 75.5 in | Wt 217.8 lb

## 2019-03-06 DIAGNOSIS — E785 Hyperlipidemia, unspecified: Secondary | ICD-10-CM

## 2019-03-06 DIAGNOSIS — E039 Hypothyroidism, unspecified: Secondary | ICD-10-CM

## 2019-03-06 DIAGNOSIS — E119 Type 2 diabetes mellitus without complications: Secondary | ICD-10-CM | POA: Diagnosis not present

## 2019-03-06 LAB — POCT GLYCOSYLATED HEMOGLOBIN (HGB A1C): Hemoglobin A1C: 8.4 % — AB (ref 4.0–5.6)

## 2019-03-06 NOTE — Progress Notes (Signed)
Subjective:     Patient ID: Aaron Bradshaw, male   DOB: [birth year], 68 y.o.   MRN: [id number]  HPI Patient is seen for medical follow-up.  He has type 2 diabetes, hypothyroidism, dyslipidemia.  He has not been exercising as much over the past few months because of gym closure.  He did just recently last week joined [employer] and plans to start swimming again.  Has been doing some walking and some light weightlifting at home.  His last A1c was 7.3%.  He has not been taking his Jardiance regularly.  He has been taking metformin regularly.  Not monitoring blood sugars regularly.  Recent lipids were improved on Crestor.  He has had some occasional muscle aches and wonders if this may be related.  Those symptoms are inconsistent.  He had some recent plantar fascia issues.  He has hypothyroidism treated with low-dose levothyroxine.  Recent TSH at goal.  Past Medical History:  Diagnosis Date  . Allergy    allergy shots every 3 weeks   . Anxiety    past hx   . 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 Diabetes mellitus ([place]) 6/12   A1c 7.1 %   dx 4 yrs ago  . Gilbert syndrome   . Heart murmur    'when I was younger"  . History of chest pain    Hospitalized ER in [place] for CP and Palpitations with Neg Enzymes  . Hyperlipidemia   . Shingles    2015  . Testosterone deficiency    Dr Hartley Barefoot  . Thyroid disease   . Transverse myelitis ([place]) 10/2013   Past Surgical History:  Procedure Laterality Date  . APPENDECTOMY    . CHOLECYSTECTOMY  2010  . COLONOSCOPY     Tics, Polyp 12/26/2003; polyps 2010, Dr Carlean Purl  . KNEE ARTHROSCOPY     Left  . LAPAROSCOPIC APPENDECTOMY N/A 10/05/2015   Procedure: APPENDECTOMY LAPAROSCOPIC;  Surgeon: Judeth Horn, MD;  Location: [facility];  Service: General;  Laterality: N/A;  . NOSE SURGERY     Dr  Truman Hayward  . POLYPECTOMY    . ROTATOR CUFF REPAIR Bilateral    left  . SHOULDER ARTHROSCOPY      reports that he has never smoked. He has never used smokeless  tobacco. He reports current alcohol use of about 1.0 - 2.0 standard drinks of alcohol per week. He reports that he does not use drugs. family history includes Arthritis in his mother; Depression in his father, mother, and sister; Diabetes in his maternal grandfather and mother; Heart disease in his father; OCD in his father; Parkinsonism in his mother; Prostate cancer in his father and paternal grandfather; Stroke (age of onset: 58) in his mother. Allergies  Allergen Reactions  . Codeine Nausea Only  . Prozac [Fluoxetine Hcl] Anxiety    Increased anxiety     Review of Systems  Constitutional: Negative for appetite change, fatigue and unexpected weight change.  Eyes: Negative for visual disturbance.  Respiratory: Negative for cough, chest tightness and shortness of breath.   Cardiovascular: Negative for chest pain, palpitations and leg swelling.  Gastrointestinal: Negative for abdominal pain.  Neurological: Negative for dizziness, syncope, weakness, light-headedness and headaches.       Objective:   Physical Exam Constitutional:      Appearance: Normal appearance.  Neck:     Musculoskeletal: Neck supple.  Cardiovascular:     Rate and Rhythm: Normal rate and regular rhythm.  Pulmonary:  Effort: Pulmonary effort is normal.     Breath sounds: Normal breath sounds.  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  Neurological:     Mental Status: He is alert.        Assessment:     #1 type 2 diabetes poorly controlled with A1c today 8.4%.  Decreased compliance recently with Jardiance and exercise  #2 hyperlipidemia improved with Crestor  #3 hypothyroidism replaced on levothyroxine    Plan:     -Discussed multiple options for diabetes management.  He prefers to step up exercise compliance and get back on regular Jardiance and recheck in 3 months.  If not to goal at that point consider either  addition of DPP 4 inhibitor such as Januvia versus GLP-1 class. -29-month follow-up -Continue yearly eye exam  Eulas Post MD [facility] at [facility]

## 2019-03-06 NOTE — Patient Instructions (Signed)
Get back on the daily Jardiance  Step up consistency with exercise and diet  Let's plan on 3 month follow up.  If not closer to goal by then we will need to look at additional medications.

## 2019-04-08 LAB — HM DIABETES EYE EXAM

## 2019-04-23 ENCOUNTER — Other Ambulatory Visit: Payer: Self-pay | Admitting: Family Medicine

## 2019-05-09 ENCOUNTER — Other Ambulatory Visit: Payer: Self-pay | Admitting: Family Medicine

## 2019-06-07 ENCOUNTER — Other Ambulatory Visit: Payer: Self-pay

## 2019-06-07 ENCOUNTER — Ambulatory Visit: Payer: Medicare Other | Admitting: Family Medicine

## 2019-06-07 ENCOUNTER — Encounter: Payer: Self-pay | Admitting: Family Medicine

## 2019-06-07 VITALS — BP 118/82 | HR 54 | Temp 98.3°F | Ht 75.0 in | Wt 215.3 lb

## 2019-06-07 DIAGNOSIS — E119 Type 2 diabetes mellitus without complications: Secondary | ICD-10-CM

## 2019-06-07 LAB — POCT GLYCOSYLATED HEMOGLOBIN (HGB A1C): Hemoglobin A1C: 7.5 % — AB (ref 4.0–5.6)

## 2019-06-07 NOTE — Progress Notes (Signed)
Subjective:     Patient ID: Aaron Bradshaw, male   DOB: June 18, 1951, 68 y.o.   MRN: [id number]  HPI Follow-up type 2 diabetes.  Last A1c 8.4%.  We discussed possible addition of Januvia or GLP-1 medication but he preferred lifestyle management.  He is back swimming 3 days a week.  He has lost 10 pounds.  Remains on Metformin and is now taking Jardiance consistently.  No polyuria or polydipsia.  He had eye exam back in August which was reportedly normal.  Past Medical History:  Diagnosis Date  . Allergy    allergy shots every 3 weeks   . Anxiety    past hx   . 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 Diabetes mellitus ([place]) 6/12   A1c 7.1 %   dx 4 yrs ago  . Gilbert syndrome   . Heart murmur    'when I was younger"  . History of chest pain    Hospitalized ER in [place] for CP and Palpitations with Neg Enzymes  . Hyperlipidemia   . Shingles    2015  . Testosterone deficiency    Dr Hartley Barefoot  . Thyroid disease   . Transverse myelitis ([place]) 10/2013   Past Surgical History:  Procedure Laterality Date  . APPENDECTOMY    . CHOLECYSTECTOMY  2010  . COLONOSCOPY     Tics, Polyp 12/26/2003; polyps 2010, Dr Carlean Purl  . KNEE ARTHROSCOPY     Left  . LAPAROSCOPIC APPENDECTOMY N/A 10/05/2015   Procedure: APPENDECTOMY LAPAROSCOPIC;  Surgeon: Judeth Horn, MD;  Location: [facility];  Service: General;  Laterality: N/A;  . NOSE SURGERY     Dr  Truman Hayward  . POLYPECTOMY    . ROTATOR CUFF REPAIR Bilateral    left  . SHOULDER ARTHROSCOPY      reports that he has never smoked. He has never used smokeless tobacco. He reports current alcohol use of about 1.0 - 2.0 standard drinks of alcohol per week. He reports that he does not use drugs. family history includes Arthritis in his mother; Depression in his father, mother, and sister; Diabetes in his maternal grandfather and mother; Heart disease in his father; OCD in his father; Parkinsonism in his mother; Prostate cancer in his father and paternal  grandfather; Stroke (age of onset: 20) in his mother. Allergies  Allergen Reactions  . Codeine Nausea Only  . Prozac [Fluoxetine Hcl] Anxiety    Increased anxiety     Review of Systems  Constitutional: Negative for fatigue.  Eyes: Negative for visual disturbance.  Respiratory: Negative for cough, chest tightness and shortness of breath.   Cardiovascular: Negative for chest pain, palpitations and leg swelling.  Neurological: Negative for dizziness, syncope, weakness, light-headedness and headaches.       Objective:   Physical Exam Constitutional:      Appearance: He is well-developed.  HENT:     Right Ear: External ear normal.     Left Ear: External ear normal.  Eyes:     Pupils: Pupils are equal, round, and reactive to light.  Neck:     Musculoskeletal: Neck supple.     Thyroid: No thyromegaly.  Cardiovascular:     Rate and Rhythm: Normal rate and regular rhythm.  Pulmonary:     Effort: Pulmonary effort is normal. No respiratory distress.     Breath sounds: Normal breath sounds. No wheezing or rales.  Skin:    Comments: Feet reveal no skin lesions. Good distal foot pulses.  Good capillary refill. No calluses. Normal sensation with monofilament testing   Neurological:     Mental Status: He is alert and oriented to person, place, and time.        Assessment:     Type 2 diabetes improving with A1c down to 7.5%    Plan:     -Discussed options.  Patient prefers to give this another 3 months with lifestyle modification which we will do. -Flu vaccine already given -Continue regular exercise habits and low glycemic diet  Eulas Post MD [facility] at [facility]

## 2019-06-20 ENCOUNTER — Other Ambulatory Visit: Payer: Self-pay | Admitting: Family Medicine

## 2019-07-09 ENCOUNTER — Encounter: Payer: Self-pay | Admitting: *Deleted

## 2019-08-20 ENCOUNTER — Other Ambulatory Visit: Payer: Self-pay

## 2019-08-20 ENCOUNTER — Encounter: Payer: Self-pay | Admitting: Sports Medicine

## 2019-08-20 ENCOUNTER — Ambulatory Visit: Payer: Medicare Other | Admitting: Sports Medicine

## 2019-08-20 VITALS — BP 140/86 | Ht 75.0 in | Wt 210.0 lb

## 2019-08-20 DIAGNOSIS — G8929 Other chronic pain: Secondary | ICD-10-CM | POA: Diagnosis not present

## 2019-08-20 DIAGNOSIS — M5441 Lumbago with sciatica, right side: Secondary | ICD-10-CM

## 2019-08-20 DIAGNOSIS — M778 Other enthesopathies, not elsewhere classified: Secondary | ICD-10-CM | POA: Diagnosis not present

## 2019-08-20 MED ORDER — MELOXICAM 15 MG PO TABS: ORAL_TABLET | ORAL | 0 refills | Status: DC

## 2019-08-20 NOTE — Progress Notes (Signed)
PCP: Eulas Post, MD  Subjective:   HPI: Patient is a 69 y.o. male here for evaluation of low back pain and left wrist pain.  Patient notes his low back pain has been present for the last 6 years.  He denies any specific injury or trauma that started the pain.  Pain is located on the right-hand side he does have radiation of pain down his right leg occasionally.  He denies any numbness or tingling.  Patient notes the pain is unaffected by sitting or standing as well as it is unaffected by leaning forward or arching his back backwards.  Patient had an MRI in 2015 showing degenerative changes as well as bilateral pars defects at L5.  Patient notes he has been doing tai chi and this is helped his back pain.  He is requesting referral to physical therapy to work on back exercises.  Patient also notes left wrist pain for 1 month.  The pain is located on the ventral aspect of his wrist.  Pain does not radiate.  He believes this to be an overuse injury from chopping down bamboo in his yard.  He denies any numbness or tingling in his wrist.  Patient denies any injury or trauma.  Patient has not done anything for the wrist but notes gets pain with wrist flexion.  He also notes pain with doing bicep curls at the gym.   Review of Systems: See HPI above.  Past Medical History:  Diagnosis Date  . Allergy    allergy shots every 3 weeks   . Anxiety    past hx   . 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 Diabetes mellitus ([place]) 6/12   A1c 7.1 %   dx 4 yrs ago  . Gilbert syndrome   . Heart murmur    'when I was younger"  . History of chest pain    Hospitalized ER in [place] for CP and Palpitations with Neg Enzymes  . Hyperlipidemia   . Shingles    2015  . Testosterone deficiency    Dr Hartley Barefoot  . Thyroid disease   . 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 Ascorbic Acid (VITAMIN C) 250 MG tablet Take  250 mg by mouth daily.      Marland Kitchen b complex vitamins capsule Take 1 capsule by mouth daily.      . CHOLECALCIFEROL PO Take 1 tablet by mouth daily.    Marland Kitchen co-enzyme Q-10 30 MG capsule Take 30 mg by mouth daily.    . empagliflozin (JARDIANCE) 10 MG TABS tablet Take 10 mg by mouth daily. 90 tablet 3  . EPIPEN 2-PAK 0.3 MG/0.3ML SOAJ injection USE AS DIRECTED AS NEEDED  1  . glucose blood (ONE TOUCH ULTRA TEST) test strip Use once daily.  Dx E11.9 100 each 3  . imiquimod (ALDARA) 5 % cream APPLY 1 APPLICATION APPLY ON THE SKIN AS DIRECTED APPLY EVERY OTHER NIGHT FOR 4 WEEKS.    Marland Kitchen KRILL OIL PO Take 1 capsule by mouth daily.    Marland Kitchen levothyroxine (SYNTHROID, LEVOTHROID) 50 MCG tablet Take 1 tablet (50 mcg total) by mouth daily before breakfast. 90 tablet 3  . MAGNESIUM PO Take 1 tablet by mouth daily.    . metFORMIN (GLUCOPHAGE-XR) 750 MG 24 hr tablet Take 1 tablet (750 mg total) by mouth 2 (two) times daily. 180 tablet 0  .  neomycin-polymyxin b-dexamethasone (MAXITROL) 3.5-10000-0.1 SUSP INSTIL 1 DROP 4 TIMES DAILY IN RIGHT EYE FOR 5 DAYS THEN 2 TIMES DAILY FOR 5 DAYS    . Omega-3 Fatty Acids (FISH OIL) 1000 MG CAPS Take by mouth.    Marland Kitchen PRESCRIPTION MEDICATION Inject 1 each into the skin every 7 (seven) days. Patient gets an allergy shot once a week at [place] [address]    . Probiotic Product (PROBIOTIC DAILY PO) Take by mouth daily.    . rosuvastatin (CRESTOR) 10 MG tablet TAKE 1 TABLET BY MOUTH EVERY DAY 90 tablet 3  . Turmeric 500 MG CAPS Take 1,000 mg by mouth daily.     Current Facility-Administered Medications on File Prior to Visit  Medication Dose Route Frequency Provider Last Rate Last Admin  . 0.9 %  sodium chloride infusion  500 mL Intravenous Once Gatha Mayer, MD        Past Surgical History:  Procedure Laterality Date  . APPENDECTOMY    . CHOLECYSTECTOMY  2010  . COLONOSCOPY     Tics, Polyp 12/26/2003; polyps 2010, Dr Carlean Purl  . KNEE ARTHROSCOPY     Left  . LAPAROSCOPIC  APPENDECTOMY N/A 10/05/2015   Procedure: APPENDECTOMY LAPAROSCOPIC;  Surgeon: Judeth Horn, MD;  Location: [facility];  Service: General;  Laterality: N/A;  . NOSE SURGERY     Dr  Truman Hayward  . POLYPECTOMY    . ROTATOR CUFF REPAIR Bilateral    left  . SHOULDER ARTHROSCOPY      Allergies  Allergen Reactions  . Codeine Nausea Only  . Prozac [Fluoxetine Hcl] Anxiety    Increased anxiety    Social History   Socioeconomic History  . Marital status: Married    Spouse name: Not on file  . Number of children: Not on file  . Years of education: Not on file  . Highest education level: Not on file  Occupational History  . Not on file  Tobacco Use  . Smoking status: Never Smoker  . Smokeless tobacco: Never Used  Substance and Sexual Activity  . Alcohol use: Yes    Alcohol/week: 1.0 - 2.0 standard drinks    Types: 1 - 2 Standard drinks or equivalent per week    Comment: beer occ stopped wine  . Drug use: No  . Sexual activity: Not on file  Other Topics Concern  . Not on file  Social History Narrative  . Not on file   Social Determinants of Health   Financial Resource Strain:   . Difficulty of Paying Living Expenses: Not on file  Food Insecurity:   . Worried About Charity fundraiser in the Last Year: Not on file  . Ran Out of Food in the Last Year: Not on file  Transportation Needs:   . Lack of Transportation (Medical): Not on file  . Lack of Transportation (Non-Medical): Not on file  Physical Activity:   . Days of Exercise per Week: Not on file  . Minutes of Exercise per Session: Not on file  Stress:   . Feeling of Stress : Not on file  Social Connections:   . Frequency of Communication with Friends and Family: Not on file  . Frequency of Social Gatherings with Friends and Family: Not on file  . Attends Religious Services: Not on file  . Active Member of Clubs or Organizations: Not on file  . Attends Archivist Meetings: Not on file  . Marital Status: Not on file   Intimate Partner  Violence:   . Fear of Current or Ex-Partner: Not on file  . Emotionally Abused: Not on file  . Physically Abused: Not on file  . Sexually Abused: Not on file    Family History  Problem Relation Age of Onset  . Depression Father   . Heart disease Father        MI in 24s  . Prostate cancer Father   . OCD Father   . Depression Mother   . Arthritis Mother   . Stroke Mother 6  . Parkinsonism Mother   . Diabetes Mother   . Depression Sister        OCD  . Prostate cancer Paternal Grandfather   . Diabetes Maternal Grandfather        MI in 61s  . Colon cancer Neg Hx   . Colon polyps Neg Hx   . Esophageal cancer Neg Hx   . Rectal cancer Neg Hx   . Stomach cancer Neg Hx         Objective:  Physical Exam: There were no vitals taken for this visit. Gen: NAD, comfortable in exam room Lungs: Breathing comfortably on room air Lumbar Exam -Inspection: No deformity, no discoloration -Palpation: Tenderness palpation of the right SI joint -ROM: Normal ROM with forward flexion, extension, lateral bending bilaterally, and rotation bilaterally -Strength: 5/5 hip flexion bilaterally, 5/5 knee extension bilaterally, 5/5 knee flexion bilaterally, 5/5 foot dorsiflexion bilaterally, 5/5 foot plantarflexion bilaterally -Straight leg: Negative -SLUMP: Negative -FABER: Positive  Hand/wrist Exam Left -Inspection: No deformity, no discoloration -Palpation: Mild tenderness palpation over the ventral wrist -ROM: Normal range of motion with flexion, extension, radial deviation, ulnar deviation, pronation, supination -Strength: Flexion: 5/5; Extension: 5/5; Radial Deviation: 5/5; Ulnar Deviation: 5/5.  Patient had pain with resisted wrist flexion -Special Tests: Tinels: Negative; Phalens: Negative -Limb neurovascularly intact     Assessment & Plan:  Patient is a 69 y.o. male here for low back pain left wrist pain  1.  Chronic low back pain -MRI 2015 showing degenerative  changes as well as bilateral pars defects at L5 -Patient without any red flag signs for acute/worsening symptoms -Patient referred to physical therapy for back exercises as well as core strengthening  1.  Left wrist flexor tendinitis -Patient given a wrist brace to wear during activity -Prescription sent for meloxicam to take once a day for 1 week and then as needed after that.  Patient advised to not take other NSAIDs along with the meloxicam -Patient advised to modify his workout routine to avoid regular biceps curls.  He will switch these to hammer curls  Patient will follow up as needed  Patient seen and evaluated with the sports medicine fellow.  I agree with the above plan of care.  An MRI of his lumbar spine in 2015 showed bilateral pars defects at L5 but no evidence of listhesis.  Patient will start physical therapy and wean to home exercise program per the therapist discretion.  If symptoms persist or worsen I would start with getting x-rays of his lumbar spine including flexion and extension views. For his left wrist tendinitis, treatment as above.  Symptoms should resolve rather quickly with modified rest and a short course of anti-inflammatories. Follow-up for ongoing or recalcitrant issues.

## 2019-08-20 NOTE — Patient Instructions (Addendum)
Your back pain is caused by a pars defect seen in your MRI in 2015.  These are stress injuries to your low back and have been there for many years. -We gave you prescription for physical therapy to work on back exercises as well as core strengthening  Your wrist pain is caused by tendinitis of your wrist flexor tendons -Wear the wrist brace given to you at today's visit when doing activity.  This will help rest your wrist.  You do not need to wear this when you are not doing activity -A prescription for meloxicam was sent to the pharmacy for you.  This is a very strong anti-inflammatory.  Do not take it with other forms of NSAIDs such as Aleve or ibuprofen.  Take this once a day for 1 week and then daily as needed after that  We will see back as needed

## 2019-09-01 ENCOUNTER — Ambulatory Visit: Payer: Medicare Other | Attending: Internal Medicine

## 2019-09-01 DIAGNOSIS — Z23 Encounter for immunization: Secondary | ICD-10-CM

## 2019-09-01 NOTE — Progress Notes (Signed)
   Covid-19 Vaccination Clinic  Name:  MARGARITA JAVED    MRN: [id number] DOB: 05/16/1951  09/01/2019  Mr. Toews was observed post Covid-19 immunization for 15 minutes without incidence. He was provided with Vaccine Information Sheet and instruction to access the V-Safe system.   Mr. Ventura was instructed to call 911 with any severe reactions post vaccine: Marland Kitchen Difficulty breathing  . Swelling of your face and throat  . A fast heartbeat  . A bad rash all over your body  . Dizziness and weakness    Immunizations Administered    Name Date Dose VIS Date Route   Pfizer COVID-19 Vaccine 09/01/2019 12:34 PM 0.3 mL 07/05/2019 Intramuscular   Manufacturer: [place]   Lot: [id number]   [place]: [id number]

## 2019-09-06 ENCOUNTER — Other Ambulatory Visit: Payer: Self-pay

## 2019-09-09 ENCOUNTER — Other Ambulatory Visit: Payer: Self-pay

## 2019-09-09 ENCOUNTER — Encounter: Payer: Self-pay | Admitting: Family Medicine

## 2019-09-09 ENCOUNTER — Ambulatory Visit: Payer: Medicare Other | Admitting: Family Medicine

## 2019-09-09 VITALS — BP 120/72 | HR 63 | Temp 97.3°F | Ht 75.0 in | Wt 217.8 lb

## 2019-09-09 DIAGNOSIS — E039 Hypothyroidism, unspecified: Secondary | ICD-10-CM

## 2019-09-09 DIAGNOSIS — E119 Type 2 diabetes mellitus without complications: Secondary | ICD-10-CM | POA: Diagnosis not present

## 2019-09-09 DIAGNOSIS — E785 Hyperlipidemia, unspecified: Secondary | ICD-10-CM | POA: Diagnosis not present

## 2019-09-09 LAB — POCT GLYCOSYLATED HEMOGLOBIN (HGB A1C): Hemoglobin A1C: 7.9 % — AB (ref 4.0–5.6)

## 2019-09-09 NOTE — Progress Notes (Signed)
Subjective:     Patient ID: Aaron Bradshaw, male   DOB: July 23, 1951, 69 y.o.   MRN: [id number]  HPI   Aaron Bradshaw is here for medical follow-up.  He has type 2 diabetes and has had suboptimal control.  He has been poorly compliant with diet at times eating a lot of carbs.  He is exercising regularly.  Generally feels well overall.  No significant polyuria or polydipsia.  He is taking Jardiance regularly and also Metformin twice daily.  He has been reluctant to add other medications.  He has hypothyroidism and is on replacement.  His last thyroid was checked in April of last year.  Those results are reviewed.  He has hyperlipidemia treated with Crestor.  Compliant with medications.  Past Medical History:  Diagnosis Date  . Allergy    allergy shots every 3 weeks   . Anxiety    past hx   . 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 Diabetes mellitus ([place]) 6/12   A1c 7.1 %   dx 4 yrs ago  . Gilbert syndrome   . Heart murmur    'when I was younger"  . History of chest pain    Hospitalized ER in [place] for CP and Palpitations with Neg Enzymes  . Hyperlipidemia   . Shingles    2015  . Testosterone deficiency    Dr Aaron Bradshaw  . Thyroid disease   . 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 APPENDECTOMY    . CHOLECYSTECTOMY  2010  . COLONOSCOPY     Tics, Polyp 12/26/2003; polyps 2010, Dr Aaron Bradshaw  . KNEE ARTHROSCOPY     Left  . LAPAROSCOPIC APPENDECTOMY N/A 10/05/2015   Procedure: APPENDECTOMY LAPAROSCOPIC;  Surgeon: Aaron Horn, MD;  Location: [facility];  Service: General;  Laterality: N/A;  . NOSE SURGERY     Dr  Aaron Bradshaw  . POLYPECTOMY    . ROTATOR CUFF REPAIR Bilateral    left  . SHOULDER ARTHROSCOPY      reports that he has never smoked. He has never used smokeless tobacco. He reports current alcohol use of about 1.0 - 2.0 standard drinks of alcohol per week. He reports that he does not use drugs. family history includes Arthritis in his  mother; Depression in his father, mother, and sister; Diabetes in his maternal grandfather and mother; Heart disease in his father; OCD in his father; Parkinsonism in his mother; Prostate cancer in his father and paternal grandfather; Stroke (age of onset: 36) in his mother. Allergies  Allergen Reactions  . Codeine Nausea Only  . Prozac [Fluoxetine Hcl] Anxiety    Increased anxiety     Review of Systems  Constitutional: Negative for fatigue.  Eyes: Negative for visual disturbance.  Respiratory: Negative for cough, chest tightness and shortness of breath.   Cardiovascular: Negative for chest pain, palpitations and leg swelling.  Endocrine: Negative for polydipsia and polyuria.  Neurological: Negative for dizziness, syncope, weakness, light-headedness and headaches.       Objective:   Physical Exam Vitals reviewed.  Constitutional:      Appearance: Normal appearance.  Cardiovascular:     Rate and Rhythm: Normal rate and regular rhythm.  Pulmonary:     Effort: Pulmonary effort is normal.     Breath sounds: Normal breath sounds.  Musculoskeletal:     Right lower leg: No edema.     Left lower leg: No edema.  Neurological:  Mental Status: He is alert.        Assessment:     #1 type 2 diabetes suboptimally controlled with A1c today 7.9%  #2 hyperlipidemia treated with Crestor.  He will be due for follow-up labs in April  #3 hypothyroidism on replacement and stable by last labs but due for repeat in April    Plan:     -Long discussion of options.  He is very reluctant to consider additional medications.  We discussed GLP-1 class which would be a good add-on.  He prefers 3 months of lifestyle modification and then repeat from there.  Aaron Post MD [facility] at [facility]

## 2019-09-18 ENCOUNTER — Other Ambulatory Visit: Payer: Self-pay | Admitting: Family Medicine

## 2019-09-25 ENCOUNTER — Other Ambulatory Visit: Payer: Self-pay | Admitting: Family Medicine

## 2019-09-25 ENCOUNTER — Ambulatory Visit: Payer: Medicare Other

## 2019-09-25 ENCOUNTER — Ambulatory Visit: Payer: Medicare Other | Attending: Internal Medicine

## 2019-09-25 DIAGNOSIS — Z23 Encounter for immunization: Secondary | ICD-10-CM | POA: Insufficient documentation

## 2019-09-25 NOTE — Progress Notes (Signed)
   Covid-19 Vaccination Clinic  Name:  Aaron Bradshaw    MRN: [id number] DOB: 21-Aug-1950  09/25/2019  Aaron Bradshaw was observed post Covid-19 immunization for 15 minutes without incident. He was provided with Vaccine Information Sheet and instruction to access the V-Safe system.   Aaron Bradshaw was instructed to call 911 with any severe reactions post vaccine: Marland Kitchen Difficulty breathing  . Swelling of face and throat  . A fast heartbeat  . A bad rash all over body  . Dizziness and weakness   Immunizations Administered    Name Date Dose VIS Date Route   Pfizer COVID-19 Vaccine 09/25/2019 10:28 AM 0.3 mL 07/05/2019 Intramuscular   Manufacturer: [place]   Lot: [id number]   [place]: [id number]

## 2019-09-29 ENCOUNTER — Other Ambulatory Visit: Payer: Self-pay | Admitting: Sports Medicine

## 2019-11-10 ENCOUNTER — Other Ambulatory Visit: Payer: Self-pay | Admitting: Family Medicine

## 2019-12-09 ENCOUNTER — Ambulatory Visit (INDEPENDENT_AMBULATORY_CARE_PROVIDER_SITE_OTHER): Payer: Medicare Other | Admitting: Family Medicine

## 2019-12-09 ENCOUNTER — Other Ambulatory Visit: Payer: Self-pay

## 2019-12-09 ENCOUNTER — Encounter: Payer: Self-pay | Admitting: Family Medicine

## 2019-12-09 VITALS — BP 138/80 | HR 69 | Temp 97.9°F | Wt 213.2 lb

## 2019-12-09 DIAGNOSIS — M7022 Olecranon bursitis, left elbow: Secondary | ICD-10-CM

## 2019-12-09 DIAGNOSIS — E119 Type 2 diabetes mellitus without complications: Secondary | ICD-10-CM | POA: Diagnosis not present

## 2019-12-09 DIAGNOSIS — E039 Hypothyroidism, unspecified: Secondary | ICD-10-CM

## 2019-12-09 DIAGNOSIS — E785 Hyperlipidemia, unspecified: Secondary | ICD-10-CM | POA: Diagnosis not present

## 2019-12-09 LAB — POCT GLYCOSYLATED HEMOGLOBIN (HGB A1C): Hemoglobin A1C: 7.3 % — AB (ref 4.0–5.6)

## 2019-12-09 NOTE — Patient Instructions (Signed)
Set up future labs.    Keep up the good work.   A1C today down to 7.3%.

## 2019-12-09 NOTE — Progress Notes (Signed)
Subjective:     Patient ID: Aaron Bradshaw, male   DOB: [birth year], 69 y.o.   MRN: [id number]  HPI Aaron Bradshaw is seen for medical follow-up.  His last A1c was 7.9%.  We discussed possible additional medications for treatment but he wish to work on weight loss and increased exercise.  He is done a great job since then has dropped some pounds and A1c down to 7.3% today.  He has history of hypothyroidism and is on replacement and overdue for labs.  Also needs follow-up lipids.  Current medications include Jardiance, levothyroxine, Metformin, Crestor.  Generally feels well.  No recent chest pains.  No polyuria or polydipsia.  Compliant with all medications.  He has had some mild swelling left olecranon bursa.  Denies any specific injury.  No pain.  Full range of motion elbow.  Past Medical History:  Diagnosis Date  . Allergy    allergy shots every 3 weeks   . Anxiety    past hx   . 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 Diabetes mellitus ([place]) 6/12   A1c 7.1 %   dx 4 yrs ago  . Gilbert syndrome   . Heart murmur    'when I was younger"  . History of chest pain    Hospitalized ER in [place] for CP and Palpitations with Neg Enzymes  . Hyperlipidemia   . Shingles    2015  . Testosterone deficiency    Dr Hartley Barefoot  . Thyroid disease   . 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 APPENDECTOMY    . CHOLECYSTECTOMY  2010  . COLONOSCOPY     Tics, Polyp 12/26/2003; polyps 2010, Dr Carlean Purl  . KNEE ARTHROSCOPY     Left  . LAPAROSCOPIC APPENDECTOMY N/A 10/05/2015   Procedure: APPENDECTOMY LAPAROSCOPIC;  Surgeon: Judeth Horn, MD;  Location: [facility];  Service: General;  Laterality: N/A;  . NOSE SURGERY     Dr  Truman Hayward  . POLYPECTOMY    . ROTATOR CUFF REPAIR Bilateral    left  . SHOULDER ARTHROSCOPY      reports that he has never smoked. He has never used smokeless tobacco. He reports current alcohol use of about 1.0 - 2.0 standard drinks of  alcohol per week. He reports that he does not use drugs. family history includes Arthritis in his mother; Depression in his father, mother, and sister; Diabetes in his maternal grandfather and mother; Heart disease in his father; OCD in his father; Parkinsonism in his mother; Prostate cancer in his father and paternal grandfather; Stroke (age of onset: 22) in his mother. Allergies  Allergen Reactions  . Codeine Nausea Only  . Prozac [Fluoxetine Hcl] Anxiety    Increased anxiety   Wt Readings from Last 3 Encounters:  12/09/19 213 lb 3.2 oz (96.7 kg)  09/09/19 217 lb 12.8 oz (98.8 kg)  08/20/19 210 lb (95.3 kg)     Review of Systems  Constitutional: Negative for fatigue.  Eyes: Negative for visual disturbance.  Respiratory: Negative for cough, chest tightness and shortness of breath.   Cardiovascular: Negative for chest pain, palpitations and leg swelling.  Endocrine: Negative for polydipsia and polyuria.  Neurological: Negative for dizziness, syncope, weakness, light-headedness and headaches.       Objective:   Physical Exam Vitals reviewed.  Constitutional:      Appearance: Normal appearance.  Cardiovascular:     Rate and Rhythm: Normal rate and regular  rhythm.  Pulmonary:     Effort: Pulmonary effort is normal.     Breath sounds: Normal breath sounds.  Musculoskeletal:     Right lower leg: No edema.     Left lower leg: No edema.     Comments: Left olecranon bursa slightly swollen.  There is no warmth or erythema.  Nontender.  Full range of motion elbow  Neurological:     Mental Status: He is alert.        Assessment:     #1 type 2 diabetes improved with A1c 7.3% today  #2 hypothyroidism.  Due for repeat labs.  #3 hyperlipidemia treated with Crestor.  Due for follow-up labs.  He is nonfasting today and prefers to wait and get fasting labs  #4 mild left olecranon bursitis.  No signs of secondary infection.    Plan:     -Future labs ordered with lipid panel,  basic metabolic panel, hepatic panel -Continue lifestyle management of his diabetes.  He remains reluctant to add additional medication at this time.  We will plan 66-month follow-up and hopefully can see A1c below 7% at that time -Keep pressure off the left elbow.  Follow-up promptly for any signs of infection such as erythema or warmth  Eulas Post MD [facility] at [facility]

## 2019-12-19 ENCOUNTER — Other Ambulatory Visit: Payer: Self-pay | Admitting: Family Medicine

## 2020-01-02 ENCOUNTER — Other Ambulatory Visit: Payer: Medicare Other

## 2020-01-02 ENCOUNTER — Other Ambulatory Visit (INDEPENDENT_AMBULATORY_CARE_PROVIDER_SITE_OTHER): Payer: Medicare Other

## 2020-01-02 DIAGNOSIS — E039 Hypothyroidism, unspecified: Secondary | ICD-10-CM

## 2020-01-02 DIAGNOSIS — E119 Type 2 diabetes mellitus without complications: Secondary | ICD-10-CM

## 2020-01-02 DIAGNOSIS — E785 Hyperlipidemia, unspecified: Secondary | ICD-10-CM | POA: Diagnosis not present

## 2020-01-02 LAB — BASIC METABOLIC PANEL
BUN: 22 mg/dL (ref 6–23)
CO2: 28 mEq/L (ref 19–32)
Calcium: 8.9 mg/dL (ref 8.4–10.5)
Chloride: 100 mEq/L (ref 96–112)
Creatinine, Ser: 0.9 mg/dL (ref 0.40–1.50)
GFR: 83.87 mL/min (ref >60.00)
Glucose, Bld: 166 mg/dL — ABNORMAL HIGH (ref 70–99)
Potassium: 4 mEq/L (ref 3.5–5.1)
Sodium: 136 mEq/L (ref 135–145)

## 2020-01-02 LAB — LIPID PANEL
Cholesterol: 187 mg/dL (ref 0–200)
HDL: 37.4 mg/dL — ABNORMAL LOW (ref >39.00)
LDL Cholesterol: 118 mg/dL — ABNORMAL HIGH (ref 0–99)
NonHDL: 149.73
Total CHOL/HDL Ratio: 5
Triglycerides: 160 mg/dL — ABNORMAL HIGH (ref 0.0–149.0)
VLDL: 32 mg/dL (ref 0.0–40.0)

## 2020-01-02 LAB — TSH: TSH: 5.65 u[IU]/mL — ABNORMAL HIGH (ref 0.35–4.50)

## 2020-01-02 LAB — HEPATIC FUNCTION PANEL
ALT: 18 U/L (ref 0–53)
AST: 18 U/L (ref 0–37)
Albumin: 4.3 g/dL (ref 3.5–5.2)
Alkaline Phosphatase: 78 U/L (ref 39–117)
Bilirubin, Direct: 0.2 mg/dL (ref 0.0–0.3)
Total Bilirubin: 0.9 mg/dL (ref 0.2–1.2)
Total Protein: 7.2 g/dL (ref 6.0–8.3)

## 2020-01-06 ENCOUNTER — Other Ambulatory Visit: Payer: Self-pay

## 2020-01-06 MED ORDER — LEVOTHYROXINE SODIUM 75 MCG PO TABS
75.0000 ug | ORAL_TABLET | Freq: Every day | ORAL | 1 refills | Status: DC
Start: 2020-01-06 — End: 2020-07-08

## 2020-02-06 ENCOUNTER — Ambulatory Visit: Payer: Medicare Other | Admitting: Sports Medicine

## 2020-02-06 ENCOUNTER — Encounter: Payer: Self-pay | Admitting: Sports Medicine

## 2020-02-06 ENCOUNTER — Other Ambulatory Visit: Payer: Self-pay

## 2020-02-06 VITALS — BP 144/72 | Ht 75.0 in | Wt 205.0 lb

## 2020-02-06 DIAGNOSIS — M7022 Olecranon bursitis, left elbow: Secondary | ICD-10-CM

## 2020-02-06 NOTE — Progress Notes (Signed)
   Subjective:    Patient ID: Aaron Bradshaw, male    DOB: 02/12/1951, 69 y.o.   MRN: [id number]  HPI chief complaint: Left elbow swelling  69 year old right-hand-dominant male comes in today complaining of left elbow swelling since February of this year.  He initially noticed swelling in the elbow after he slipped and struck his elbow on the ice.  This was followed a couple of days later by a second injury that occurred while chasing a burglar out of a local [employer] store.  He has had persistent swelling over the tip of his elbow ever since.  It has gotten a little bit larger recently.  It is not painful.  No fevers or chills.  No prior occurrences.  He would like to have it aspirated today.  Past medical history reviewed Medications reviewed Allergies reviewed    Review of Systems As above    Objective:   Physical Exam  Well-developed, well-nourished.  No acute distress.  Awake alert and oriented x3.  Vital signs reviewed  Left elbow: Full range of motion.  No effusion.  There is a moderate amount of swelling in the olecranon bursa.  It is nontender to palpation.  Nonerythematous.  No bony tenderness to palpation.  Neurovascularly intact distally.      Assessment & Plan:   Left elbow olecranon bursitis  Under sterile technique, the left elbow olecranon bursa was aspirated today.  1 cc of 1% Xylocaine was used to numb a small area over the olecranon bursa.  This was followed by aspiration of 12 cc of blood-tinged fluid from the olecranon bursa using an 18-gauge needle.  This resulted in complete resolution of elbow swelling with a slightly thickened bursal sac remaining.  Sterile dressing applied.  Ace wrap for compression.  Patient is instructed to purchase an elbow compression sleeve and to wear continuously during the day for the next 4 to 5 days.  He is instructed not to sleep in the elbow sleeve however.  He may resume activity as tolerated in 48 hours and follow-up with me  for ongoing or recalcitrant issues.

## 2020-02-18 ENCOUNTER — Ambulatory Visit: Payer: Medicare Other | Admitting: Sports Medicine

## 2020-02-18 ENCOUNTER — Other Ambulatory Visit: Payer: Self-pay

## 2020-02-18 VITALS — BP 124/78 | Ht 75.0 in | Wt 210.0 lb

## 2020-02-18 DIAGNOSIS — M7022 Olecranon bursitis, left elbow: Secondary | ICD-10-CM | POA: Diagnosis not present

## 2020-02-18 NOTE — Progress Notes (Signed)
Aaron Bradshaw - 69 y.o. male MRN [id number]  Date of birth: August 24, 1950    SUBJECTIVE:      Chief Complaint:/ HPI:  Left olecranon bursitis: Patient previously seen July 15 for swollen left olecranon bursa.  Bursa was aspirated, removing 12 mL of fluid.  Returns to clinic today reporting that present swelling has returned and is now worse than previously. He continues to swim, lift weights. Denies any redness, warmth from the elbow, elbow/joint pain, fevers, chills, or rashes.    ROS:     See HPI  PERTINENT  PMH / PSH FH / / SH:  Past Medical, Surgical, Social, and Family History Reviewed & Updated in the EMR.  Pertinent findings include:  Patient Active Problem List   Diagnosis Date Noted  . Olecranon bursitis of left elbow 02/18/2020  . Hypothyroid 08/20/2018  . Dyslipidemia 01/10/2018  . Rupture of right proximal biceps tendon 02/03/2015  . Hx of adenomatous colonic polyps 01/23/2015  . Anxiety state, unspecified 04/02/2014  . 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 Leg paresthesia 11/14/2013  . Lumbar nerve root impingement 11/09/2013  . Numbness in feet 11/09/2013  . Night sweats 03/16/2013  . Shoulder pain 07/30/2012  . Testosterone deficiency 06/18/2012  . Skin cancer 01/18/2012  . Type 2 diabetes mellitus, controlled ([place]) 08/02/2011  . TINNITUS 05/25/2010  . INSOMNIA-SLEEP DISORDER-UNSPEC 04/05/2010  . Hyperlipidemia 10/26/2007  . 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 PROSTATITIS, RECURRENT 10/26/2007    OBJECTIVE: BP 124/78   Ht 6\' 3"  (1.905 m)   Wt (!) 210 lb (95.3 kg)   BMI 26.25 kg/m   Physical Exam:  Vital signs are reviewed.  GEN: Alert and oriented, NAD Pulm: Breathing unlabored PSY: normal mood, congruent affect  MSK: Left elbow: superficial soft fluctuant mass appreciated without associated erythema, ecchymosis; no bleeding, drainage, or evidence of infection appreciated  Procedure Left olecranon bursitis aspiration: Patient laid supine on  examination table with left elbow on the edge of the table.  Timeout was called and patient identified with 2 identifiers.  Elta [place] was made on patient's left elbow over fluctuant fluid-filled mass with a pen.  Site was cleaned with alcohol swab x2.  1 cc 1% Xylocaine without epinephrine injected into bursa for anesthesia using 25-gauge needle.  Site was cleansed again with topical Betadine.  18-gauge needle attached to syringe inserted into bursa, 15 cc blood-tinged nonpurulent serosanguineous fluid aspirated from bursa.  Sample will be sent for analysis.  ASSESSMENT & PLAN:  1.  Olecranon bursitis, left: Site swollen again today worse than previously, 15 cc blood-tinged nonpurulent, serosanguineous fluid aspirated from bursa.  Patient has had other joint operations performed by Dr. Noemi Chapel, would like to go back to him for bursa removal.  Patient notified [place] he is traveling to [place] by car for a family member's funeral within the next few days. -Aleve 500 mg twice daily with food, can also add Tylenol 500 mg 3 times daily. -Continue elbow compression with sleeve -Strict return precautions given regarding concerning signs/symptoms (erythema, elbow pain, worsening swelling), encouraged to seek medical attention -Continue topical cold compresses -Elbow aspirate sent for analysis -Follow-up after returning from [place] as needed for elbow swelling, pain  Milus Banister, [place], PGY-3 02/18/2020 4:27 PM   Patient seen and evaluated with the resident.  I agree with the above plan of care.  Repeat aspiration was performed today.  We will send the fluid for analysis.  Patient will resume wearing his compression  sleeve for the next 72 hours removing it only at night to sleep.  He is traveling to [place] for the next several days but upon his return he may request a referral to Dr. Noemi Chapel to discuss olecranon bursectomy.  Follow-up as needed.

## 2020-02-18 NOTE — Patient Instructions (Addendum)
Thank you for coming in to see [place] today! Please see below to review our plan for today's visit:  1.  Recommend Aleve 500 mg twice daily with food for pain relief, can also take Tylenol 500 mg 3 times daily with daily for additional pain relief. 2.  Continue to wear compression sleeve to left arm. 3.  Please seek immediate medical attention if your elbow should become hot, red, and painful to touch/motion. 4.  Can apply ice as needed for pain, swelling. 5.  We are placing referral to [facility] for bursa removal.  Follow-up ~4 weeks or sooner as needed.  Please call the clinic at [phone] if your symptoms worsen or you have any concerns. It was our pleasure to serve you!   Dr. Everlean Alstrom Dr. Milus Banister [facility]

## 2020-02-19 LAB — SYNOVIAL FLUID, CELL COUNT
Nuc cell # Fld: 226 cells/uL — ABNORMAL HIGH (ref 0–200)
RBC, Fluid: 66000 /uL

## 2020-02-22 LAB — BODY FLUID CULTURE

## 2020-03-10 ENCOUNTER — Ambulatory Visit: Payer: Medicare Other | Admitting: Family Medicine

## 2020-03-10 ENCOUNTER — Encounter: Payer: Self-pay | Admitting: Family Medicine

## 2020-03-10 ENCOUNTER — Other Ambulatory Visit: Payer: Self-pay

## 2020-03-10 VITALS — BP 126/80 | HR 51 | Temp 97.7°F | Wt 212.0 lb

## 2020-03-10 DIAGNOSIS — E785 Hyperlipidemia, unspecified: Secondary | ICD-10-CM | POA: Diagnosis not present

## 2020-03-10 DIAGNOSIS — E039 Hypothyroidism, unspecified: Secondary | ICD-10-CM | POA: Diagnosis not present

## 2020-03-10 DIAGNOSIS — E119 Type 2 diabetes mellitus without complications: Secondary | ICD-10-CM | POA: Diagnosis not present

## 2020-03-10 LAB — POCT GLYCOSYLATED HEMOGLOBIN (HGB A1C): Hemoglobin A1C: 8.3 % — AB (ref 4.0–5.6)

## 2020-03-10 MED ORDER — EMPAGLIFLOZIN 25 MG PO TABS: 25.0000 mg | ORAL_TABLET | Freq: Every day | ORAL | 11 refills | Status: DC

## 2020-03-10 NOTE — Patient Instructions (Signed)
Increase the Jardiance to 25 mg daily  We need to repeat A1C in 3 months  A1C today is surprisingly up to 8.3%

## 2020-03-10 NOTE — Progress Notes (Signed)
Established Patient Office Visit  Subjective:  Patient ID: Aaron Bradshaw, male    DOB: 09-09-1950  Age: 69 y.o. MRN: [id number]  CC: No chief complaint on file.   HPI Aaron Bradshaw presents for medical follow-up.  He has type 2 diabetes and he has been little more diligent with exercises last visit and has lost some weight.  Not monitoring blood sugars regularly.  Takes Jardiance 10 mg daily and regularly along with extended release Metformin.  No significant polyuria or polydipsia.  He is getting regular eye exams.  He is due for urine microalbumin screen  Hypothyroidism.  Back in June we titrated his levothyroxine up to 75 mcg.  Needs follow-up now.  No major fatigue issues.  He has hyperlipidemia treated with Crestor.  He was not taking his medicine regularly when the labs were checked in June.  At that point his LDL was over 100.  He states he is taking Crestor regularly at this point.  He was having significant arthralgias and started taking over-the-counter relief factor and states that has helped  Past Medical History:  Diagnosis Date  . Allergy    allergy shots every 3 weeks   . Anxiety    past hx   . 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 Diabetes mellitus ([place]) 6/12   A1c 7.1 %   dx 4 yrs ago  . Gilbert syndrome   . Heart murmur    'when I was younger"  . History of chest pain    Hospitalized ER in [place] for CP and Palpitations with Neg Enzymes  . Hyperlipidemia   . Shingles    2015  . Testosterone deficiency    Dr Hartley Barefoot  . Thyroid disease   . Transverse myelitis ([place]) 10/2013    Past Surgical History:  Procedure Laterality Date  . APPENDECTOMY    . CHOLECYSTECTOMY  2010  . COLONOSCOPY     Tics, Polyp 12/26/2003; polyps 2010, Dr Carlean Purl  . KNEE ARTHROSCOPY     Left  . LAPAROSCOPIC APPENDECTOMY N/A 10/05/2015   Procedure: APPENDECTOMY LAPAROSCOPIC;  Surgeon: Judeth Horn, MD;  Location: [facility];  Service: General;  Laterality: N/A;  . NOSE  SURGERY     Dr  Truman Hayward  . POLYPECTOMY    . ROTATOR CUFF REPAIR Bilateral    left  . SHOULDER ARTHROSCOPY      Family History  Problem Relation Age of Onset  . Depression Father   . Heart disease Father        MI in 65s  . Prostate cancer Father   . OCD Father   . Depression Mother   . Arthritis Mother   . Stroke Mother 47  . Parkinsonism Mother   . Diabetes Mother   . Depression Sister        OCD  . Prostate cancer Paternal Grandfather   . Diabetes Maternal Grandfather        MI in 55s  . Colon cancer Neg Hx   . Colon polyps Neg Hx   . Esophageal cancer Neg Hx   . Rectal cancer Neg Hx   . Stomach cancer Neg Hx     Social History   Socioeconomic History  . Marital status: Married    Spouse name: Not on file  . Number of children: Not on file  . Years of education: Not on file  . Highest education level: Not on file  Occupational History  . Not  on file  Tobacco Use  . Smoking status: Never Smoker  . Smokeless tobacco: Never Used  Vaping Use  . Vaping Use: Never used  Substance and Sexual Activity  . Alcohol use: Yes    Alcohol/week: 1.0 - 2.0 standard drink    Types: 1 - 2 Standard drinks or equivalent per week    Comment: beer occ stopped wine  . Drug use: No  . Sexual activity: Not on file  Other Topics Concern  . Not on file  Social History Narrative  . Not on file   Social Determinants of Health   Financial Resource Strain:   . Difficulty of Paying Living Expenses:   Food Insecurity:   . Worried About Charity fundraiser in the Last Year:   . Arboriculturist in the Last Year:   Transportation Needs:   . Film/video editor (Medical):   Marland Kitchen Lack of Transportation (Non-Medical):   Physical Activity:   . Days of Exercise per Week:   . Minutes of Exercise per Session:   Stress:   . Feeling of Stress :   Social Connections:   . Frequency of Communication with Friends and Family:   . Frequency of Social Gatherings with Friends and Family:   .  Attends Religious Services:   . Active Member of Clubs or Organizations:   . Attends Archivist Meetings:   Marland Kitchen Marital Status:   Intimate Partner Violence:   . Fear of Current or Ex-Partner:   . Emotionally Abused:   Marland Kitchen Physically Abused:   . Sexually Abused:     Outpatient Medications Prior to Visit  Medication Sig Dispense Refill  . Ascorbic Acid (VITAMIN C) 250 MG tablet Take 250 mg by mouth daily.      Marland Kitchen b complex vitamins capsule Take 1 capsule by mouth daily.      . CHOLECALCIFEROL PO Take 1 tablet by mouth daily.    Marland Kitchen co-enzyme Q-10 30 MG capsule Take 30 mg by mouth daily.    Marland Kitchen EPIPEN 2-PAK 0.3 MG/0.3ML SOAJ injection USE AS DIRECTED AS NEEDED  1  . glucose blood (ONE TOUCH ULTRA TEST) test strip Use once daily.  Dx E11.9 100 each 3  . imiquimod (ALDARA) 5 % cream APPLY 1 APPLICATION APPLY ON THE SKIN AS DIRECTED APPLY EVERY OTHER NIGHT FOR 4 WEEKS.    Marland Kitchen KRILL OIL PO Take 1 capsule by mouth daily.    Marland Kitchen levothyroxine (SYNTHROID) 75 MCG tablet Take 1 tablet (75 mcg total) by mouth daily. 90 tablet 1  . MAGNESIUM PO Take 1 tablet by mouth daily.    . meloxicam (MOBIC) 15 MG tablet Take 1 tablet daily with food for 7 days. Then take as needed. 40 tablet 0  . metFORMIN (GLUCOPHAGE-XR) 750 MG 24 hr tablet TAKE 1 TABLET (750 MG TOTAL) BY MOUTH 2 (TWO) TIMES DAILY. 180 tablet 0  . neomycin-polymyxin b-dexamethasone (MAXITROL) 3.5-10000-0.1 SUSP INSTIL 1 DROP 4 TIMES DAILY IN RIGHT EYE FOR 5 DAYS THEN 2 TIMES DAILY FOR 5 DAYS    . Omega-3 Fatty Acids (FISH OIL) 1000 MG CAPS Take by mouth.    Marland Kitchen PRESCRIPTION MEDICATION Inject 1 each into the skin every 7 (seven) days. Patient gets an allergy shot once a week at [place] off of [address]    . Probiotic Product (PROBIOTIC DAILY PO) Take by mouth daily.    . rosuvastatin (CRESTOR) 10 MG tablet TAKE 1 TABLET BY MOUTH EVERY DAY 90 tablet  3  . Turmeric 500 MG CAPS Take 1,000 mg by mouth daily.    Marland Kitchen JARDIANCE 10 MG TABS tablet TAKE 1  TABLET BY MOUTH EVERY DAY 90 tablet 3  . levothyroxine (SYNTHROID) 50 MCG tablet TAKE 1 TABLET BY MOUTH DAILY BEFORE BREAKFAST 90 tablet 0   Facility-Administered Medications Prior to Visit  Medication Dose Route Frequency Provider Last Rate Last Admin  . 0.9 %  sodium chloride infusion  500 mL Intravenous Once Gatha Mayer, MD        Allergies  Allergen Reactions  . Codeine Nausea Only  . Prozac [Fluoxetine Hcl] Anxiety    Increased anxiety    ROS Review of Systems  Constitutional: Negative for fatigue and unexpected weight change.  Eyes: Negative for visual disturbance.  Respiratory: Negative for cough, chest tightness and shortness of breath.   Cardiovascular: Negative for chest pain, palpitations and leg swelling.  Endocrine: Negative for polydipsia and polyuria.  Neurological: Negative for dizziness, syncope, weakness, light-headedness and headaches.      Objective:    Physical Exam Constitutional:      Appearance: He is well-developed.  Neck:     Thyroid: No thyromegaly.  Cardiovascular:     Rate and Rhythm: Normal rate and regular rhythm.  Pulmonary:     Effort: Pulmonary effort is normal. No respiratory distress.     Breath sounds: Normal breath sounds. No wheezing or rales.  Musculoskeletal:     Cervical back: Neck supple.     Right lower leg: No edema.     Left lower leg: No edema.  Skin:    Coloration: Pallor:   Neurological:     Mental Status: He is alert and oriented to person, place, and time.     BP 126/80 (BP Location: Left Arm, Patient Position: Sitting, Cuff Size: Normal)   Pulse (!) 51   Temp 97.7 F (36.5 C) (Oral)   Wt 212 lb (96.2 kg)   SpO2 96%   BMI 26.50 kg/m  Wt Readings from Last 3 Encounters:  03/10/20 212 lb (96.2 kg)  02/18/20 (!) 210 lb (95.3 kg)  02/06/20 205 lb (93 kg)     Health Maintenance Due  Topic Date Due  . PNA vac Low Risk Adult (2 of 2 - PPSV23) 01/11/2019  . URINE MICROALBUMIN  08/21/2019  . INFLUENZA  VACCINE  02/23/2020    There are no preventive care reminders to display for this patient.  Lab Results  Component Value Date   TSH 5.65 (H) 01/02/2020   Lab Results  Component Value Date   WBC 7.5 03/27/2018   HGB 15.8 03/27/2018   HCT 44.7 03/27/2018   MCV 87.2 03/27/2018   PLT 193.0 03/27/2018   Lab Results  Component Value Date   NA 136 01/02/2020   K 4.0 01/02/2020   CO2 28 01/02/2020   GLUCOSE 166 (H) 01/02/2020   BUN 22 01/02/2020   CREATININE 0.90 01/02/2020   BILITOT 0.9 01/02/2020   ALKPHOS 78 01/02/2020   AST 18 01/02/2020   ALT 18 01/02/2020   PROT 7.2 01/02/2020   ALBUMIN 4.3 01/02/2020   CALCIUM 8.9 01/02/2020   ANIONGAP 9 09/25/2015   GFR 83.87 01/02/2020   Lab Results  Component Value Date   CHOL 187 01/02/2020   Lab Results  Component Value Date   HDL 37.40 (L) 01/02/2020   Lab Results  Component Value Date   LDLCALC 118 (H) 01/02/2020   Lab Results  Component Value Date  TRIG 160.0 (H) 01/02/2020   Lab Results  Component Value Date   CHOLHDL 5 01/02/2020   Lab Results  Component Value Date   HGBA1C 8.3 (A) 03/10/2020      Assessment & Plan:   #1 type 2 diabetes.  Surprisingly A1c up to 8.3% today.  -Tighten up diet -Increase Jardiance to 25 mg daily -Recheck in 3 months.  If not closer to goal that point consider GLP-1 medication versus possibly Januvia  #2 hypothyroidism.  Recently under replaced.  We recently adjusted his levothyroxine as above -Recheck TSH today  #3 hyperlipidemia treated with Crestor.  Goal LDL less than 70  -Continue Crestor and recheck lipids at follow-up  Meds ordered this encounter  Medications  . empagliflozin (JARDIANCE) 25 MG TABS tablet    Sig: Take 1 tablet (25 mg total) by mouth daily before breakfast.    Dispense:  30 tablet    Refill:  11    Follow-up: Return in about 3 months (around 06/10/2020).    Carolann Littler, MD

## 2020-03-11 LAB — MICROALBUMIN / CREATININE URINE RATIO
Creatinine, Urine: 62 mg/dL (ref 20–320)
Microalb, Ur: <0.2 mg/dL

## 2020-03-11 LAB — TSH: TSH: 2.58 m[IU]/L (ref 0.40–4.50)

## 2020-03-28 ENCOUNTER — Other Ambulatory Visit: Payer: Self-pay | Admitting: Family Medicine

## 2020-04-03 ENCOUNTER — Ambulatory Visit (INDEPENDENT_AMBULATORY_CARE_PROVIDER_SITE_OTHER): Payer: Medicare Other

## 2020-04-03 ENCOUNTER — Other Ambulatory Visit: Payer: Self-pay

## 2020-04-03 DIAGNOSIS — Z0000 Encounter for general adult medical examination without abnormal findings: Secondary | ICD-10-CM | POA: Diagnosis not present

## 2020-04-03 NOTE — Patient Instructions (Signed)
Aaron Bradshaw , Thank you for taking time to come for your Medicare Wellness Visit. I appreciate your ongoing commitment to your health goals. Please review the following plan we discussed and let me know if I can assist you in the future.   Screening recommendations/referrals: Colonoscopy: Up to date next due, 01/21/2029 Recommended yearly ophthalmology/optometry visit for glaucoma screening and checkup Recommended yearly dental visit for hygiene and checkup  Vaccinations: Influenza vaccine: Currently due, you may receive at our office or at your local pharmacy. Pneumococcal vaccine: Completed series Tdap vaccine: Up to date next due, 01/04/2027 Shingles vaccine: Currently due for Shingrix, please contact your pharmacy to discuss cost and to receive vaccines    Advanced directives: Please bring a copy of your Medical Advanced Directives so that we may scan them into your chart.  Conditions/risks identified: None   Next appointment: None  Preventive Care 65 Years and Older, Male Preventive care refers to lifestyle choices and visits with your health care provider that can promote health and wellness. What does preventive care include?  A yearly physical exam. This is also called an annual well check.  Dental exams once or twice a year.  Routine eye exams. Ask your health care provider how often you should have your eyes checked.  Personal lifestyle choices, including:  Daily care of your teeth and gums.  Regular physical activity.  Eating a healthy diet.  Avoiding tobacco and drug use.  Limiting alcohol use.  Practicing safe sex.  Taking low doses of aspirin every day.  Taking vitamin and mineral supplements as recommended by your health care provider. What happens during an annual well check? The services and screenings done by your health care provider during your annual well check will depend on your age, overall health, lifestyle risk factors, and family history of  disease. Counseling  Your health care provider may ask you questions about your:  Alcohol use.  Tobacco use.  Drug use.  Emotional well-being.  Home and relationship well-being.  Sexual activity.  Eating habits.  History of falls.  Memory and ability to understand (cognition).  Work and work Statistician. Screening  You may have the following tests or measurements:  Height, weight, and BMI.  Blood pressure.  Lipid and cholesterol levels. These may be checked every 5 years, or more frequently if you are over 40 years old.  Skin check.  Lung cancer screening. You may have this screening every year starting at age 109 if you have a 30-pack-year history of smoking and currently smoke or have quit within the past 15 years.  Fecal occult blood test (FOBT) of the stool. You may have this test every year starting at age 60.  Flexible sigmoidoscopy or colonoscopy. You may have a sigmoidoscopy every 5 years or a colonoscopy every 10 years starting at age 79.  Prostate cancer screening. Recommendations will vary depending on your family history and other risks.  Hepatitis C blood test.  Hepatitis B blood test.  Sexually transmitted disease (STD) testing.  Diabetes screening. This is done by checking your blood sugar (glucose) after you have not eaten for a while (fasting). You may have this done every 1-3 years.  Abdominal aortic aneurysm (AAA) screening. You may need this if you are a current or former smoker.  Osteoporosis. You may be screened starting at age 39 if you are at high risk. Talk with your health care provider about your test results, treatment options, and if necessary, the need for more tests. Vaccines  Your health care provider may recommend certain vaccines, such as:  Influenza vaccine. This is recommended every year.  Tetanus, diphtheria, and acellular pertussis (Tdap, Td) vaccine. You may need a Td booster every 10 years.  Zoster vaccine. You may  need this after age 54.  Pneumococcal 13-valent conjugate (PCV13) vaccine. One dose is recommended after age 45.  Pneumococcal polysaccharide (PPSV23) vaccine. One dose is recommended after age 101. Talk to your health care provider about which screenings and vaccines you need and how often you need them. This information is not intended to replace advice given to you by your health care provider. Make sure you discuss any questions you have with your health care provider. Document Released: 08/07/2015 Document Revised: 03/30/2016 Document Reviewed: 05/12/2015 Elsevier Interactive Patient Education  2017 [place] Prevention in the Home Falls can cause injuries. They can happen to people of all ages. There are many things you can do to make your home safe and to help prevent falls. What can I do on the outside of my home?  Regularly fix the edges of walkways and driveways and fix any cracks.  Remove anything that might make you trip as you walk through a door, such as a raised step or threshold.  Trim any bushes or trees on the path to your home.  Use bright outdoor lighting.  Clear any walking paths of anything that might make someone trip, such as rocks or tools.  Regularly check to see if handrails are loose or broken. Make sure that both sides of any steps have handrails.  Any raised decks and porches should have guardrails on the edges.  Have any leaves, snow, or ice cleared regularly.  Use sand or salt on walking paths during winter.  Clean up any spills in your garage right away. This includes oil or grease spills. What can I do in the bathroom?  Use night lights.  Install grab bars by the toilet and in the tub and shower. Do not use towel bars as grab bars.  Use non-skid mats or decals in the tub or shower.  If you need to sit down in the shower, use a plastic, non-slip stool.  Keep the floor dry. Clean up any water that spills on the floor as soon as it  happens.  Remove soap buildup in the tub or shower regularly.  Attach bath mats securely with double-sided non-slip rug tape.  Do not have throw rugs and other things on the floor that can make you trip. What can I do in the bedroom?  Use night lights.  Make sure that you have a light by your bed that is easy to reach.  Do not use any sheets or blankets that are too big for your bed. They should not hang down onto the floor.  Have a firm chair that has side arms. You can use this for support while you get dressed.  Do not have throw rugs and other things on the floor that can make you trip. What can I do in the kitchen?  Clean up any spills right away.  Avoid walking on wet floors.  Keep items that you use a lot in easy-to-reach places.  If you need to reach something above you, use a strong step stool that has a grab bar.  Keep electrical cords out of the way.  Do not use floor polish or wax that makes floors slippery. If you must use wax, use non-skid floor wax.  Do  not have throw rugs and other things on the floor that can make you trip. What can I do with my stairs?  Do not leave any items on the stairs.  Make sure that there are handrails on both sides of the stairs and use them. Fix handrails that are broken or loose. Make sure that handrails are as long as the stairways.  Check any carpeting to make sure that it is firmly attached to the stairs. Fix any carpet that is loose or worn.  Avoid having throw rugs at the top or bottom of the stairs. If you do have throw rugs, attach them to the floor with carpet tape.  Make sure that you have a light switch at the top of the stairs and the bottom of the stairs. If you do not have them, ask someone to add them for you. What else can I do to help prevent falls?  Wear shoes that:  Do not have high heels.  Have rubber bottoms.  Are comfortable and fit you well.  Are closed at the toe. Do not wear sandals.  If you  use a stepladder:  Make sure that it is fully opened. Do not climb a closed stepladder.  Make sure that both sides of the stepladder are locked into place.  Ask someone to hold it for you, if possible.  Clearly mark and make sure that you can see:  Any grab bars or handrails.  First and last steps.  Where the edge of each step is.  Use tools that help you move around (mobility aids) if they are needed. These include:  Canes.  Walkers.  Scooters.  Crutches.  Turn on the lights when you go into a dark area. Replace any light bulbs as soon as they burn out.  Set up your furniture so you have a clear path. Avoid moving your furniture around.  If any of your floors are uneven, fix them.  If there are any pets around you, be aware of where they are.  Review your medicines with your doctor. Some medicines can make you feel dizzy. This can increase your chance of falling. Ask your doctor what other things that you can do to help prevent falls. This information is not intended to replace advice given to you by your health care provider. Make sure you discuss any questions you have with your health care provider. Document Released: 05/07/2009 Document Revised: 12/17/2015 Document Reviewed: 08/15/2014 Elsevier Interactive Patient Education  2017 [employer].

## 2020-04-03 NOTE — Progress Notes (Signed)
Subjective:   Aaron Bradshaw is a 68 y.o. male who presents for Medicare Annual/Subsequent preventive examination.  I connected with Aaron Bradshaw today by telephone and verified that I am speaking with the correct person using two identifiers. Location patient: home Location provider: work Persons participating in the virtual visit: patient, provider.   I discussed the limitations, risks, security and privacy concerns of performing an evaluation and management service by telephone and the availability of in person appointments. I also discussed with the patient that there may be a patient responsible charge related to this service. The patient expressed understanding and verbally consented to this telephonic visit.    Interactive audio and video telecommunications were attempted between this provider and patient, however failed, due to patient having technical difficulties OR patient did not have access to video capability.  We continued and completed visit with audio only.     Review of Systems    N/A Cardiac Risk Factors include: advanced age (>68men, >70 women);diabetes mellitus;dyslipidemia     Objective:    Today's Vitals   There is no height or weight on file to calculate BMI.  Advanced Directives 04/03/2020 11/28/2017 09/25/2015 08/05/2015 07/30/2015 01/08/2015  Does Patient Have a Medical Advance Directive? Yes No No No No No  Type of Paramedic of [place];Living will - - - - -  Copy of [place] in Chart? No - copy requested - - - - -  Would patient like information on creating a medical advance directive? - - Yes - Educational materials given - No - patient declined information No - patient declined information    Current Medications (verified) Outpatient Encounter Medications as of 04/03/2020  Medication Sig   Ascorbic Acid (VITAMIN C) 250 MG tablet Take 250 mg by mouth daily.     b complex vitamins capsule Take 1  capsule by mouth daily.     CHOLECALCIFEROL PO Take 1 tablet by mouth daily.   co-enzyme Q-10 30 MG capsule Take 30 mg by mouth daily.   empagliflozin (JARDIANCE) 25 MG TABS tablet Take 1 tablet (25 mg total) by mouth daily before breakfast.   glucose blood (ONE TOUCH ULTRA TEST) test strip Use once daily.  Dx E11.9   KRILL OIL PO Take 1 capsule by mouth daily.   levothyroxine (SYNTHROID) 75 MCG tablet Take 1 tablet (75 mcg total) by mouth daily.   MAGNESIUM PO Take 1 tablet by mouth daily.   metFORMIN (GLUCOPHAGE-XR) 750 MG 24 hr tablet TAKE 1 TABLET (750 MG TOTAL) BY MOUTH 2 (TWO) TIMES DAILY.   Omega-3 Fatty Acids (FISH OIL) 1000 MG CAPS Take by mouth.   PRESCRIPTION MEDICATION Inject 1 each into the skin every 7 (seven) days. Patient gets an allergy shot once a week at [facility] off of [address]   Probiotic Product (PROBIOTIC DAILY PO) Take by mouth daily.   rosuvastatin (CRESTOR) 10 MG tablet TAKE 1 TABLET BY MOUTH EVERY DAY   Turmeric 500 MG CAPS Take 1,000 mg by mouth daily.   EPIPEN 2-PAK 0.3 MG/0.3ML SOAJ injection USE AS DIRECTED AS NEEDED (Patient not taking: Reported on 04/03/2020)   imiquimod (ALDARA) 5 % cream APPLY 1 APPLICATION APPLY ON THE SKIN AS DIRECTED APPLY EVERY OTHER NIGHT FOR 4 WEEKS. (Patient not taking: Reported on 04/03/2020)   neomycin-polymyxin b-dexamethasone (MAXITROL) 3.5-10000-0.1 SUSP INSTIL 1 DROP 4 TIMES DAILY IN RIGHT EYE FOR 5 DAYS THEN 2 TIMES DAILY FOR 5 DAYS   [DISCONTINUED] meloxicam (MOBIC)  15 MG tablet Take 1 tablet daily with food for 7 days. Then take as needed.   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   Diabetes mellitus ([place]) 6/12    A1c 7.1 %   dx 4 yrs ago   [name] syndrome    Heart murmur    'when I was younger"   History of chest pain    Hospitalized ER in [place] for CP and Palpitations with Neg Enzymes   Hyperlipidemia    Shingles    2015   Testosterone deficiency    Dr Hartley Barefoot   Thyroid disease    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   Past Surgical History:  Procedure Laterality Date   APPENDECTOMY     CHOLECYSTECTOMY  2010   COLONOSCOPY     Tics, Polyp 12/26/2003; polyps 2010, Dr Carlean Purl   KNEE ARTHROSCOPY     Left   LAPAROSCOPIC APPENDECTOMY N/A 10/05/2015   Procedure: APPENDECTOMY LAPAROSCOPIC;  Surgeon: Judeth Horn, MD;  Location: [facility];  Service: General;  Laterality: N/A;   NOSE SURGERY     Dr  Truman Hayward   POLYPECTOMY     ROTATOR CUFF REPAIR Bilateral    left   SHOULDER ARTHROSCOPY     Family History  Problem Relation Age of Onset   Depression Father    Heart disease Father        MI in 40s   Prostate cancer Father    OCD Father    Depression Mother    Arthritis Mother    Stroke Mother 40   Parkinsonism Mother    Diabetes Mother    Depression Sister        OCD   Prostate cancer Paternal Grandfather    Diabetes Maternal Grandfather        MI in 34s   Colon cancer Neg Hx    Colon polyps Neg Hx    Esophageal cancer Neg Hx    Rectal cancer Neg Hx    Stomach cancer Neg Hx    Social History   Socioeconomic History   Marital status: Married    Spouse name: Not on file   Number of children: Not on file   Years of education: Not on file   Highest education level: Not on file  Occupational History   Not on file  Tobacco Use   Smoking status: Never Smoker   Smokeless tobacco: Never Used  Vaping Use   Vaping Use: Never used  Substance and Sexual Activity   Alcohol use: Yes    Alcohol/week: 1.0 - 2.0 standard drink    Types: 1 - 2 Standard drinks or equivalent per week    Comment: beer occ stopped wine   Drug use: No   Sexual activity:  Not on file  Other Topics Concern   Not on file  Social History Narrative   Not on file   Social Determinants of Health   Financial Resource Strain: Low Risk    Difficulty of Paying Living Expenses: Not hard at all  Food Insecurity: No Food Insecurity   Worried About Estate manager/land agent of Food in the Last Year: Never true  Ran Out of Food in the Last Year: Never true  Transportation Needs: No Transportation Needs   Lack of Transportation (Medical): No   Lack of Transportation (Non-Medical): No  Physical Activity: Sufficiently Active   Days of Exercise per Week: 7 days   Minutes of Exercise per Session: 60 min  Stress: No Stress Concern Present   Feeling of Stress : Not at all  Social Connections: Socially Integrated   Frequency of Communication with Friends and Family: More than three times a week   Frequency of Social Gatherings with Friends and Family: More than three times a week   Attends Religious Services: More than 4 times per year   Active Member of [employer] or Organizations: Yes   Attends Music therapist: More than 4 times per year   Marital Status: Married    Tobacco Counseling Counseling given: Not Answered   Clinical Intake:  Pre-visit preparation completed: Yes  Pain : No/denies pain     Diabetes: Yes (Patient states checks glucose twice a week) CBG done?: No Did pt. bring in CBG monitor from home?: No  How often do you need to have someone help you when you read instructions, pamphlets, or other written materials from your doctor or pharmacy?: 1 - Never What is the last grade level you completed in school?: BA Degree  Diabetic?yes  Interpreter Needed?: No  Information entered by :: [place] of Daily Living In your present state of health, do you have any difficulty performing the following activities: 04/03/2020  Hearing? N  Vision? N  Walking or climbing stairs? N  Dressing or bathing? N  Doing errands,  shopping? N  Preparing Food and eating ? N  Using the Toilet? N  In the past six months, have you accidently leaked urine? N  Do you have problems with loss of bowel control? N  Managing your Medications? N  Managing your Finances? N  Housekeeping or managing your Housekeeping? N  Some recent data might be hidden    Patient Care Team: Eulas Post, MD as PCP - General (Family Medicine)  Indicate any recent Medical Services you may have received from other than [facility] providers in the past year (date may be approximate).     Assessment:   This is a routine wellness examination for Azariel.  Hearing/Vision screen  Hearing Screening   125Hz  250Hz  500Hz  1000Hz  2000Hz  3000Hz  4000Hz  6000Hz  8000Hz   Right ear:           Left ear:           Vision Screening Comments: Patient states gets his eye checked annually   Dietary issues and exercise activities discussed: Current Exercise Habits: Home exercise routine, Type of exercise: strength training/weights;Other - see comments (swimming), Time (Minutes): 60, Frequency (Times/Week): 5, Weekly Exercise (Minutes/Week): 300, Intensity: Moderate  Goals     Patient Stated     Lose down to 215lb Continue to monitor your BS Continue your great exercise plan!        Depression Screen PHQ 2/9 Scores 04/03/2020 12/09/2019 12/04/2018 11/28/2017 10/10/2017 10/10/2016  PHQ - 2 Score 0 0 0 0 0 0  PHQ- 9 Score 0 - - - - -    Fall Risk Fall Risk  04/03/2020 12/09/2019 12/04/2018 11/28/2017 10/10/2017  Falls in the past year? 0 1 0 Yes Yes  Number falls in past yr: 0 0 - 1 1  Injury with Fall? 0 0 - Yes Yes  Risk for fall  due to : Medication side effect History of fall(s) - - -  Follow up Falls evaluation completed;Falls prevention discussed Falls evaluation completed - Education provided -  Comment - - - last year rotator cuff  -    Any stairs in or around the home? No  If so, are there any without handrails? No  Home free of loose throw rugs in  walkways, pet beds, electrical cords, etc? Yes  Adequate lighting in your home to reduce risk of falls? Yes   ASSISTIVE DEVICES UTILIZED TO PREVENT FALLS:  Life alert? No  Use of a cane, walker or w/c? No  Grab bars in the bathroom? Yes  Shower chair or bench in shower? Yes  Elevated toilet seat or a handicapped toilet? No   TCognitive Function: MMSE - Mini Mental State Exam 11/28/2017  Not completed: (No Data)        Immunizations Immunization History  Administered Date(s) Administered   Fluad Quad(high Dose 65+) 05/14/2019   Influenza Whole 05/25/2012   Influenza, High Dose Seasonal PF 04/12/2017   Influenza,inj,Quad PF,6+ Mos 04/17/2013, 07/02/2015, 04/18/2016, 04/13/2018   PFIZER SARS-COV-2 Vaccination 09/01/2019, 09/25/2019   Pneumococcal Conjugate-13 01/03/2017, 01/10/2018   Pneumococcal Polysaccharide-23 05/16/2013   Tdap 01/03/2017   Zoster 05/14/2014    TDAP status: Up to date Flu Vaccine status: Up to date Pneumococcal vaccine status: Up to date Covid-19 vaccine status: Completed vaccines  Qualifies for Shingles Vaccine? Yes   Zostavax completed Yes   Shingrix Completed?: No.    Education has been provided regarding the importance of this vaccine. Patient has been advised to call insurance company to determine out of pocket expense if they have not yet received this vaccine. Advised may also receive vaccine at local pharmacy or Health Dept. Verbalized acceptance and understanding.  Screening Tests Health Maintenance  Topic Date Due   PNA vac Low Risk Adult (2 of 2 - PPSV23) 01/11/2019   INFLUENZA VACCINE  02/23/2020   OPHTHALMOLOGY EXAM  04/07/2020   FOOT EXAM  06/06/2020   HEMOGLOBIN A1C  09/10/2020   URINE MICROALBUMIN  03/10/2021   COLONOSCOPY  01/22/2024   TETANUS/TDAP  01/04/2027   COVID-19 Vaccine  Completed   Hepatitis C Screening  Completed    Health Maintenance  Health Maintenance Due  Topic Date Due   PNA vac Low Risk  Adult (2 of 2 - PPSV23) 01/11/2019   INFLUENZA VACCINE  02/23/2020    Colorectal cancer screening: Completed 01/22/2019. Repeat every 10 years  Lung Cancer Screening: (Low Dose CT Chest recommended if Age 45-80 years, 30 pack-year currently smoking OR have quit w/in 15years.) does not qualify.   Lung Cancer Screening Referral: N/A  Additional Screening:  Hepatitis C Screening: does qualify; Completed 12/02/2015  Vision Screening: Recommended annual ophthalmology exams for early detection of glaucoma and other disorders of the eye. Is the patient up to date with their annual eye exam?  Yes  Who is the provider or what is the name of the office in which the patient attends annual eye exams? Dr.Groat If pt is not established with a provider, would they like to be referred to a provider to establish care? No .   Dental Screening: Recommended annual dental exams for proper oral hygiene  Community Resource Referral / Chronic Care Management: CRR required this visit?  No   CCM required this visit?  No      Plan:     I have personally reviewed and noted the following in the patients  chart:    Medical and social history  Use of alcohol, tobacco or illicit drugs   Current medications and supplements  Functional ability and status  Nutritional status  Physical activity  Advanced directives  List of other physicians  Hospitalizations, surgeries, and ER visits in previous 12 months  Vitals  Screenings to include cognitive, depression, and falls  Referrals and appointments  In addition, I have reviewed and discussed with patient certain preventive protocols, quality metrics, and best practice recommendations. A written personalized care plan for preventive services as well as general preventive health recommendations were provided to patient.     Ofilia Neas, LPN   [id number]/[id number]   Nurse Notes: None

## 2020-04-09 LAB — HM DIABETES EYE EXAM

## 2020-04-23 ENCOUNTER — Other Ambulatory Visit: Payer: Self-pay | Admitting: Family Medicine

## 2020-06-24 ENCOUNTER — Encounter (INDEPENDENT_AMBULATORY_CARE_PROVIDER_SITE_OTHER): Payer: Medicare Other | Admitting: Ophthalmology

## 2020-06-24 ENCOUNTER — Other Ambulatory Visit: Payer: Self-pay

## 2020-06-24 DIAGNOSIS — E113392 Type 2 diabetes mellitus with moderate nonproliferative diabetic retinopathy without macular edema, left eye: Secondary | ICD-10-CM

## 2020-06-24 DIAGNOSIS — H33021 Retinal detachment with multiple breaks, right eye: Secondary | ICD-10-CM | POA: Diagnosis not present

## 2020-06-24 DIAGNOSIS — H43813 Vitreous degeneration, bilateral: Secondary | ICD-10-CM

## 2020-06-24 DIAGNOSIS — H353111 Nonexudative age-related macular degeneration, right eye, early dry stage: Secondary | ICD-10-CM

## 2020-06-24 DIAGNOSIS — E11319 Type 2 diabetes mellitus with unspecified diabetic retinopathy without macular edema: Secondary | ICD-10-CM | POA: Diagnosis not present

## 2020-06-26 IMAGING — DX DG CHEST 2V
2 series · 2 of 2 positions shown · non-contrast
Comparison: 09/25/2015 chest radiograph.

CLINICAL DATA: Abnormal prominence of the medial left clavicle.

EXAM:
CHEST - 2 VIEW

[chest pa]
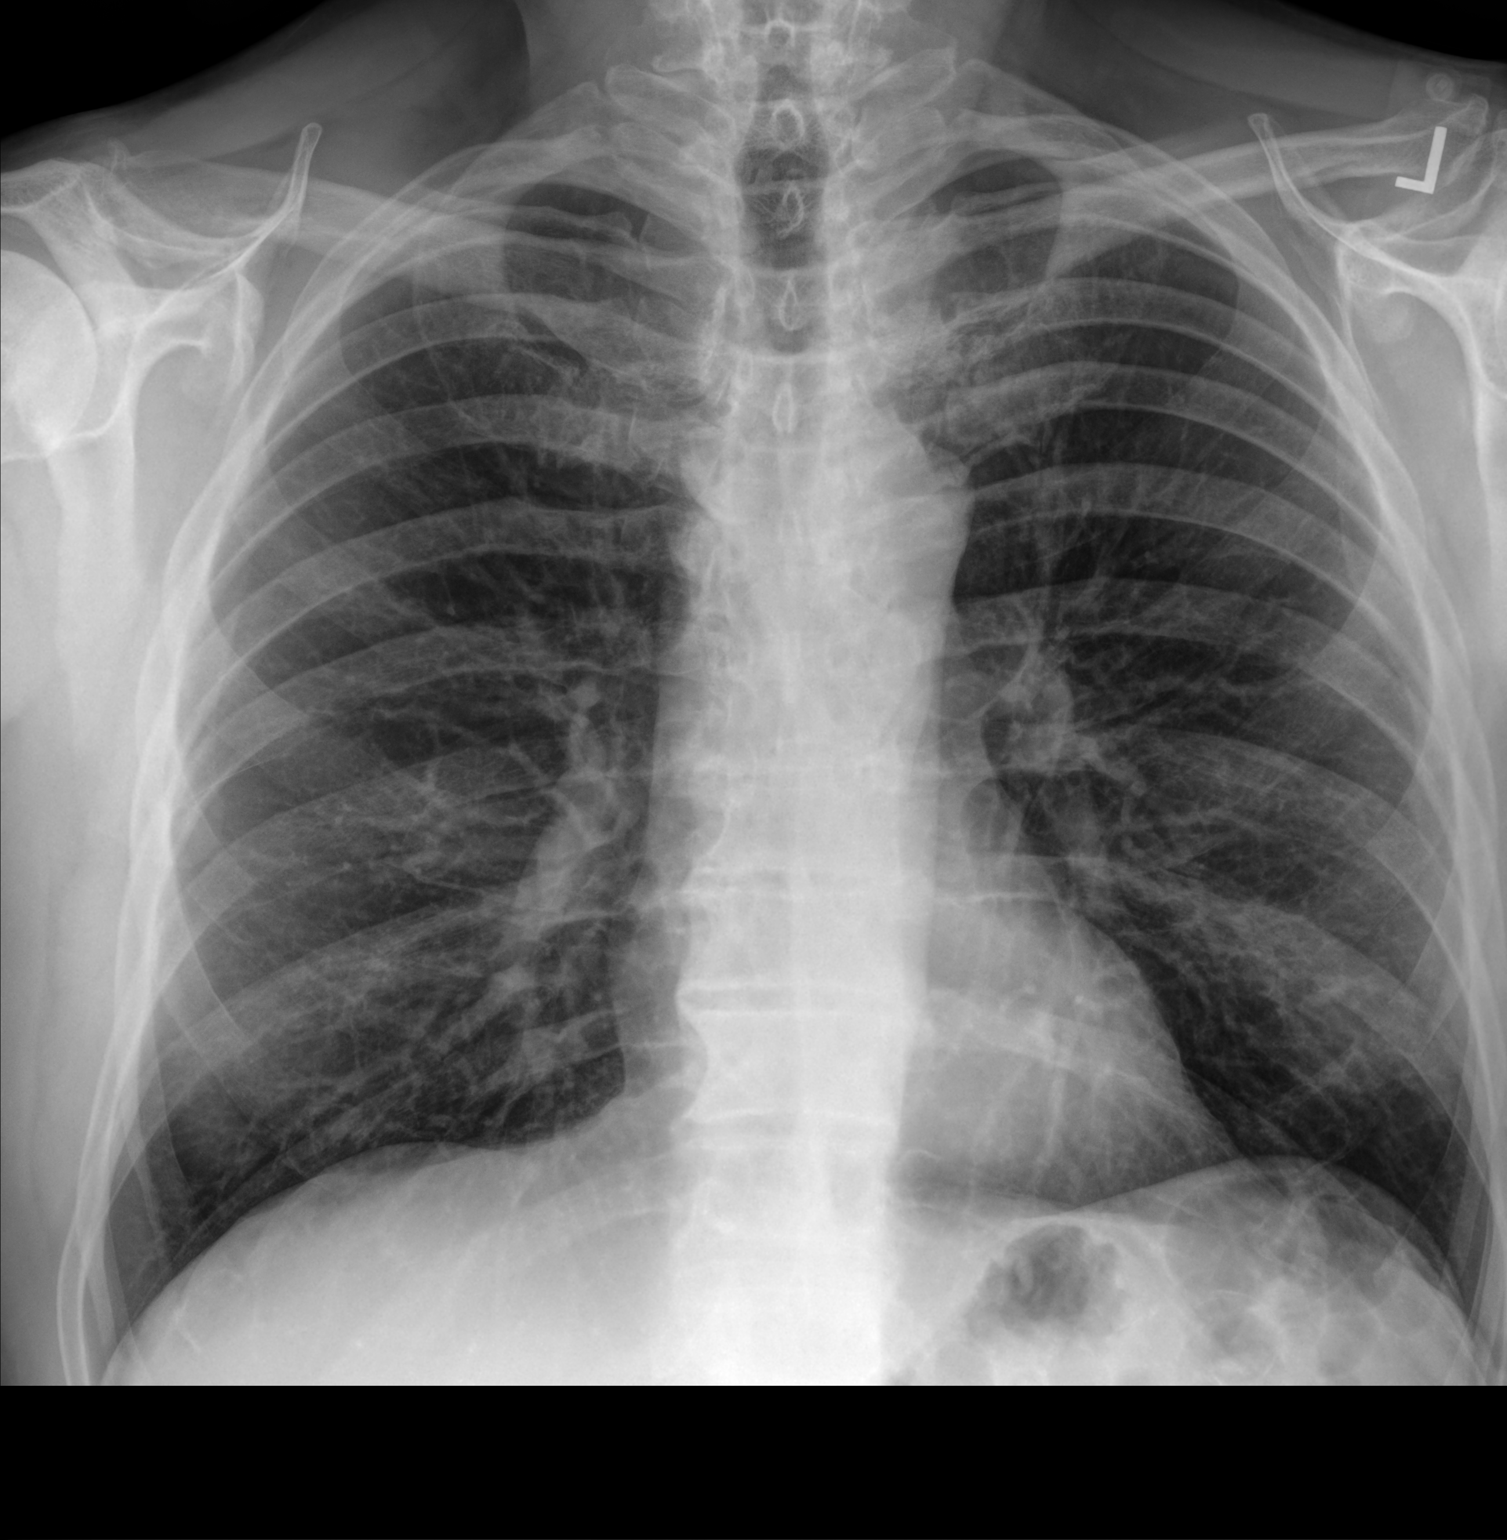

[chest lat]
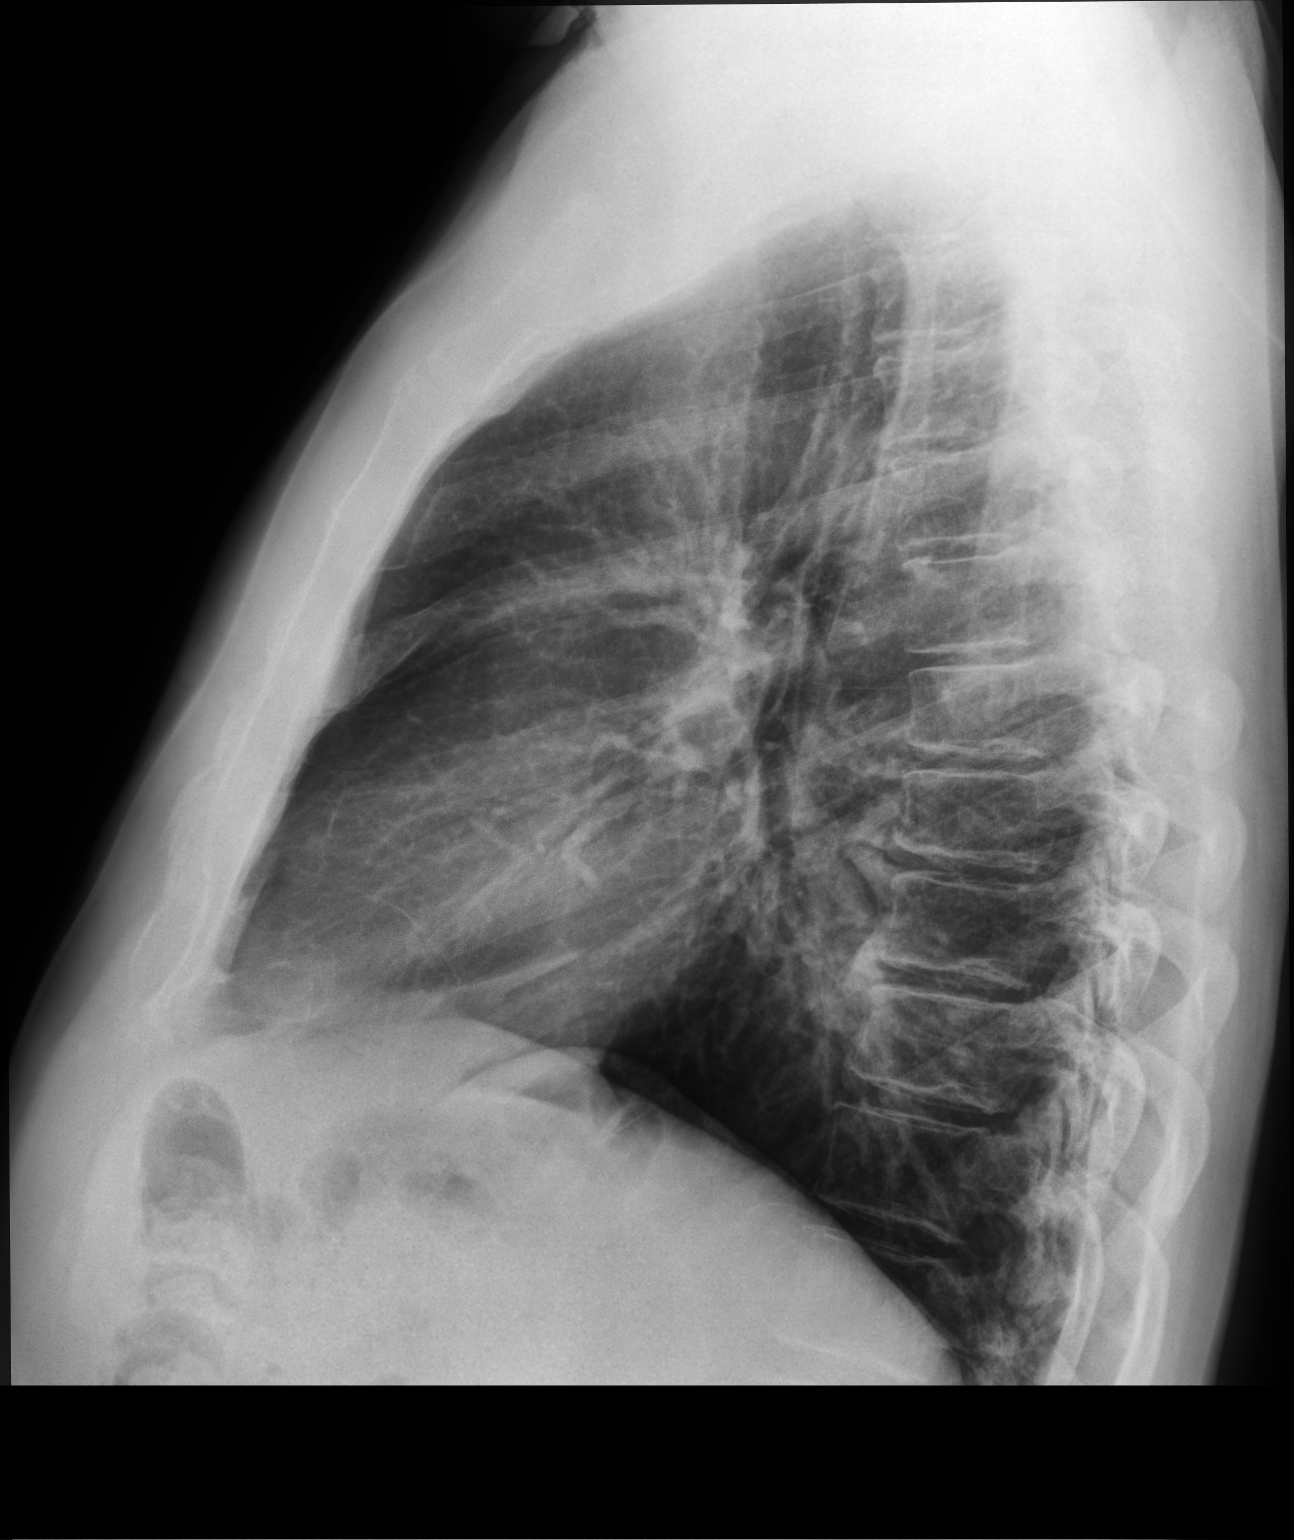

[2 of 2 positions shown; findings below may reference images not displayed]

FINDINGS: Stable cardiomediastinal silhouette with normal heart size. No
pneumothorax. No pleural effusion. Lungs appear clear, with no acute
consolidative airspace disease and no pulmonary edema. No displaced
fractures or suspicious focal osseous lesions.
IMPRESSION: No active cardiopulmonary disease.

## 2020-06-26 IMAGING — DX DG CLAVICLE*L*
2 series · 2 of 2 positions shown · non-contrast
Comparison: None.

CLINICAL DATA: Prominence of the left clavicle. No reported injury.

EXAM:
LEFT CLAVICLE - 2+ VIEWS

[clavicle ap (1 of 2)]
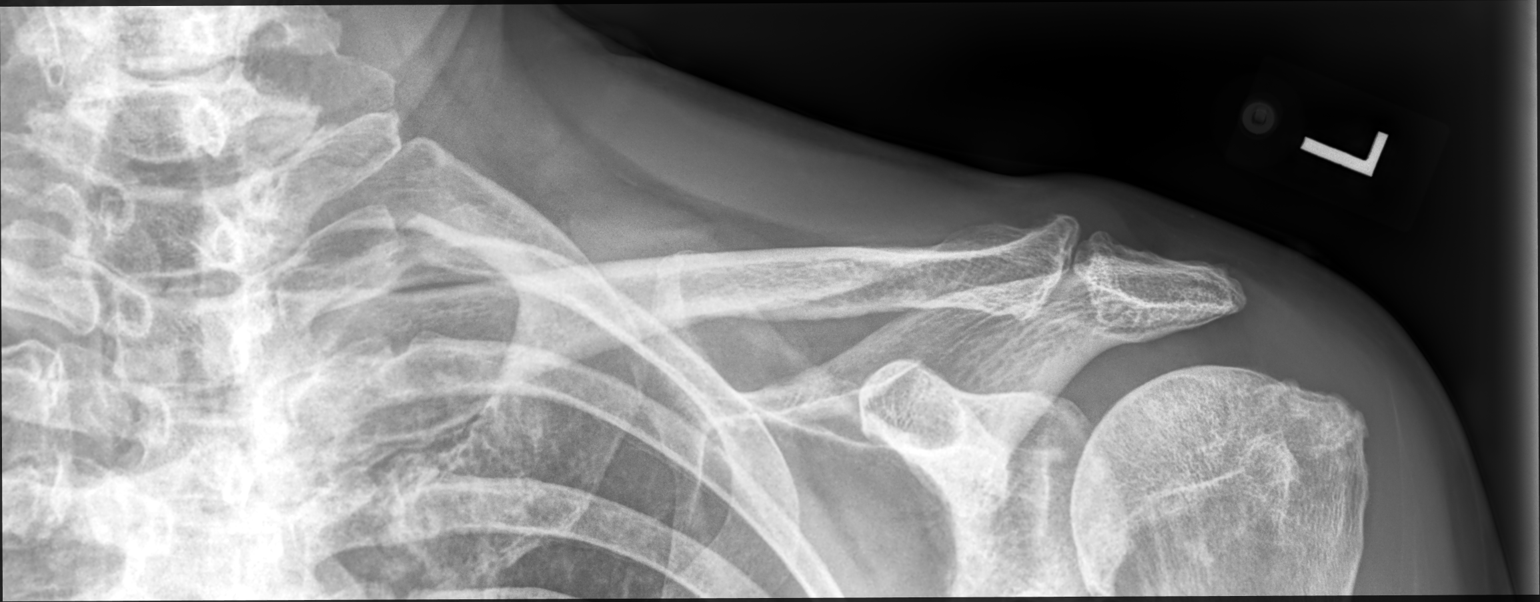

[clavicle ap (2 of 2)]
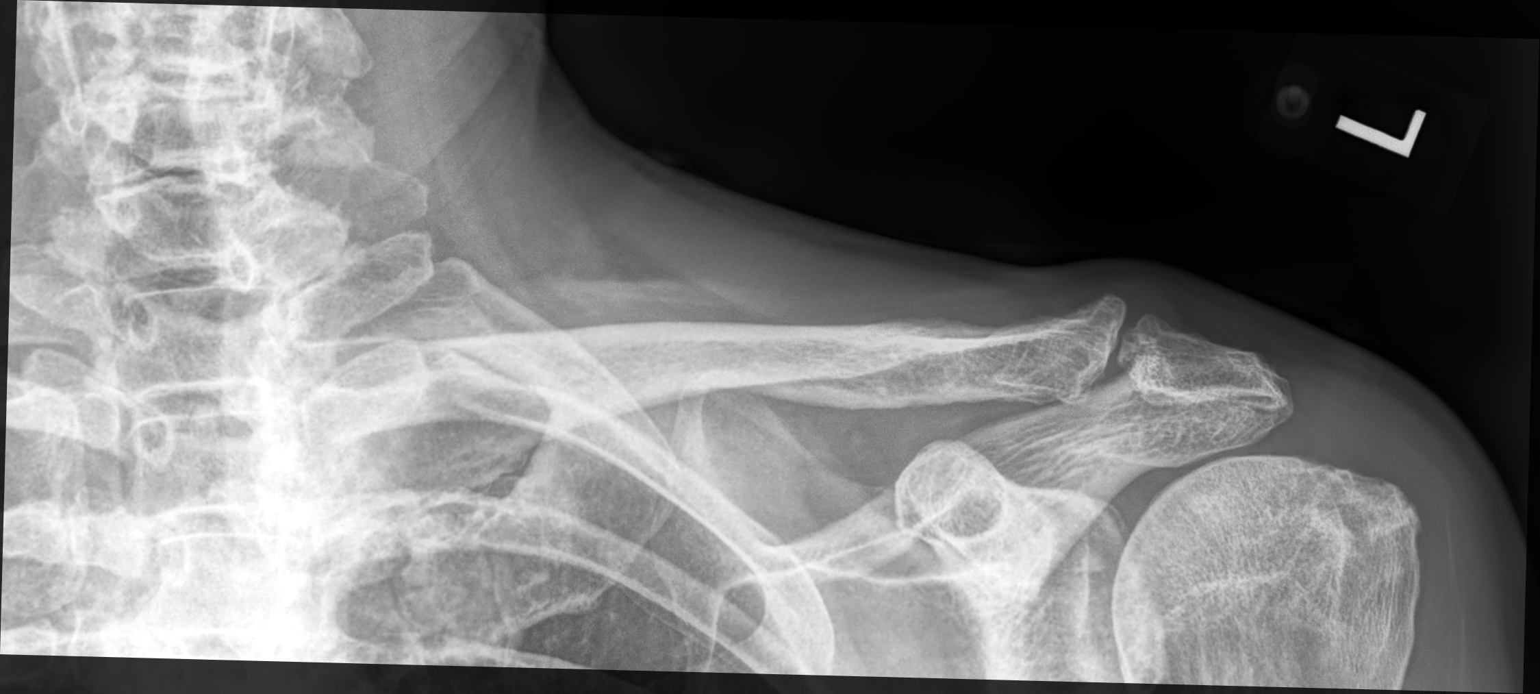

[2 of 2 positions shown; findings below may reference images not displayed]

FINDINGS: No fracture. No suspicious focal osseous lesions. Mild
osteoarthritis in the left acromioclavicular joint. No radiopaque
foreign body.
IMPRESSION: No fracture. No suspicious focal osseous lesions. Mild left
acromioclavicular joint osteoarthritis.

## 2020-06-29 ENCOUNTER — Telehealth: Payer: Self-pay | Admitting: Family Medicine

## 2020-06-29 NOTE — Telephone Encounter (Signed)
Patient is calling is requesting for a new glucose blood (ONE TOUCH ULTRA TEST) test strip sent to  [id number] - [place], [place]  [address] [place], [place] [address]  Phone:  [phone] Fax:  [phone] CB is [phone]

## 2020-06-30 MED ORDER — GLUCOSE BLOOD VI STRP
ORAL_STRIP | 3 refills | Status: DC
Start: 2020-06-30 — End: 2022-05-18

## 2020-06-30 NOTE — Telephone Encounter (Signed)
Rx sent in

## 2020-07-01 ENCOUNTER — Other Ambulatory Visit: Payer: Self-pay

## 2020-07-01 ENCOUNTER — Encounter (INDEPENDENT_AMBULATORY_CARE_PROVIDER_SITE_OTHER): Payer: Self-pay | Admitting: Ophthalmology

## 2020-07-01 DIAGNOSIS — H33301 Unspecified retinal break, right eye: Secondary | ICD-10-CM

## 2020-07-05 ENCOUNTER — Other Ambulatory Visit: Payer: Self-pay | Admitting: Family Medicine

## 2020-07-08 ENCOUNTER — Other Ambulatory Visit: Payer: Self-pay | Admitting: Family Medicine

## 2020-07-20 ENCOUNTER — Other Ambulatory Visit: Payer: Self-pay

## 2020-07-20 MED ORDER — ONETOUCH VERIO W/DEVICE KIT: PACK | 0 refills | Status: DC

## 2020-10-08 ENCOUNTER — Other Ambulatory Visit: Payer: Self-pay | Admitting: Family Medicine

## 2020-10-30 ENCOUNTER — Encounter (INDEPENDENT_AMBULATORY_CARE_PROVIDER_SITE_OTHER): Payer: Medicare Other | Admitting: Ophthalmology

## 2020-10-30 ENCOUNTER — Other Ambulatory Visit: Payer: Self-pay

## 2020-10-30 DIAGNOSIS — H43813 Vitreous degeneration, bilateral: Secondary | ICD-10-CM | POA: Diagnosis not present

## 2020-10-30 DIAGNOSIS — H33301 Unspecified retinal break, right eye: Secondary | ICD-10-CM | POA: Diagnosis not present

## 2020-10-30 DIAGNOSIS — H353111 Nonexudative age-related macular degeneration, right eye, early dry stage: Secondary | ICD-10-CM | POA: Diagnosis not present

## 2020-11-24 ENCOUNTER — Encounter: Payer: Self-pay | Admitting: Family Medicine

## 2020-11-24 ENCOUNTER — Other Ambulatory Visit: Payer: Self-pay

## 2020-11-24 ENCOUNTER — Ambulatory Visit: Payer: Medicare Other | Admitting: Family Medicine

## 2020-11-24 VITALS — BP 130/78 | HR 66 | Temp 98.2°F | Wt 207.8 lb

## 2020-11-24 DIAGNOSIS — E039 Hypothyroidism, unspecified: Secondary | ICD-10-CM | POA: Diagnosis not present

## 2020-11-24 DIAGNOSIS — E785 Hyperlipidemia, unspecified: Secondary | ICD-10-CM | POA: Diagnosis not present

## 2020-11-24 DIAGNOSIS — E119 Type 2 diabetes mellitus without complications: Secondary | ICD-10-CM

## 2020-11-24 LAB — LIPID PANEL
Cholesterol: 159 mg/dL (ref 0–200)
HDL: 38.6 mg/dL — ABNORMAL LOW (ref >39.00)
LDL Cholesterol: 84 mg/dL (ref 0–99)
NonHDL: 120.53
Total CHOL/HDL Ratio: 4
Triglycerides: 181 mg/dL — ABNORMAL HIGH (ref 0.0–149.0)
VLDL: 36.2 mg/dL (ref 0.0–40.0)

## 2020-11-24 LAB — BASIC METABOLIC PANEL
BUN: 27 mg/dL — ABNORMAL HIGH (ref 6–23)
CO2: 28 mEq/L (ref 19–32)
Calcium: 9.6 mg/dL (ref 8.4–10.5)
Chloride: 101 mEq/L (ref 96–112)
Creatinine, Ser: 0.94 mg/dL (ref 0.40–1.50)
GFR: 82.96 mL/min (ref >60.00)
Glucose, Bld: 203 mg/dL — ABNORMAL HIGH (ref 70–99)
Potassium: 4.3 mEq/L (ref 3.5–5.1)
Sodium: 138 mEq/L (ref 135–145)

## 2020-11-24 LAB — POCT GLYCOSYLATED HEMOGLOBIN (HGB A1C): Hemoglobin A1C: 8 % — AB (ref 4.0–5.6)

## 2020-11-24 LAB — HEPATIC FUNCTION PANEL
ALT: 18 U/L (ref 0–53)
AST: 18 U/L (ref 0–37)
Albumin: 4.4 g/dL (ref 3.5–5.2)
Alkaline Phosphatase: 79 U/L (ref 39–117)
Bilirubin, Direct: 0.2 mg/dL (ref 0.0–0.3)
Total Bilirubin: 1.4 mg/dL — ABNORMAL HIGH (ref 0.2–1.2)
Total Protein: 7 g/dL (ref 6.0–8.3)

## 2020-11-24 LAB — TSH: TSH: 3.28 u[IU]/mL (ref 0.35–4.50)

## 2020-11-24 MED ORDER — RYBELSUS 3 MG PO TABS: 3.0000 mg | ORAL_TABLET | Freq: Every day | ORAL | 5 refills | Status: DC

## 2020-11-24 NOTE — Progress Notes (Signed)
Established Patient Office Visit  Subjective:  Patient ID: Aaron Bradshaw, male    DOB: March 20, 1951  Age: 70 y.o. MRN: [id number]  CC:  Chief Complaint  Patient presents with  . Follow-up    diabetes    HPI Aaron Bradshaw presents for medical follow-up.  He has history of hypothyroidism, type 2 diabetes, dyslipidemia.  His diabetic regimen includes Jardiance and Crestor.  Compliant with medications.  Last A1c was 8.3%.  He has not been swimming as much recently but is still doing weights and exercising regularly.  Recent fasting blood sugars have generally ranged around 160-180.  He is getting regular eye exams.  No significant neuropathy symptoms.  We encouraged him to consider additional medication last visit but he preferred lifestyle modification instead.  He takes levothyroxine 75 mcg daily for hypothyroidism.  Due for follow-up labs.  Takes Crestor 10 mg daily for hyperlipidemia.  No recent chest pains.  No cardiac history.  Past Medical History:  Diagnosis Date  . Allergy    allergy shots every 3 weeks   . Anxiety    past hx   . 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 Diabetes mellitus ([place]) 6/12   A1c 7.1 %   dx 4 yrs ago  . Gilbert syndrome   . Heart murmur    'when I was younger"  . History of chest pain    Hospitalized ER in [place] for CP and Palpitations with Neg Enzymes  . Hyperlipidemia   . Shingles    2015  . Testosterone deficiency    Dr Hartley Barefoot  . Thyroid disease   . 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 APPENDECTOMY    . CHOLECYSTECTOMY  2010  . COLONOSCOPY     Tics, Polyp 12/26/2003; polyps 2010, Dr Carlean Purl  . KNEE ARTHROSCOPY     Left  . LAPAROSCOPIC APPENDECTOMY N/A 10/05/2015   Procedure: APPENDECTOMY LAPAROSCOPIC;  Surgeon: Judeth Horn, MD;  Location: [facility];  Service: General;  Laterality: N/A;  . NOSE SURGERY     Dr  Truman Hayward  . POLYPECTOMY    . ROTATOR CUFF REPAIR Bilateral     left  . SHOULDER ARTHROSCOPY      Family History  Problem Relation Age of Onset  . Depression Father   . Heart disease Father        MI in 55s  . Prostate cancer Father   . OCD Father   . Depression Mother   . Arthritis Mother   . Stroke Mother 64  . Parkinsonism Mother   . Diabetes Mother   . Depression Sister        OCD  . Prostate cancer Paternal Grandfather   . Diabetes Maternal Grandfather        MI in 31s  . Colon cancer Neg Hx   . Colon polyps Neg Hx   . Esophageal cancer Neg Hx   . Rectal cancer Neg Hx   . Stomach cancer Neg Hx     Social History   Socioeconomic History  . Marital status: Married    Spouse name: Not on file  . Number of children: Not on file  . Years of education: Not on file  . Highest education level: Not on file  Occupational History  . Not on file  Tobacco Use  . Smoking status: Never Smoker  . Smokeless tobacco: Never Used  Vaping Use  .  Vaping Use: Never used  Substance and Sexual Activity  . Alcohol use: Yes    Alcohol/week: 1.0 - 2.0 standard drink    Types: 1 - 2 Standard drinks or equivalent per week    Comment: beer occ stopped wine  . Drug use: No  . Sexual activity: Not on file  Other Topics Concern  . Not on file  Social History Narrative  . Not on file   Social Determinants of Health   Financial Resource Strain: Low Risk   . Difficulty of Paying Living Expenses: Not hard at all  Food Insecurity: No Food Insecurity  . Worried About Charity fundraiser in the Last Year: Never true  . Ran Out of Food in the Last Year: Never true  Transportation Needs: No Transportation Needs  . Lack of Transportation (Medical): No  . Lack of Transportation (Non-Medical): No  Physical Activity: Sufficiently Active  . Days of Exercise per Week: 7 days  . Minutes of Exercise per Session: 60 min  Stress: No Stress Concern Present  . Feeling of Stress : Not at all  Social Connections: Socially Integrated  . Frequency of  Communication with Friends and Family: More than three times a week  . Frequency of Social Gatherings with Friends and Family: More than three times a week  . Attends Religious Services: More than 4 times per year  . Active Member of Clubs or Organizations: Yes  . Attends Archivist Meetings: More than 4 times per year  . Marital Status: Married  Human resources officer Violence: Not At Risk  . Fear of Current or Ex-Partner: No  . Emotionally Abused: No  . Physically Abused: No  . Sexually Abused: No    Outpatient Medications Prior to Visit  Medication Sig Dispense Refill  . Ascorbic Acid (VITAMIN C) 250 MG tablet Take 250 mg by mouth daily.    Marland Kitchen b complex vitamins capsule Take 1 capsule by mouth daily.    . Blood Glucose Monitoring Suppl (ONETOUCH VERIO) w/Device KIT Use 1-4 times daily as needed/directed DX E11.9 1 kit 0  . CHOLECALCIFEROL PO Take 1 tablet by mouth daily.    Marland Kitchen co-enzyme Q-10 30 MG capsule Take 30 mg by mouth daily.    . empagliflozin (JARDIANCE) 25 MG TABS tablet Take 1 tablet (25 mg total) by mouth daily before breakfast. 30 tablet 11  . EPIPEN 2-PAK 0.3 MG/0.3ML SOAJ injection USE AS DIRECTED AS NEEDED  1  . glucose blood (ONE TOUCH ULTRA TEST) test strip Use once daily.  Dx E11.9 100 each 3  . imiquimod (ALDARA) 5 % cream APPLY 1 APPLICATION APPLY ON THE SKIN AS DIRECTED APPLY EVERY OTHER NIGHT FOR 4 WEEKS.    Marland Kitchen KRILL OIL PO Take 1 capsule by mouth daily.    Marland Kitchen levothyroxine (SYNTHROID) 75 MCG tablet TAKE 1 TABLET BY MOUTH EVERY DAY 90 tablet 1  . MAGNESIUM PO Take 1 tablet by mouth daily.    . metFORMIN (GLUCOPHAGE-XR) 750 MG 24 hr tablet TAKE 1 TABLET BY MOUTH 2 TIMES DAILY. 180 tablet 0  . neomycin-polymyxin b-dexamethasone (MAXITROL) 3.5-10000-0.1 SUSP INSTIL 1 DROP 4 TIMES DAILY IN RIGHT EYE FOR 5 DAYS THEN 2 TIMES DAILY FOR 5 DAYS    . Omega-3 Fatty Acids (FISH OIL) 1000 MG CAPS Take by mouth.    Marland Kitchen PRESCRIPTION MEDICATION Inject 1 each into the skin every 7  (seven) days. Patient gets an allergy shot once a week at [place] off [address]    .  Probiotic Product (PROBIOTIC DAILY PO) Take by mouth daily.    . rosuvastatin (CRESTOR) 10 MG tablet TAKE 1 TABLET BY MOUTH EVERY DAY 90 tablet 1  . Turmeric 500 MG CAPS Take 1,000 mg by mouth daily.     Facility-Administered Medications Prior to Visit  Medication Dose Route Frequency Provider Last Rate Last Admin  . 0.9 %  sodium chloride infusion  500 mL Intravenous Once Gatha Mayer, MD        Allergies  Allergen Reactions  . Codeine Nausea Only  . Prozac [Fluoxetine Hcl] Anxiety    Increased anxiety    ROS Review of Systems  Constitutional: Negative for appetite change, fatigue and unexpected weight change.  Eyes: Negative for visual disturbance.  Respiratory: Negative for cough, chest tightness and shortness of breath.   Cardiovascular: Negative for chest pain, palpitations and leg swelling.  Gastrointestinal: Negative for abdominal pain.  Endocrine: Negative for polydipsia and polyuria.  Genitourinary: Negative for dysuria.  Neurological: Negative for dizziness, syncope, weakness, light-headedness and headaches.      Objective:    Physical Exam Vitals reviewed.  Constitutional:      Appearance: Normal appearance.  Cardiovascular:     Rate and Rhythm: Normal rate and regular rhythm.  Pulmonary:     Effort: Pulmonary effort is normal.     Breath sounds: Normal breath sounds.  Musculoskeletal:     Right lower leg: No edema.     Left lower leg: No edema.  Skin:    Comments: Feet reveal no skin lesions. Good distal foot pulses. Good capillary refill. No calluses. Normal sensation with monofilament testing   Neurological:     Mental Status: He is alert.     BP 130/78 (BP Location: Left Arm, Cuff Size: Normal)   Pulse 66   Temp 98.2 F (36.8 C) (Oral)   Wt 207 lb 12.8 oz (94.3 kg)   SpO2 98%   BMI 25.97 kg/m  Wt Readings from Last 3 Encounters:  11/24/20 207 lb 12.8  oz (94.3 kg)  03/10/20 212 lb (96.2 kg)  02/18/20 (!) 210 lb (95.3 kg)     Health Maintenance Due  Topic Date Due  . COVID-19 Vaccine (4 - Booster for [place] series) 05/13/2020  . FOOT EXAM  06/06/2020    There are no preventive care reminders to display for this patient.  Lab Results  Component Value Date   TSH 2.58 03/10/2020   Lab Results  Component Value Date   WBC 7.5 03/27/2018   HGB 15.8 03/27/2018   HCT 44.7 03/27/2018   MCV 87.2 03/27/2018   PLT 193.0 03/27/2018   Lab Results  Component Value Date   NA 136 01/02/2020   K 4.0 01/02/2020   CO2 28 01/02/2020   GLUCOSE 166 (H) 01/02/2020   BUN 22 01/02/2020   CREATININE 0.90 01/02/2020   BILITOT 0.9 01/02/2020   ALKPHOS 78 01/02/2020   AST 18 01/02/2020   ALT 18 01/02/2020   PROT 7.2 01/02/2020   ALBUMIN 4.3 01/02/2020   CALCIUM 8.9 01/02/2020   ANIONGAP 9 09/25/2015   GFR 83.87 01/02/2020   Lab Results  Component Value Date   CHOL 187 01/02/2020   Lab Results  Component Value Date   HDL 37.40 (L) 01/02/2020   Lab Results  Component Value Date   LDLCALC 118 (H) 01/02/2020   Lab Results  Component Value Date   TRIG 160.0 (H) 01/02/2020   Lab Results  Component Value Date   CHOLHDL  5 01/02/2020   Lab Results  Component Value Date   HGBA1C 8.0 (A) 11/24/2020      Assessment & Plan:   #1 type 2 diabetes.  Remains poorly controlled with A1c 8.0%.  Currently on Jardiance 25 mg daily and metformin.  Patient was somewhat equivocal about starting additional medication.  We have strongly advise he consider GLP-1 medication.  He has no history of pancreatitis.  No known family history of medullary thyroid cancer.  We discussed multiple options.  He prefers pill option of possible.  -He agreed to start Rybelsus 3 mg daily.  We explained that we usually titrate this in 1 month to 7 mg but he would like to stay on this dose until follow-up in 3 months and reassess.  Will likely need further titration.   Reviewed potential side effects  #2 dyslipidemia treated with Crestor  -Recheck lipid and hepatic panel  #3 hypothyroidism on replacement with levothyroxine -Recheck TSH today  Meds ordered this encounter  Medications  . Semaglutide (RYBELSUS) 3 MG TABS    Sig: Take 3 mg by mouth daily.    Dispense:  30 tablet    Refill:  5    Follow-up: Return in about 3 months (around 02/24/2021).    Carolann Littler, MD

## 2020-12-22 ENCOUNTER — Other Ambulatory Visit: Payer: Self-pay | Admitting: Family Medicine

## 2021-01-01 ENCOUNTER — Telehealth: Payer: Self-pay | Admitting: Family Medicine

## 2021-01-01 NOTE — Telephone Encounter (Signed)
Aaron Bradshaw is calling and stated that patient was prescribed Jardiance and Rybelus but can't afford medication. Pharmacist wanted to see if medications be switch out for something more affordable, please advise. CB is [phone]

## 2021-01-01 NOTE — Telephone Encounter (Signed)
Spoke with Juliann Pulse, she explained that the patient is in the coverage gap. At this time no medications of this class with be covered. Patient will not pay out of pocket for this and told her that he just wouldn't take the medication if it was this expensive, jardiance is $99 for 30 days and rybelsus is over $200 for 30 days.

## 2021-01-04 NOTE — Telephone Encounter (Signed)
ATC, voice mail is not set up.

## 2021-01-05 NOTE — Telephone Encounter (Signed)
Left message for patient to call back  

## 2021-01-06 NOTE — Telephone Encounter (Signed)
ATC, unable to leave a message.  

## 2021-01-07 NOTE — Telephone Encounter (Signed)
Unable to reach the patient. Message has been sent via mychart.

## 2021-01-16 ENCOUNTER — Other Ambulatory Visit: Payer: Self-pay | Admitting: Family Medicine

## 2021-01-30 ENCOUNTER — Other Ambulatory Visit: Payer: Self-pay | Admitting: Family Medicine

## 2021-03-03 ENCOUNTER — Encounter: Payer: Self-pay | Admitting: Family Medicine

## 2021-03-03 ENCOUNTER — Ambulatory Visit: Payer: Medicare Other | Admitting: Family Medicine

## 2021-03-03 ENCOUNTER — Other Ambulatory Visit: Payer: Self-pay

## 2021-03-03 VITALS — BP 140/70 | HR 70 | Temp 98.1°F | Wt 207.1 lb

## 2021-03-03 DIAGNOSIS — E119 Type 2 diabetes mellitus without complications: Secondary | ICD-10-CM | POA: Diagnosis not present

## 2021-03-03 LAB — POCT GLYCOSYLATED HEMOGLOBIN (HGB A1C): Hemoglobin A1C: 7.3 % — AB (ref 4.0–5.6)

## 2021-03-03 NOTE — Patient Instructions (Signed)
A1C improved at 7.3%.    Let's plan on 3 month follow up  Keep up the good work.

## 2021-03-03 NOTE — Progress Notes (Signed)
Established Patient Office Visit  Subjective:  Patient ID: Aaron Bradshaw, male    DOB: [birth year]  Age: 70 y.o. MRN: [id number]  CC:  Chief Complaint  Patient presents with   Follow-up    Diabetes.    HPI [name] presents for follow-up regarding type 2 diabetes.  His last A1c was 8.0%.  He already takes Jardiance and metformin.  We started Rybelsus 3 mg daily.  This was very expensive and he actually has only been taking a half a tablet but has also been exercising more.  Frequently exercising twice per day.  Home blood sugars have improved some.  Denies any side effects from the medication.  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    Past Surgical History:  Procedure Laterality Date   APPENDECTOMY     CHOLECYSTECTOMY  2010   COLONOSCOPY     Tics, Polyp 12/26/2003; polyps 2010, Dr Carlean Purl   KNEE ARTHROSCOPY     Left   LAPAROSCOPIC APPENDECTOMY N/A 10/05/2015   Procedure: APPENDECTOMY LAPAROSCOPIC;  Surgeon: Judeth Horn, MD;  Location: [facility];  Service: General;  Laterality: N/A;   NOSE SURGERY     Dr  Truman Hayward   POLYPECTOMY     ROTATOR CUFF REPAIR Bilateral    left   SHOULDER ARTHROSCOPY      Family History  Problem Relation Age of Onset   Depression Father    Heart disease Father        MI in 36s   Prostate cancer Father    OCD Father    Depression Mother    Arthritis Mother    Stroke Mother 46   Parkinsonism Mother    Diabetes Mother    Depression Sister        OCD   Prostate cancer Paternal  Grandfather    Diabetes Maternal Grandfather        MI in 46s   Colon cancer Neg Hx    Colon polyps Neg Hx    Esophageal cancer Neg Hx    Rectal cancer Neg Hx    Stomach cancer Neg Hx     Social History   Socioeconomic History   Marital status: Married    Spouse name: Not on file   Number of children: Not on file   Years of education: Not on file   Highest education level: Not on file  Occupational History   Not on file  Tobacco Use   Smoking status: Never   Smokeless tobacco: Never  Vaping Use   Vaping Use: Never used  Substance and Sexual Activity   Alcohol use: Yes    Alcohol/week: 1.0 - 2.0 standard drink    Types: 1 - 2 Standard drinks or equivalent per week    Comment: beer occ stopped wine   Drug use:  No   Sexual activity: Not on file  Other Topics Concern   Not on file  Social History Narrative   Not on file   Social Determinants of Health   Financial Resource Strain: Low Risk    Difficulty of Paying Living Expenses: Not hard at all  Food Insecurity: No Food Insecurity   Worried About Running Out of Food in the Last Year: Never true   [place] in the Last Year: Never true  Transportation Needs: No Transportation Needs   Lack of Transportation (Medical): No   Lack of Transportation (Non-Medical): No  Physical Activity: Sufficiently Active   Days of Exercise per Week: 7 days   Minutes of Exercise per Session: 60 min  Stress: No Stress Concern Present   Feeling of Stress : Not at all  Social Connections: Socially Integrated   Frequency of Communication with Friends and Family: More than three times a week   Frequency of Social Gatherings with Friends and Family: More than three times a week   Attends Religious Services: More than 4 times per year   Active Member of [employer] or Organizations: Yes   Attends Music therapist: More than 4 times per year   Marital Status: Married  Human resources officer Violence: Not At Risk   Fear of Current or  Ex-Partner: No   Emotionally Abused: No   Physically Abused: No   Sexually Abused: No    Outpatient Medications Prior to Visit  Medication Sig Dispense Refill   Ascorbic Acid (VITAMIN C) 250 MG tablet Take 250 mg by mouth daily.     b complex vitamins capsule Take 1 capsule by mouth daily.     Blood Glucose Monitoring Suppl (ONETOUCH VERIO) w/Device KIT Use 1-4 times daily as needed/directed DX E11.9 1 kit 0   CHOLECALCIFEROL PO Take 1 tablet by mouth daily.     co-enzyme Q-10 30 MG capsule Take 30 mg by mouth daily.     empagliflozin (JARDIANCE) 25 MG TABS tablet Take 1 tablet (25 mg total) by mouth daily before breakfast. 30 tablet 11   EPIPEN 2-PAK 0.3 MG/0.3ML SOAJ injection USE AS DIRECTED AS NEEDED  1   glucose blood (ONE TOUCH ULTRA TEST) test strip Use once daily.  Dx E11.9 100 each 3   imiquimod (ALDARA) 5 % cream APPLY 1 APPLICATION APPLY ON THE SKIN AS DIRECTED APPLY EVERY OTHER NIGHT FOR 4 WEEKS.     KRILL OIL PO Take 1 capsule by mouth daily.     levothyroxine (SYNTHROID) 75 MCG tablet TAKE 1 TABLET BY MOUTH EVERY DAY 90 tablet 1   MAGNESIUM PO Take 1 tablet by mouth daily.     metFORMIN (GLUCOPHAGE-XR) 750 MG 24 hr tablet TAKE 1 TABLET BY MOUTH TWICE A DAY 180 tablet 0   neomycin-polymyxin b-dexamethasone (MAXITROL) 3.5-10000-0.1 SUSP INSTIL 1 DROP 4 TIMES DAILY IN RIGHT EYE FOR 5 DAYS THEN 2 TIMES DAILY FOR 5 DAYS     Omega-3 Fatty Acids (FISH OIL) 1000 MG CAPS Take by mouth.     PRESCRIPTION MEDICATION Inject 1 each into the skin every 7 (seven) days. Patient gets an allergy shot once a week at [employer] [address] off of [address]     Probiotic Product (PROBIOTIC DAILY PO) Take by mouth daily.     rosuvastatin (CRESTOR) 10 MG tablet TAKE 1 TABLET BY MOUTH EVERY DAY 90 tablet 1   Semaglutide (RYBELSUS) 3 MG TABS Take 3 mg by mouth daily. 30 tablet 5  Turmeric 500 MG CAPS Take 1,000 mg by mouth daily.     Facility-Administered Medications Prior to Visit  Medication Dose Route  Frequency Provider Last Rate Last Admin   0.9 %  sodium chloride infusion  500 mL Intravenous Once Gatha Mayer, MD        Allergies  Allergen Reactions   Codeine Nausea Only   Prozac [Fluoxetine Hcl] Anxiety    Increased anxiety    ROS Review of Systems  Constitutional:  Negative for appetite change and unexpected weight change.  Endocrine: Negative for polydipsia and polyuria.     Objective:    Physical Exam Vitals reviewed.  Constitutional:      Appearance: Normal appearance.  Cardiovascular:     Rate and Rhythm: Normal rate and regular rhythm.  Pulmonary:     Effort: Pulmonary effort is normal.     Breath sounds: Normal breath sounds.  Neurological:     Mental Status: He is alert.    BP 140/70 (BP Location: Left Arm, Patient Position: Sitting, Cuff Size: Normal)   Pulse 70   Temp 98.1 F (36.7 C) (Oral)   Wt 207 lb 1.6 oz (93.9 kg)   SpO2 98%   BMI 25.89 kg/m  Wt Readings from Last 3 Encounters:  03/03/21 207 lb 1.6 oz (93.9 kg)  11/24/20 207 lb 12.8 oz (94.3 kg)  03/10/20 212 lb (96.2 kg)     Health Maintenance Due  Topic Date Due   Zoster Vaccines- Shingrix (1 of 2) Never done   COVID-19 Vaccine (4 - Booster for Pf[employer] series) 02/11/2020   INFLUENZA VACCINE  02/22/2021   URINE MICROALBUMIN  03/10/2021    There are no preventive care reminders to display for this patient.  Lab Results  Component Value Date   TSH 3.28 11/24/2020   Lab Results  Component Value Date   WBC 7.5 03/27/2018   HGB 15.8 03/27/2018   HCT 44.7 03/27/2018   MCV 87.2 03/27/2018   PLT 193.0 03/27/2018   Lab Results  Component Value Date   NA 138 11/24/2020   K 4.3 11/24/2020   CO2 28 11/24/2020   GLUCOSE 203 (H) 11/24/2020   BUN 27 (H) 11/24/2020   CREATININE 0.94 11/24/2020   BILITOT 1.4 (H) 11/24/2020   ALKPHOS 79 11/24/2020   AST 18 11/24/2020   ALT 18 11/24/2020   PROT 7.0 11/24/2020   ALBUMIN 4.4 11/24/2020   CALCIUM 9.6 11/24/2020   ANIONGAP 9  09/25/2015   GFR 82.96 11/24/2020   Lab Results  Component Value Date   CHOL 159 11/24/2020   Lab Results  Component Value Date   HDL 38.60 (L) 11/24/2020   Lab Results  Component Value Date   LDLCALC 84 11/24/2020   Lab Results  Component Value Date   TRIG 181.0 (H) 11/24/2020   Lab Results  Component Value Date   CHOLHDL 4 11/24/2020   Lab Results  Component Value Date   HGBA1C 7.3 (A) 03/03/2021      Assessment & Plan:   Type 2 diabetes improving with A1c 7.3%.  Suspect drop from 8.0 largely due to lifestyle issues as he is only taking a half of a 3 mg Rybelsus.  He had significant cost issues with the medication.  -Continue current course.  Would like to titrate the Rybelsus but cost is prohibitive -Continue regular exercise habits -He has good knowledge regarding foods -57-monthfollow-up.  Hopefully will see additional lowering by that time     Follow-up:  Return in about 3 months (around 06/03/2021).    Carolann Littler, MD

## 2021-03-22 ENCOUNTER — Telehealth: Payer: Self-pay | Admitting: Family Medicine

## 2021-03-22 NOTE — Telephone Encounter (Signed)
Left message for patient to call back and schedule Medicare Annual Wellness Visit (AWV) either virtually or in office. Left  my Aaron Bradshaw number [phone]   Last AWV 04/03/20  please schedule at anytime with [facility] [place] 1 or 2   This should be a 45 minute visit.

## 2021-03-26 ENCOUNTER — Other Ambulatory Visit: Payer: Self-pay | Admitting: Family Medicine

## 2021-04-14 LAB — HM DIABETES EYE EXAM

## 2021-04-19 ENCOUNTER — Telehealth: Payer: Self-pay | Admitting: Family Medicine

## 2021-04-19 NOTE — Telephone Encounter (Signed)
Left message for patient to call back and schedule Medicare Annual Wellness Visit (AWV) either virtually or in office. Left  my Aaron Bradshaw number [phone]   Last AWV 04/03/20 please schedule at anytime with [facility] [place] 1 or 2   This should be a 45 minute visit.

## 2021-04-30 ENCOUNTER — Other Ambulatory Visit: Payer: Self-pay | Admitting: Family Medicine

## 2021-05-06 ENCOUNTER — Other Ambulatory Visit: Payer: Self-pay | Admitting: Family Medicine

## 2021-05-13 ENCOUNTER — Telehealth: Payer: Self-pay

## 2021-05-13 NOTE — Telephone Encounter (Signed)
Medication last refilled by pt on 01/30/21. Pt should be due a refill.   LVM instructions for pt to call office to confirm that he is still taking Rosuvastatin & if he needs a refill at this time.

## 2021-05-18 NOTE — Telephone Encounter (Signed)
Pt states that Rosuvastatin was last refilled last week. Denies questions/concerns/needs at this time.

## 2021-06-04 ENCOUNTER — Ambulatory Visit: Payer: Medicare Other | Admitting: Family Medicine

## 2021-06-04 VITALS — BP 140/70 | HR 62 | Temp 97.9°F | Wt 206.7 lb

## 2021-06-04 DIAGNOSIS — E119 Type 2 diabetes mellitus without complications: Secondary | ICD-10-CM

## 2021-06-04 LAB — POCT GLYCOSYLATED HEMOGLOBIN (HGB A1C): Hemoglobin A1C: 7.8 % — AB (ref 4.0–5.6)

## 2021-06-04 MED ORDER — RYBELSUS 3 MG PO TABS: 3.0000 mg | ORAL_TABLET | Freq: Every day | ORAL | 5 refills | Status: DC

## 2021-06-04 NOTE — Progress Notes (Signed)
Established Patient Office Visit  Subjective:  Patient ID: Aaron Bradshaw, male    DOB: May 05, 1951  Age: 70 y.o. MRN: [id number]  CC:  Chief Complaint  Patient presents with   Follow-up    Diabetes     HPI [name] presents for follow-up type 2 diabetes.  Last A1c 7.3%.  We had added low-dose Rybelsus but unfortunately insurance was prohibitive.  At this point he remains on metformin and Jardiance.  He is diligent with exercising most days of the week.  Has taken a part-time job at [employer]  Generally feels well.  No polyuria or polydipsia.  Weight stable.  Good appetite.  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;  Service: General;  Laterality: N/A;   NOSE SURGERY     Dr  Truman Hayward   POLYPECTOMY     ROTATOR CUFF REPAIR Bilateral    left   SHOULDER ARTHROSCOPY      Family History  Problem Relation Age of Onset   Depression Father    Heart disease Father        MI in 26s   Prostate cancer Father    OCD Father    Depression Mother    Arthritis Mother    Stroke Mother 53   Parkinsonism Mother    Diabetes Mother    Depression Sister        OCD   Prostate cancer Paternal Grandfather     Diabetes Maternal Grandfather        MI in 51s   Colon cancer Neg Hx    Colon polyps Neg Hx    Esophageal cancer Neg Hx    Rectal cancer Neg Hx    Stomach cancer Neg Hx     Social History   Socioeconomic History   Marital status: Married    Spouse name: Not on file   Number of children: Not on file   Years of education: Not on file   Highest education level: Not on file  Occupational History   Not on file  Tobacco Use   Smoking status: Never   Smokeless tobacco: Never  Vaping Use   Vaping Use: Never used  Substance and Sexual Activity   Alcohol use: Yes    Alcohol/week: 1.0 - 2.0 standard drink    Types: 1 - 2 Standard drinks or equivalent per week    Comment: beer occ stopped wine   Drug use: No  Sexual activity: Not on file  Other Topics Concern   Not on file  Social History Narrative   Not on file   Social Determinants of Health   Financial Resource Strain: Not on file  Food Insecurity: Not on file  Transportation Needs: Not on file  Physical Activity: Not on file  Stress: Not on file  Social Connections: Not on file  Intimate Partner Violence: Not on file    Outpatient Medications Prior to Visit  Medication Sig Dispense Refill   Ascorbic Acid (VITAMIN C) 250 MG tablet Take 250 mg by mouth daily.     b complex vitamins capsule Take 1 capsule by mouth daily.     Blood Glucose Monitoring Suppl (ONETOUCH VERIO) w/Device KIT Use 1-4 times daily as needed/directed DX E11.9 1 kit 0   CHOLECALCIFEROL PO Take 1 tablet by mouth daily.     co-enzyme Q-10 30 MG capsule Take 30 mg by mouth daily.     EPIPEN 2-PAK 0.3 MG/0.3ML SOAJ injection USE AS DIRECTED AS NEEDED  1   glucose blood (ONE TOUCH ULTRA TEST) test strip Use once daily.  Dx E11.9 100 each 3   imiquimod (ALDARA) 5 % cream APPLY 1 APPLICATION APPLY ON THE SKIN AS DIRECTED APPLY EVERY OTHER NIGHT FOR 4 WEEKS.     JARDIANCE 25 MG TABS tablet TAKE 1 TABLET (25 MG TOTAL) BY MOUTH DAILY BEFORE BREAKFAST. 30  tablet 11   KRILL OIL PO Take 1 capsule by mouth daily.     levothyroxine (SYNTHROID) 75 MCG tablet TAKE 1 TABLET BY MOUTH EVERY DAY 90 tablet 1   MAGNESIUM PO Take 1 tablet by mouth daily.     metFORMIN (GLUCOPHAGE-XR) 750 MG 24 hr tablet TAKE 1 TABLET BY MOUTH TWICE A DAY 180 tablet 0   neomycin-polymyxin b-dexamethasone (MAXITROL) 3.5-10000-0.1 SUSP INSTIL 1 DROP 4 TIMES DAILY IN RIGHT EYE FOR 5 DAYS THEN 2 TIMES DAILY FOR 5 DAYS     Omega-3 Fatty Acids (FISH OIL) 1000 MG CAPS Take by mouth.     PRESCRIPTION MEDICATION Inject 1 each into the skin every 7 (seven) days. Patient gets an allergy shot once a week at [employer] off of [address]     Probiotic Product (PROBIOTIC DAILY PO) Take by mouth daily.     rosuvastatin (CRESTOR) 10 MG tablet TAKE 1 TABLET BY MOUTH EVERY DAY 90 tablet 1   Turmeric 500 MG CAPS Take 1,000 mg by mouth daily.     Semaglutide (RYBELSUS) 3 MG TABS Take 3 mg by mouth daily. 30 tablet 5   Facility-Administered Medications Prior to Visit  Medication Dose Route Frequency Provider Last Rate Last Admin   0.9 %  sodium chloride infusion  500 mL Intravenous Once Gatha Mayer, MD        Allergies  Allergen Reactions   Codeine Nausea Only   Prozac [Fluoxetine Hcl] Anxiety    Increased anxiety    ROS Review of Systems  Constitutional:  Negative for fatigue.  Eyes:  Negative for visual disturbance.  Respiratory:  Negative for cough, chest tightness and shortness of breath.   Cardiovascular:  Negative for chest pain, palpitations and leg swelling.  Neurological:  Negative for dizziness, syncope, weakness, light-headedness and headaches.     Objective:    Physical Exam Vitals reviewed.  Constitutional:      Appearance: Normal appearance.  Cardiovascular:     Rate and Rhythm: Normal rate and regular rhythm.  Pulmonary:     Effort: Pulmonary  effort is normal.     Breath sounds: Normal breath sounds.  Skin:    Comments: Feet reveal no skin lesions. Good  distal foot pulses. Good capillary refill. No calluses. Normal sensation with monofilament testing   Neurological:     Mental Status: He is alert.    BP 140/70 (BP Location: Left Arm, Patient Position: Sitting, Cuff Size: Normal)   Pulse 62   Temp 97.9 F (36.6 C) (Oral)   Wt 206 lb 11.2 oz (93.8 kg)   SpO2 98%   BMI 25.84 kg/m  Wt Readings from Last 3 Encounters:  06/04/21 206 lb 11.2 oz (93.8 kg)  03/03/21 207 lb 1.6 oz (93.9 kg)  11/24/20 207 lb 12.8 oz (94.3 kg)     Health Maintenance Due  Topic Date Due   Zoster Vaccines- Shingrix (1 of 2) Never done   Pneumonia Vaccine 49+ Years old (62 - PPSV23 if available, else PCV20) 01/11/2019   COVID-19 Vaccine (4 - Booster for Pfizer series) 01/07/2020   URINE MICROALBUMIN  03/10/2021    There are no preventive care reminders to display for this patient.  Lab Results  Component Value Date   TSH 3.28 11/24/2020   Lab Results  Component Value Date   WBC 7.5 03/27/2018   HGB 15.8 03/27/2018   HCT 44.7 03/27/2018   MCV 87.2 03/27/2018   PLT 193.0 03/27/2018   Lab Results  Component Value Date   NA 138 11/24/2020   K 4.3 11/24/2020   CO2 28 11/24/2020   GLUCOSE 203 (H) 11/24/2020   BUN 27 (H) 11/24/2020   CREATININE 0.94 11/24/2020   BILITOT 1.4 (H) 11/24/2020   ALKPHOS 79 11/24/2020   AST 18 11/24/2020   ALT 18 11/24/2020   PROT 7.0 11/24/2020   ALBUMIN 4.4 11/24/2020   CALCIUM 9.6 11/24/2020   ANIONGAP 9 09/25/2015   GFR 82.96 11/24/2020   Lab Results  Component Value Date   CHOL 159 11/24/2020   Lab Results  Component Value Date   HDL 38.60 (L) 11/24/2020   Lab Results  Component Value Date   LDLCALC 84 11/24/2020   Lab Results  Component Value Date   TRIG 181.0 (H) 11/24/2020   Lab Results  Component Value Date   CHOLHDL 4 11/24/2020   Lab Results  Component Value Date   HGBA1C 7.8 (A) 06/04/2021      Assessment & Plan:   Problem List Items Addressed This Visit       Unprioritized    Type 2 diabetes mellitus, controlled ([place]) - Primary   Relevant Medications   Semaglutide (RYBELSUS) 3 MG TABS   Other Relevant Orders   POCT glycosylated hemoglobin (Hb A1C) (Completed)  A1c today 7.8%.  Blood sugars much improved when he was on Rybelsus.  He would like to see about getting a printed prescription to see if this might be covered at this time.  Prefer to avoid sulfonylureas.  Continue regular exercise habits  Follow-up in 3 months to reassess  Meds ordered this encounter  Medications   Semaglutide (RYBELSUS) 3 MG TABS    Sig: Take 3 mg by mouth daily.    Dispense:  30 tablet    Refill:  5    Follow-up: Return in about 3 months (around 09/04/2021).    Carolann Littler, MD

## 2021-06-21 ENCOUNTER — Telehealth: Payer: Self-pay | Admitting: Family Medicine

## 2021-06-21 NOTE — Telephone Encounter (Signed)
Left message for patient to call back and schedule Medicare Annual Wellness Visit (AWV) either virtually or in office. Left  my Herbie Drape number [phone]   Last AWV 04/03/20  please schedule at anytime with [facility] or 2   This should be a 45 minute visit.

## 2021-07-12 ENCOUNTER — Ambulatory Visit (INDEPENDENT_AMBULATORY_CARE_PROVIDER_SITE_OTHER): Payer: Medicare Other

## 2021-07-12 VITALS — BP 122/72 | Temp 97.0°F | Wt 204.6 lb

## 2021-07-12 DIAGNOSIS — Z0000 Encounter for general adult medical examination without abnormal findings: Secondary | ICD-10-CM | POA: Diagnosis not present

## 2021-07-12 NOTE — Progress Notes (Addendum)
Subjective:   Aaron Bradshaw is a 70 y.o. male who presents for Medicare Annual/Subsequent preventive examination.  Review of Systems    No Ros Cardiac Risk Factors include: advanced age (>46mn, >>41women);diabetes mellitus   Objective:    Today's Vitals   07/12/21 0824  BP: 122/72  Temp: (!) 97 F (36.1 C)  SpO2: 97%  Weight: 204 lb 9.6 oz (92.8 kg)   Body mass index is 25.57 kg/m.  Advanced Directives 07/12/2021 04/03/2020 11/28/2017 09/25/2015 08/05/2015 07/30/2015 01/08/2015  Does Patient Have a Medical Advance Directive? Yes Yes _0   Type of [place]amed[place] will - - - - -  Does patient want to make changes to medical advance directive? No - Patient declined - - - - - -  Copy of [place]in Chart? No - copy requested No - copy requested - - - - -  Would patient like information on creating a medical advance directive? - - - Yes - Educational materials given - No - patient declined information No - patient declined information    Current Medications (verified) Outpatient Encounter Medications as of 07/12/2021  Medication Sig   Ascorbic Acid (VITAMIN C) 250 MG tablet Take 250 mg by mouth daily.   b complex vitamins capsule Take 1 capsule by mouth daily.   Blood Glucose Monitoring Suppl (ONETOUCH VERIO) w/Device KIT Use 1-4 times daily as needed/directed DX E11.9   CHOLECALCIFEROL PO Take 1 tablet by mouth daily.   co-enzyme Q-10 30 MG capsule Take 30 mg by mouth daily.   EPIPEN 2-PAK 0.3 MG/0.3ML SOAJ injection USE AS DIRECTED AS NEEDED   glucose blood (ONE TOUCH ULTRA TEST) test strip Use once daily.  Dx E11.9   imiquimod (ALDARA) 5 % cream APPLY 1 APPLICATION APPLY ON THE SKIN AS DIRECTED APPLY EVERY OTHER NIGHT FOR 4 WEEKS.   JARDIANCE 25 MG TABS tablet TAKE 1 TABLET (25 MG TOTAL) BY MOUTH DAILY BEFORE BREAKFAST.   KRILL OIL PO Take 1 capsule by mouth daily.    levothyroxine (SYNTHROID) 75 MCG tablet TAKE 1 TABLET BY MOUTH EVERY DAY   MAGNESIUM PO Take 1 tablet by mouth daily.   metFORMIN (GLUCOPHAGE-XR) 750 MG 24 hr tablet TAKE 1 TABLET BY MOUTH TWICE A DAY   neomycin-polymyxin b-dexamethasone (MAXITROL) 3.5-10000-0.1 SUSP INSTIL 1 DROP 4 TIMES DAILY IN RIGHT EYE FOR 5 DAYS THEN 2 TIMES DAILY FOR 5 DAYS   Omega-3 Fatty Acids (FISH OIL) 1000 MG CAPS Take by mouth.   PRESCRIPTION MEDICATION Inject 1 each into the skin every 7 (seven) days. Patient [facility] an [facility] shot [facility] [employer] [facility] [address]   Probiotic Product (PROBIOTIC DAILY PO) Take by mouth daily.   rosuvastatin (CRESTOR) 10 MG tablet TAKE 1 TABLET BY MOUTH EVERY DAY   Semaglutide (RYBELSUS) 3 MG TABS Take 3 mg by mouth daily.   Turmeric 500 MG CAPS Take 1,000 mg by mouth daily.   Facility-Administered Encounter Medications as of 07/12/2021  Medication   0.9 %  sodium chloride infusion    Allergies (verified) Codeine and Prozac [fluoxetine hcl]   History: Past Medical History:  Diagnosis Date   Allergy    allergy shots every 3 weeks    Anxiety    past hx    Cancer ([place]    basal cell & squamos cell   Diabetes mellitus ([place] 6/12   A1c 7.1 %  dx 4 yrs ago   [name] syndrome    Heart murmur    'when I was younger"   History of chest pain    Hospitalized ER in [place] for CP and Palpitations with Neg Enzymes   Hyperlipidemia    Shingles    2015   Testosterone deficiency    Dr Hartley Barefoot   Thyroid disease    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   Past Surgical History:  Procedure Laterality Date   APPENDECTOMY     CHOLECYSTECTOMY  2010   COLONOSCOPY     Tics, Polyp 12/26/2003; polyps 2010, Dr Carlean Purl   KNEE ARTHROSCOPY     Left   LAPAROSCOPIC APPENDECTOMY N/A 10/05/2015   Procedure: APPENDECTOMY LAPAROSCOPIC;  Surgeon: Judeth Horn, MD;  Location: [facility];  Service: General;  Laterality: N/A;   NOSE SURGERY     Dr  Truman Hayward   POLYPECTOMY     ROTATOR CUFF REPAIR  Bilateral    left   SHOULDER ARTHROSCOPY     Family History  Problem Relation Age of Onset   Depression Father    Heart disease Father        MI in 66s   Prostate cancer Father    OCD Father    Depression Mother    Arthritis Mother    Stroke Mother 46   Parkinsonism Mother    Diabetes Mother    Depression Sister        OCD   Prostate cancer Paternal Grandfather    Diabetes Maternal Grandfather        MI in 23s   Colon cancer Neg Hx    Colon polyps Neg Hx    Esophageal cancer Neg Hx    Rectal cancer Neg Hx    Stomach cancer Neg Hx    Social History   Socioeconomic History   Marital status: Married    Spouse name: Not on file   Number of children: Not on file   Years of education: Not on file   Highest education level: Not on file  Occupational History   Not on file  Tobacco Use   Smoking status: Never   Smokeless tobacco: Never  Vaping Use   Vaping Use: Never used  Substance and Sexual Activity   Alcohol use: Yes    Alcohol/week: 1.0 - 2.0 standard drink    Types: 1 - 2 Standard drinks or equivalent per week    Comment: beer occ stopped wine   Drug use: No   Sexual activity: Not on file  Other Topics Concern   Not on file  Social History Narrative   Not on file   Social Determinants of Health   Financial Resource Strain: Low Risk    Difficulty of Paying Living Expenses: Not hard at all  Food Insecurity: No Food Insecurity   Worried About Charity fundraiser in the Last Year: Never true   Ran Out of Food in the Last Year: Never true  Transportation Needs: No Transportation Needs   Lack of Transportation (Medical): No   Lack of Transportation (Non-Medical): No  Physical Activity: Sufficiently Active   Days of Exercise per Week: 7 days   Minutes of Exercise per Session: 70 min  Stress: No Stress Concern Present   Feeling of Stress : Not at all  Social Connections: Socially Integrated   Frequency of Communication with Friends and Family: More than  three times a week   Frequency of Social Gatherings with Friends and Family: More than  three times a week  ° Attends Religious Services: More than 4 times per year  ° Active Member of Clubs or Organizations: Yes  ° Attends Club or Organization Meetings: More than 4 times per year  ° Marital Status: Married  ° ° °Clinical Intake: ° °Pre-visit preparation completed: YesNutrition Risk Assessment: ° °Has the patient had any N/V/D within the last 2 months?  No  °Does the patient have any non-healing wounds?  No  °Has the patient had any unintentional weight loss or weight gain?  No  ° °Diabetes: ° °Is the patient diabetic?  Yes  °If diabetic, was a CBG obtained today?  No  °Did the patient bring in their glucometer from home?  No  °How often do you monitor your CBG's? Every other day..  ° °Financial Strains and Diabetes Management: ° °Are you having any financial strains with the device, your supplies or your medication? No .  °Does the patient want to be seen by Chronic Care Management for management of their diabetes?  No  °Would the patient like to be referred to a Nutritionist or for Diabetic Management?  No Followed by PCP ° °Diabetic Exams: ° °Diabetic Eye Exam: Completed Yes.  ° °  °Activities of Daily Living °In your present state of health, do you have any difficulty performing the following activities: 07/12/2021  °Hearing? N  °Vision? N  °Difficulty concentrating or making decisions? N  °Walking or climbing stairs? N  °Dressing or bathing? N  °Preparing Food and eating ? N  °Using the Toilet? N  °In the past six months, have you accidently leaked urine? N  °Do you have problems with loss of bowel control? N  °Managing your Medications? N  °Managing your Finances? N  °Housekeeping or managing your Housekeeping? N  °Some recent data might be hidden  ° ° °Patient Care Team: °Burchette, Bruce W, MD as PCP - General (Family Medicine) ° °Indicate any recent Medical Services you may have received from other than  [facility] providers in the past year (date may be approximate). ° °   °Assessment:  ° This is a routine wellness examination for Jeremi. ° °Hearing/Vision screen °Hearing Screening - Comments:: No difficulty hearing °Vision Screening - Comments:: Wears glasses. Followed by Dr Gould ° °Dietary issues and exercise activities discussed: °Current Exercise Habits: Structured exercise class, Type of exercise: strength training/weights;stretching;walking, Time (Minutes): > 60, Frequency (Times/Week): 7, Weekly Exercise (Minutes/Week): 0, Intensity: Moderate ° ° Goals Addressed   ° °  °  °  °  ° This Visit's Progress  °  Patient Stated     °  Maintain current weight 204.6 °Continue to monitor your BS °Continue your great exercise plan. ° °  ° °  ° °Depression Screen °PHQ 2/9 Scores 07/12/2021 06/04/2021 04/03/2020 12/09/2019 12/04/2018 11/28/2017 10/10/2017  °PHQ - 2 Score 0 0 0 0 0 0 0  °PHQ- 9 Score - - 0 - - - -  °  °Fall Risk °Fall Risk  07/12/2021 06/04/2021 04/03/2020 12/09/2019 12/04/2018  °Falls in the past year? 0 0 0 1 0  °Number falls in past yr: 0 - 0 0 -  °Injury with Fall? 0 - 0 0 -  °Risk for fall due to : - - Medication side effect History of fall(s) -  °Follow up - - Falls evaluation completed;Falls prevention discussed Falls evaluation completed -  °Comment - - - - -  ° ° °FALL RISK PREVENTION PERTAINING TO THE HOME: ° °Any stairs   in or around the home?  °If so, are there any without handrails? No  °Home free of loose throw rugs in walkways, pet beds, electrical cords, etc? Yes  °Adequate lighting in your home to reduce risk of falls? Yes  ° °ASSISTIVE DEVICES UTILIZED TO PREVENT FALLS: ° °Life alert? No  °Use of a cane, walker or w/c? No  °Grab bars in the bathroom? Yes  °Shower chair or bench in shower? Yes  °Elevated toilet seat or a handicapped toilet? No  ° °TIMED UP AND GO: ° °Was the test performed? Yes .  °Length of time to ambulate 10 feet: 5 sec.  ° °Gait steady and fast without use of assistive  device ° °Cognitive Function: °MMSE - Mini Mental State Exam 11/28/2017  °Not completed: (No Data)  ° °  °6CIT Screen 07/12/2021  °What Year? 0 points  °What month? 0 points  °What time? 0 points  °Count back from 20 0 points  °Months in reverse 0 points  °Repeat phrase 0 points  °Total Score 0  ° ° °Immunizations °Immunization History  °Administered Date(s) Administered  ° Fluad Quad(high Dose 65+) 05/14/2019, 06/04/2020  ° Influenza Whole 05/25/2012  ° Influenza, High Dose Seasonal PF 04/12/2017  ° Influenza,inj,Quad PF,6+ Mos 04/17/2013, 07/02/2015, 04/18/2016, 04/13/2018  ° PFIZER(Purple Top)SARS-COV-2 Vaccination 09/01/2019, 09/25/2019  ° Pneumococcal Conjugate-13 01/03/2017, 01/10/2018  ° Pneumococcal Polysaccharide-23 05/16/2013  ° Tdap 01/03/2017  ° Zoster, Live 05/14/2014  ° ° °Pneumococcal vaccine status: Due, Education has been provided regarding the importance of this vaccine. Advised may receive this vaccine at local pharmacy or Health Dept. Aware to provide a copy of the vaccination record if obtained from local pharmacy or Health Dept. Verbalized acceptance and understanding. ° °Covid-19 vaccine status: Information provided on how to obtain vaccines.  ° °Qualifies for Shingles Vaccine? Yes   °Zostavax completed No   °Shingrix Completed?: No.    Education has been provided regarding the importance of this vaccine. Patient has been advised to call insurance company to determine out of pocket expense if they have not yet received this vaccine. Advised may also receive vaccine at local pharmacy or Health Dept. Verbalized acceptance and understanding. ° °Screening Tests °Health Maintenance  °Topic Date Due  ° COVID-19 Vaccine (4 - Booster for Pfizer series) 07/28/2021 (Originally 01/07/2020)  ° URINE MICROALBUMIN  08/12/2021 (Originally 03/10/2021)  ° Zoster Vaccines- Shingrix (1 of 2) 10/10/2021 (Originally 10/27/1969)  ° Pneumonia Vaccine 65+ Years old (3 - PPSV23 if available, else PCV20) 07/12/2022  (Originally 01/11/2019)  ° FOOT EXAM  11/24/2021  ° HEMOGLOBIN A1C  12/02/2021  ° OPHTHALMOLOGY EXAM  04/14/2022  ° COLONOSCOPY (Pts 45-49yrs Insurance coverage will need to be confirmed)  01/22/2024  ° TETANUS/TDAP  01/04/2027  ° INFLUENZA VACCINE  Completed  ° Hepatitis C Screening  Completed  ° HPV VACCINES  Aged Out  ° ° °Health Maintenance ° °There are no preventive care reminders to display for this patient. ° ° ° °Additional Screening: ° ° °Vision Screening: Recommended annual ophthalmology exams for early detection of glaucoma and other disorders of the eye. °Is the patient up to date with their annual eye exam?  Yes  °Who is the provider or what is the name of the office in which the patient attends annual eye exams? Followed by Dr Gould ° °Dental Screening: Recommended annual dental exams for proper oral hygiene ° °Community Resource Referral / Chronic Care Management: ° °CRR required this visit?  No  ° °CCM required this   visit?  No  ° ° °  °Plan:  °  ° °I have personally reviewed and noted the following in the patient’s chart:  ° °Medical and social history °Use of alcohol, tobacco or illicit drugs  °Current medications and supplements including opioid prescriptions. Patient is not currently taking opioid prescriptions. °Functional ability and status °Nutritional status °Physical activity °Advanced directives °List of other physicians °Hospitalizations, surgeries, and ER visits in previous 12 months °Vitals °Screenings to include cognitive, depression, and falls °Referrals and appointments ° °In addition, I have reviewed and discussed with patient certain preventive protocols, quality metrics, and best practice recommendations. A written personalized care plan for preventive services as well as general preventive health recommendations were provided to patient. °  ° ° °Beverly W Wilson, LPN   07/12/2021  ° ° ° ° °

## 2021-07-12 NOTE — Patient Instructions (Addendum)
[name] , Thank you for taking time to come for your Medicare Wellness Visit. I appreciate your ongoing commitment to your health goals. Please review the following plan we discussed and let me know if I can assist you in the future.   These are the goals we discussed:  Goals      Patient Stated     Maintain current weight 204.6 Continue to monitor your BS Continue your great exercise plan.         This is a list of the screening recommended for you and due dates:  Health Maintenance  Topic Date Due   COVID-19 Vaccine (4 - Booster for Pfizer series) 07/28/2021*   Urine Protein Check  08/12/2021*   Zoster (Shingles) Vaccine (1 of 2) 10/10/2021*   Pneumonia Vaccine (3 - PPSV23 if available, else PCV20) 07/12/2022*   Complete foot exam   11/24/2021   Hemoglobin A1C  12/02/2021   Eye exam for diabetics  04/14/2022   Colon Cancer Screening  01/22/2024   Tetanus Vaccine  01/04/2027   Flu Shot  Completed   Hepatitis C Screening: USPSTF Recommendation to screen - Ages 18-79 yo.  Completed   HPV Vaccine  Aged Out  *Topic was postponed. The date shown is not the original due date.      Advanced directives: Yes  Conditions/risks identified: None  Next appointment: Follow up in one year for your annual wellness visit.   Preventive Care 2 Years and Older, Male Preventive care refers to lifestyle choices and visits with your health care provider that can promote health and wellness. What does preventive care include? A yearly physical exam. This is also called an annual well check. Dental exams once or twice a year. Routine eye exams. Ask your health care provider how often you should have your eyes checked. Personal lifestyle choices, including: Daily care of your teeth and gums. Regular physical activity. Eating a healthy diet. Avoiding tobacco and drug use. Limiting alcohol use. Practicing safe sex. Taking low doses of aspirin every day. Taking vitamin and mineral  supplements as recommended by your health care provider. What happens during an annual well check? The services and screenings done by your health care provider during your annual well check will depend on your age, overall health, lifestyle risk factors, and family history of disease. Counseling  Your health care provider may ask you questions about your: Alcohol use. Tobacco use. Drug use. Emotional well-being. Home and relationship well-being. Sexual activity. Eating habits. History of falls. Memory and ability to understand (cognition). Work and work Statistician. Screening  You may have the following tests or measurements: Height, weight, and BMI. Blood pressure. Lipid and cholesterol levels. These may be checked every 5 years, or more frequently if you are over 30 years old. Skin check. Lung cancer screening. You may have this screening every year starting at age 11 if you have a 30-pack-year history of smoking and currently smoke or have quit within the past 15 years. Fecal occult blood test (FOBT) of the stool. You may have this test every year starting at age 37. Flexible sigmoidoscopy or colonoscopy. You may have a sigmoidoscopy every 5 years or a colonoscopy every 10 years starting at age 65. Prostate cancer screening. Recommendations will vary depending on your family history and other risks. Hepatitis C blood test. Hepatitis B blood test. Sexually transmitted disease (STD) testing. Diabetes screening. This is done by checking your blood sugar (glucose) after you have not eaten for a while (  fasting). You may have this done every 1-3 years. Abdominal aortic aneurysm (AAA) screening. You may need this if you are a current or former smoker. Osteoporosis. You may be screened starting at age 67 if you are at high risk. Talk with your health care provider about your test results, treatment options, and if necessary, the need for more tests. Vaccines  Your health care provider  may recommend certain vaccines, such as: Influenza vaccine. This is recommended every year. Tetanus, diphtheria, and acellular pertussis (Tdap, Td) vaccine. You may need a Td booster every 10 years. Zoster vaccine. You may need this after age 21. Pneumococcal 13-valent conjugate (PCV13) vaccine. One dose is recommended after age 41. Pneumococcal polysaccharide (PPSV23) vaccine. One dose is recommended after age 52. Talk to your health care provider about which screenings and vaccines you need and how often you need them. This information is not intended to replace advice given to you by your health care provider. Make sure you discuss any questions you have with your health care provider. Document Released: 08/07/2015 Document Revised: 03/30/2016 Document Reviewed: 05/12/2015 Elsevier Interactive Patient Education  2017 [place] Prevention in the Home Falls can cause injuries. They can happen to people of all ages. There are many things you can do to make your home safe and to help prevent falls. What can I do on the outside of my home? Regularly fix the edges of walkways and driveways and fix any cracks. Remove anything that might make you trip as you walk through a door, such as a raised step or threshold. Trim any bushes or trees on the path to your home. Use bright outdoor lighting. Clear any walking paths of anything that might make someone trip, such as rocks or tools. Regularly check to see if handrails are loose or broken. Make sure that both sides of any steps have handrails. Any raised decks and porches should have guardrails on the edges. Have any leaves, snow, or ice cleared regularly. Use sand or salt on walking paths during winter. Clean up any spills in your garage right away. This includes oil or grease spills. What can I do in the bathroom? Use night lights. Install grab bars by the toilet and in the tub and shower. Do not use towel bars as grab bars. Use  non-skid mats or decals in the tub or shower. If you need to sit down in the shower, use a plastic, non-slip stool. Keep the floor dry. Clean up any water that spills on the floor as soon as it happens. Remove soap buildup in the tub or shower regularly. Attach bath mats securely with double-sided non-slip rug tape. Do not have throw rugs and other things on the floor that can make you trip. What can I do in the bedroom? Use night lights. Make sure that you have a light by your bed that is easy to reach. Do not use any sheets or blankets that are too big for your bed. They should not hang down onto the floor. Have a firm chair that has side arms. You can use this for support while you get dressed. Do not have throw rugs and other things on the floor that can make you trip. What can I do in the kitchen? Clean up any spills right away. Avoid walking on wet floors. Keep items that you use a lot in easy-to-reach places. If you need to reach something above you, use a strong step stool that has a grab bar.  Keep electrical cords out of the way. Do not use floor polish or wax that makes floors slippery. If you must use wax, use non-skid floor wax. Do not have throw rugs and other things on the floor that can make you trip. What can I do with my stairs? Do not leave any items on the stairs. Make sure that there are handrails on both sides of the stairs and use them. Fix handrails that are broken or loose. Make sure that handrails are as long as the stairways. Check any carpeting to make sure that it is firmly attached to the stairs. Fix any carpet that is loose or worn. Avoid having throw rugs at the top or bottom of the stairs. If you do have throw rugs, attach them to the floor with carpet tape. Make sure that you have a light switch at the top of the stairs and the bottom of the stairs. If you do not have them, ask someone to add them for you. What else can I do to help prevent falls? Wear  shoes that: Do not have high heels. Have rubber bottoms. Are comfortable and fit you well. Are closed at the toe. Do not wear sandals. If you use a stepladder: Make sure that it is fully opened. Do not climb a closed stepladder. Make sure that both sides of the stepladder are locked into place. Ask someone to hold it for you, if possible. Clearly mark and make sure that you can see: Any grab bars or handrails. First and last steps. Where the edge of each step is. Use tools that help you move around (mobility aids) if they are needed. These include: Canes. Walkers. Scooters. Crutches. Turn on the lights when you go into a dark area. Replace any light bulbs as soon as they burn out. Set up your furniture so you have a clear path. Avoid moving your furniture around. If any of your floors are uneven, fix them. If there are any pets around you, be aware of where they are. Review your medicines with your doctor. Some medicines can make you feel dizzy. This can increase your chance of falling. Ask your doctor what other things that you can do to help prevent falls. This information is not intended to replace advice given to you by your health care provider. Make sure you discuss any questions you have with your health care provider. Document Released: 05/07/2009 Document Revised: 12/17/2015 Document Reviewed: 08/15/2014 Elsevier Interactive Patient Education  2017 [employer].

## 2021-07-24 ENCOUNTER — Other Ambulatory Visit: Payer: Self-pay | Admitting: Family Medicine

## 2021-08-14 ENCOUNTER — Other Ambulatory Visit: Payer: Self-pay | Admitting: Family Medicine

## 2021-09-06 ENCOUNTER — Ambulatory Visit: Payer: Medicare Other | Admitting: Family Medicine

## 2021-09-08 ENCOUNTER — Ambulatory Visit: Payer: Medicare Other | Admitting: Family Medicine

## 2021-09-14 ENCOUNTER — Ambulatory Visit: Payer: Medicare Other | Admitting: Family Medicine

## 2021-09-14 ENCOUNTER — Encounter: Payer: Self-pay | Admitting: Family Medicine

## 2021-09-14 VITALS — BP 132/70 | HR 65 | Temp 98.6°F | Ht 75.0 in | Wt 207.5 lb

## 2021-09-14 DIAGNOSIS — E119 Type 2 diabetes mellitus without complications: Secondary | ICD-10-CM | POA: Diagnosis not present

## 2021-09-14 LAB — POCT GLYCOSYLATED HEMOGLOBIN (HGB A1C): Hemoglobin A1C: 8.4 % — AB (ref 4.0–5.6)

## 2021-09-14 MED ORDER — RYBELSUS 3 MG PO TABS: 3.0000 mg | ORAL_TABLET | Freq: Every day | ORAL | 5 refills | Status: DC

## 2021-09-14 NOTE — Patient Instructions (Signed)
Let me know if cost is prohibitive for the Rybelsus.  We might have to look at another option such as Januvia.

## 2021-09-14 NOTE — Progress Notes (Signed)
Established Patient Office Visit  Subjective:  Patient ID: Aaron Bradshaw, male    DOB: 1950-08-26  Age: 71 y.o. MRN: [id number]  CC:  Chief Complaint  Patient presents with   Follow-up    HPI Aaron Bradshaw presents for diabetes follow-up.  Aaron Bradshaw is currently on metformin and Jardiance.  We had written for Rybelsus but Aaron Bradshaw only took this for a brief while several months ago but cost was prohibitive.  Aaron Bradshaw is not sure but wonders if his insurance may cover this better now.  Aaron Bradshaw is still very active both with working part-time and also exercises daily.  No recent dietary changes.  Generally feels well.  Aaron Bradshaw is reluctant to use insulin.  Aaron Bradshaw is compliant with his metformin and Jardiance.  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    Past Surgical History:  Procedure Laterality Date   APPENDECTOMY     CHOLECYSTECTOMY  2010   COLONOSCOPY     Tics, Polyp 12/26/2003; polyps 2010, Dr Carlean Purl   KNEE ARTHROSCOPY     Left   LAPAROSCOPIC APPENDECTOMY N/A 10/05/2015   Procedure: APPENDECTOMY LAPAROSCOPIC;  Surgeon: Judeth Horn, MD;  Location: [facility];  Service: General;  Laterality: N/A;   NOSE SURGERY     Dr  Truman Hayward   POLYPECTOMY     ROTATOR CUFF REPAIR Bilateral    left   SHOULDER ARTHROSCOPY      Family History  Problem Relation Age of Onset   Depression Father    Heart disease Father        MI in 52s   Prostate cancer Father    OCD Father    Depression Mother    Arthritis Mother    Stroke Mother 5   Parkinsonism Mother     Diabetes Mother    Depression Sister        OCD   Prostate cancer Paternal Grandfather    Diabetes Maternal Grandfather        MI in 32s   Colon cancer Neg Hx    Colon polyps Neg Hx    Esophageal cancer Neg Hx    Rectal cancer Neg Hx    Stomach cancer Neg Hx     Social History   Socioeconomic History   Marital status: Married    Spouse name: Not on file   Number of children: Not on file   Years of education: Not on file   Highest education level: Not on file  Occupational History   Not on file  Tobacco Use   Smoking status: Never   Smokeless tobacco: Never  Vaping Use   Vaping Use: Never used  Substance and Sexual Activity   Alcohol use: Yes    Alcohol/week: 1.0 - 2.0 standard drink    Types: 1 - 2 Standard drinks or equivalent  per week    Comment: beer occ stopped wine   Drug use: No   Sexual activity: Not on file  Other Topics Concern   Not on file  Social History Narrative   Not on file   Social Determinants of Health   Financial Resource Strain: Low Risk    Difficulty of Paying Living Expenses: Not hard at all  Food Insecurity: No Food Insecurity   Worried About Charity fundraiser in the Last Year: Never true   [place] in the Last Year: Never true  Transportation Needs: No Transportation Needs   Lack of Transportation (Medical): No   Lack of Transportation (Non-Medical): No  Physical Activity: Sufficiently Active   Days of Exercise per Week: 7 days   Minutes of Exercise per Session: 70 min  Stress: No Stress Concern Present   Feeling of Stress : Not at all  Social Connections: Socially Integrated   Frequency of Communication with Friends and Family: More than three times a week   Frequency of Social Gatherings with Friends and Family: More than three times a week   Attends Religious Services: More than 4 times per year   Active Member of [employer] or Organizations: Yes   Attends Music therapist: More than 4 times per year   Marital  Status: Married  Human resources officer Violence: Not At Risk   Fear of Current or Ex-Partner: No   Emotionally Abused: No   Physically Abused: No   Sexually Abused: No    Outpatient Medications Prior to Visit  Medication Sig Dispense Refill   Ascorbic Acid (VITAMIN C) 250 MG tablet Take 250 mg by mouth daily.     b complex vitamins capsule Take 1 capsule by mouth daily.     Blood Glucose Monitoring Suppl (ONETOUCH VERIO) w/Device KIT Use 1-4 times daily as needed/directed DX E11.9 1 kit 0   CHOLECALCIFEROL PO Take 1 tablet by mouth daily.     co-enzyme Q-10 30 MG capsule Take 30 mg by mouth daily.     EPIPEN 2-PAK 0.3 MG/0.3ML SOAJ injection USE AS DIRECTED AS NEEDED  1   glucose blood (ONE TOUCH ULTRA TEST) test strip Use once daily.  Dx E11.9 100 each 3   imiquimod (ALDARA) 5 % cream APPLY 1 APPLICATION APPLY ON THE SKIN AS DIRECTED APPLY EVERY OTHER NIGHT FOR 4 WEEKS.     JARDIANCE 25 MG TABS tablet TAKE 1 TABLET (25 MG TOTAL) BY MOUTH DAILY BEFORE BREAKFAST. 30 tablet 11   KRILL OIL PO Take 1 capsule by mouth daily.     levothyroxine (SYNTHROID) 75 MCG tablet TAKE 1 TABLET BY MOUTH EVERY DAY 90 tablet 1   MAGNESIUM PO Take 1 tablet by mouth daily.     metFORMIN (GLUCOPHAGE-XR) 750 MG 24 hr tablet TAKE 1 TABLET BY MOUTH TWICE A DAY 180 tablet 0   neomycin-polymyxin b-dexamethasone (MAXITROL) 3.5-10000-0.1 SUSP INSTIL 1 DROP 4 TIMES DAILY IN RIGHT EYE FOR 5 DAYS THEN 2 TIMES DAILY FOR 5 DAYS     Omega-3 Fatty Acids (FISH OIL) 1000 MG CAPS Take by mouth.     PRESCRIPTION MEDICATION Inject 1 each into the skin every 7 (seven) days. Patient gets an allergy shot once a week at [employer] [facility] off of [address]     Probiotic Product (PROBIOTIC DAILY PO) Take by mouth daily.     rosuvastatin (CRESTOR) 10 MG tablet TAKE 1 TABLET BY MOUTH EVERY DAY 90 tablet 1   Turmeric 500  MG CAPS Take 1,000 mg by mouth daily.     Semaglutide (RYBELSUS) 3 MG TABS Take 3 mg by mouth daily. 30 tablet 5    Facility-Administered Medications Prior to Visit  Medication Dose Route Frequency Provider Last Rate Last Admin   0.9 %  sodium chloride infusion  500 mL Intravenous Once Gatha Mayer, MD        Allergies  Allergen Reactions   Codeine Nausea Only   Prozac [Fluoxetine Hcl] Anxiety    Increased anxiety    ROS Review of Systems  Constitutional:  Negative for appetite change and unexpected weight change.  Endocrine: Negative for polydipsia and polyuria.     Objective:    Physical Exam Vitals reviewed.  Constitutional:      Appearance: Normal appearance.  Cardiovascular:     Rate and Rhythm: Normal rate and regular rhythm.  Pulmonary:     Effort: Pulmonary effort is normal.     Breath sounds: Normal breath sounds.  Musculoskeletal:     Right lower leg: No edema.     Left lower leg: No edema.  Neurological:     Mental Status: Aaron Bradshaw is alert.    BP 132/70 (BP Location: Left Arm, Patient Position: Sitting, Cuff Size: Large)    Pulse 65    Temp 98.6 F (37 C) (Oral)    Ht _0  (1.905 m)    Wt 207 lb 8 oz (94.1 kg)    SpO2 96%    BMI 25.94 kg/m  Wt Readings from Last 3 Encounters:  09/14/21 207 lb 8 oz (94.1 kg)  07/12/21 204 lb 9.6 oz (92.8 kg)  06/04/21 206 lb 11.2 oz (93.8 kg)     Health Maintenance Due  Topic Date Due   COVID-19 Vaccine (4 - Booster for Pfizer series) 01/07/2020   URINE MICROALBUMIN  03/10/2021    There are no preventive care reminders to display for this patient.  Lab Results  Component Value Date   TSH 3.28 11/24/2020   Lab Results  Component Value Date   WBC 7.5 03/27/2018   HGB 15.8 03/27/2018   HCT 44.7 03/27/2018   MCV 87.2 03/27/2018   PLT 193.0 03/27/2018   Lab Results  Component Value Date   NA 138 11/24/2020   K 4.3 11/24/2020   CO2 28 11/24/2020   GLUCOSE 203 (H) 11/24/2020   BUN 27 (H) 11/24/2020   CREATININE 0.94 11/24/2020   BILITOT 1.4 (H) 11/24/2020   ALKPHOS 79 11/24/2020   AST 18 11/24/2020   ALT 18  11/24/2020   PROT 7.0 11/24/2020   ALBUMIN 4.4 11/24/2020   CALCIUM 9.6 11/24/2020   ANIONGAP 9 09/25/2015   GFR 82.96 11/24/2020   Lab Results  Component Value Date   CHOL 159 11/24/2020   Lab Results  Component Value Date   HDL 38.60 (L) 11/24/2020   Lab Results  Component Value Date   LDLCALC 84 11/24/2020   Lab Results  Component Value Date   TRIG 181.0 (H) 11/24/2020   Lab Results  Component Value Date   CHOLHDL 4 11/24/2020   Lab Results  Component Value Date   HGBA1C 8.4 (A) 09/14/2021      Assessment & Plan:   Problem List Items Addressed This Visit       Unprioritized   Type 2 diabetes mellitus, controlled ([place]) - Primary   Relevant Medications   Semaglutide (RYBELSUS) 3 MG TABS   Other Relevant Orders   POC HgB A1c (Completed)  A1c today 8.4%.  Unfortunately this has been climbing from 7.3 to 7 7.8 to 8.4.    -Aaron Bradshaw will try to get the Rybelsus filled again but if cost is prohibitive we have to look at other options. -We could look at [facility] though we explained this is much less effective medication -We did discuss long-acting insulin but Aaron Bradshaw is reluctant to start insulin at this time. -Set up 84-monthfollow-up.  We plan to get full labs at that point including lipids, chemistries, urine microalbumin, etc.  Meds ordered this encounter  Medications   Semaglutide (RYBELSUS) 3 MG TABS    Sig: Take 3 mg by mouth daily.    Dispense:  30 tablet    Refill:  5    Follow-up: Return in about 3 months (around 12/12/2021).    BCarolann Littler MD

## 2021-11-13 ENCOUNTER — Other Ambulatory Visit: Payer: Self-pay | Admitting: Family Medicine

## 2021-12-13 ENCOUNTER — Ambulatory Visit: Payer: Medicare Other | Admitting: Family Medicine

## 2021-12-13 ENCOUNTER — Encounter: Payer: Self-pay | Admitting: Family Medicine

## 2021-12-13 VITALS — BP 130/80 | HR 60 | Temp 98.1°F | Ht 75.0 in | Wt 206.2 lb

## 2021-12-13 DIAGNOSIS — E785 Hyperlipidemia, unspecified: Secondary | ICD-10-CM | POA: Diagnosis not present

## 2021-12-13 DIAGNOSIS — E119 Type 2 diabetes mellitus without complications: Secondary | ICD-10-CM | POA: Diagnosis not present

## 2021-12-13 DIAGNOSIS — E039 Hypothyroidism, unspecified: Secondary | ICD-10-CM | POA: Diagnosis not present

## 2021-12-13 LAB — POCT GLYCOSYLATED HEMOGLOBIN (HGB A1C): Hemoglobin A1C: 8.3 % — AB (ref 4.0–5.6)

## 2021-12-13 MED ORDER — SITAGLIPTIN PHOSPHATE 100 MG PO TABS: 100.0000 mg | ORAL_TABLET | Freq: Every day | ORAL | 5 refills | Status: DC

## 2021-12-13 NOTE — Patient Instructions (Signed)
Consider Dexcom or Freestyle Libre continuous glucose monitoring system and may want to check to see if insurance covers.    Set up labs for this Thursday.

## 2021-12-13 NOTE — Progress Notes (Signed)
Established Patient Office Visit  Subjective   Patient ID: Aaron Bradshaw, male    DOB: [birth year]  Age: 71 y.o. MRN: [id number]  Chief Complaint  Patient presents with   Follow-up    HPI   Aaron Bradshaw has history of type 2 diabetes, hypothyroidism, dyslipidemia, history of transverse myelitis.  Generally doing well.  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  He stays very active.  Diabetes has been suboptimally controlled.  Last A1c 8.4%.  We had started back low-dose Rybelsus but cost has been prohibitive.  He remains currently on metformin and Jardiance.  He had hypoglycemic issues with sulfonylurea.  Recent fasting blood sugars usually ranging between 130 and 140.  He is getting yearly eye exams.  Takes rosuvastatin for hyperlipidemia.  Due for follow-up labs.  He also has hypothyroidism treated with levothyroxine.  Needs follow-up labs for that as well.  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 He has never used smokeless tobacco. He reports current alcohol use of about 1.0 - 2.0 standard drink per week. He reports that he does not use drugs. family history includes Arthritis in his mother; Depression in his father, mother, and sister; Diabetes in his maternal grandfather and mother; Heart disease in his father; OCD in his father; Parkinsonism in his mother; Prostate cancer in his father and paternal grandfather; Stroke (age of onset: 81) in his mother. Allergies  Allergen Reactions   Codeine Nausea Only   Prozac [Fluoxetine Hcl] Anxiety    Increased anxiety    Review of Systems  Constitutional:  Negative for weight loss.  Respiratory:  Negative for shortness of breath.   Cardiovascular:  Negative for chest pain.  Genitourinary:  Negative for dysuria.  Neurological:  Negative for dizziness.     Objective:     BP 130/80 (BP Location: Left Arm, Patient Position: Sitting, Cuff Size: Normal)   Pulse 60   Temp 98.1 F (36.7 C) (Oral)   Ht '6\' 3"'$  (1.905 m)   Wt 206 lb 3.2 oz (93.5 kg)   SpO2 98%   BMI 25.77 kg/m    Physical Exam  Vitals reviewed.  Constitutional:      Appearance: Normal appearance.  Cardiovascular:     Rate and Rhythm: Normal rate and regular rhythm.  Pulmonary:     Effort: Pulmonary effort is normal.     Breath sounds: Normal breath sounds.  Musculoskeletal:     Right lower leg: No edema.     Left lower leg: No edema.  Neurological:     General: No focal deficit present.     Mental Status: He is alert.     Results for orders placed or performed in visit on 12/13/21  POCT glycosylated hemoglobin (Hb A1C)  Result Value Ref Range   Hemoglobin A1C 8.3 (A) 4.0 - 5.6 %   HbA1c POC (<> result, manual entry)     HbA1c, POC (prediabetic range)     HbA1c, POC (controlled diabetic range)        The 10-year ASCVD risk score (Arnett DK, et al., 2019) is: 32.7%    Assessment & Plan:   #1 type 2 diabetes  suboptimally controlled with A1c 8.3%.  We tried GLP-1 medication but cost was prohibitive.  -Consider Januvia 100 mg daily if we can get this covered -Continue Jardiance and metformin at current dosage -Would like to avoid sulfonylurea if possible secondary to past history of hypoglycemia with those medications. -We did discuss other potential options such as Actos or even long-acting insulin -Also discuss continuous glucose monitoring systems and he will check on insurance coverage -Set up 6-monthfollow-up -Check urine microalbumin screen  #2 hypothyroidism.  Patient on levothyroxine.  Recheck TSH with upcoming labs  #3 dyslipidemia.  Treated with rosuvastatin.  Recheck fasting lipid and hepatic panel with upcoming labs   Return in about 3 months (around 03/15/2022).    BCarolann Littler MD

## 2021-12-16 ENCOUNTER — Other Ambulatory Visit (INDEPENDENT_AMBULATORY_CARE_PROVIDER_SITE_OTHER): Payer: Medicare Other

## 2021-12-16 DIAGNOSIS — E119 Type 2 diabetes mellitus without complications: Secondary | ICD-10-CM

## 2021-12-16 DIAGNOSIS — E785 Hyperlipidemia, unspecified: Secondary | ICD-10-CM

## 2021-12-16 DIAGNOSIS — E039 Hypothyroidism, unspecified: Secondary | ICD-10-CM | POA: Diagnosis not present

## 2021-12-16 LAB — LIPID PANEL
Cholesterol: 131 mg/dL (ref 0–200)
HDL: 39.2 mg/dL (ref >39.00)
LDL Cholesterol: 67 mg/dL (ref 0–99)
NonHDL: 92.23
Total CHOL/HDL Ratio: 3
Triglycerides: 124 mg/dL (ref 0.0–149.0)
VLDL: 24.8 mg/dL (ref 0.0–40.0)

## 2021-12-16 LAB — MICROALBUMIN / CREATININE URINE RATIO
Creatinine,U: 78.1 mg/dL
Microalb Creat Ratio: 0.9 mg/g (ref 0.0–30.0)
Microalb, Ur: <0.7 mg/dL (ref 0.0–1.9)

## 2021-12-16 LAB — HEPATIC FUNCTION PANEL
ALT: 18 U/L (ref 0–53)
AST: 17 U/L (ref 0–37)
Albumin: 4.3 g/dL (ref 3.5–5.2)
Alkaline Phosphatase: 67 U/L (ref 39–117)
Bilirubin, Direct: 0.3 mg/dL (ref 0.0–0.3)
Total Bilirubin: 1.8 mg/dL — ABNORMAL HIGH (ref 0.2–1.2)
Total Protein: 6.8 g/dL (ref 6.0–8.3)

## 2021-12-16 LAB — BASIC METABOLIC PANEL
BUN: 22 mg/dL (ref 6–23)
CO2: 29 mEq/L (ref 19–32)
Calcium: 9.1 mg/dL (ref 8.4–10.5)
Chloride: 99 mEq/L (ref 96–112)
Creatinine, Ser: 0.91 mg/dL (ref 0.40–1.50)
GFR: 85.62 mL/min (ref >60.00)
Glucose, Bld: 178 mg/dL — ABNORMAL HIGH (ref 70–99)
Potassium: 4.3 mEq/L (ref 3.5–5.1)
Sodium: 137 mEq/L (ref 135–145)

## 2021-12-16 LAB — TSH: TSH: 3.99 u[IU]/mL (ref 0.35–5.50)

## 2022-01-27 DIAGNOSIS — J3081 Allergic rhinitis due to animal (cat) (dog) hair and dander: Secondary | ICD-10-CM | POA: Diagnosis not present

## 2022-01-27 DIAGNOSIS — J301 Allergic rhinitis due to pollen: Secondary | ICD-10-CM | POA: Diagnosis not present

## 2022-01-27 DIAGNOSIS — J3089 Other allergic rhinitis: Secondary | ICD-10-CM | POA: Diagnosis not present

## 2022-02-03 DIAGNOSIS — L821 Other seborrheic keratosis: Secondary | ICD-10-CM | POA: Diagnosis not present

## 2022-02-03 DIAGNOSIS — L570 Actinic keratosis: Secondary | ICD-10-CM | POA: Diagnosis not present

## 2022-02-03 DIAGNOSIS — Z85828 Personal history of other malignant neoplasm of skin: Secondary | ICD-10-CM | POA: Diagnosis not present

## 2022-02-03 DIAGNOSIS — D485 Neoplasm of uncertain behavior of skin: Secondary | ICD-10-CM | POA: Diagnosis not present

## 2022-02-03 DIAGNOSIS — L853 Xerosis cutis: Secondary | ICD-10-CM | POA: Diagnosis not present

## 2022-02-03 DIAGNOSIS — D045 Carcinoma in situ of skin of trunk: Secondary | ICD-10-CM | POA: Diagnosis not present

## 2022-02-07 ENCOUNTER — Other Ambulatory Visit: Payer: Self-pay | Admitting: Family Medicine

## 2022-02-16 ENCOUNTER — Other Ambulatory Visit: Payer: Self-pay | Admitting: Family Medicine

## 2022-03-21 DIAGNOSIS — J3089 Other allergic rhinitis: Secondary | ICD-10-CM | POA: Diagnosis not present

## 2022-03-21 DIAGNOSIS — J301 Allergic rhinitis due to pollen: Secondary | ICD-10-CM | POA: Diagnosis not present

## 2022-03-21 DIAGNOSIS — J3081 Allergic rhinitis due to animal (cat) (dog) hair and dander: Secondary | ICD-10-CM | POA: Diagnosis not present

## 2022-04-03 ENCOUNTER — Other Ambulatory Visit: Payer: Self-pay | Admitting: Family Medicine

## 2022-04-13 DIAGNOSIS — J3089 Other allergic rhinitis: Secondary | ICD-10-CM | POA: Diagnosis not present

## 2022-04-13 DIAGNOSIS — J3081 Allergic rhinitis due to animal (cat) (dog) hair and dander: Secondary | ICD-10-CM | POA: Diagnosis not present

## 2022-04-13 DIAGNOSIS — J301 Allergic rhinitis due to pollen: Secondary | ICD-10-CM | POA: Diagnosis not present

## 2022-04-18 DIAGNOSIS — E119 Type 2 diabetes mellitus without complications: Secondary | ICD-10-CM | POA: Diagnosis not present

## 2022-04-18 DIAGNOSIS — H31001 Unspecified chorioretinal scars, right eye: Secondary | ICD-10-CM | POA: Diagnosis not present

## 2022-04-18 DIAGNOSIS — H2513 Age-related nuclear cataract, bilateral: Secondary | ICD-10-CM | POA: Diagnosis not present

## 2022-04-18 DIAGNOSIS — H5203 Hypermetropia, bilateral: Secondary | ICD-10-CM | POA: Diagnosis not present

## 2022-04-18 LAB — HM DIABETES EYE EXAM

## 2022-04-25 ENCOUNTER — Encounter: Payer: Self-pay | Admitting: Family Medicine

## 2022-05-18 ENCOUNTER — Encounter: Payer: Self-pay | Admitting: Family Medicine

## 2022-05-18 ENCOUNTER — Ambulatory Visit (INDEPENDENT_AMBULATORY_CARE_PROVIDER_SITE_OTHER): Payer: PPO | Admitting: Family Medicine

## 2022-05-18 VITALS — BP 110/62 | HR 60 | Temp 98.1°F | Ht 75.0 in | Wt 204.0 lb

## 2022-05-18 DIAGNOSIS — E039 Hypothyroidism, unspecified: Secondary | ICD-10-CM | POA: Diagnosis not present

## 2022-05-18 DIAGNOSIS — Z23 Encounter for immunization: Secondary | ICD-10-CM

## 2022-05-18 DIAGNOSIS — E785 Hyperlipidemia, unspecified: Secondary | ICD-10-CM | POA: Diagnosis not present

## 2022-05-18 DIAGNOSIS — E119 Type 2 diabetes mellitus without complications: Secondary | ICD-10-CM | POA: Diagnosis not present

## 2022-05-18 LAB — POCT GLYCOSYLATED HEMOGLOBIN (HGB A1C): Hemoglobin A1C: 8.4 % — AB (ref 4.0–5.6)

## 2022-05-18 MED ORDER — ONETOUCH VERIO VI STRP: ORAL_STRIP | 3 refills | Status: DC

## 2022-05-18 MED ORDER — ONETOUCH DELICA LANCETS 33G MISC: 3 refills | Status: AC

## 2022-05-18 MED ORDER — OZEMPIC (0.25 OR 0.5 MG/DOSE) 2 MG/3ML ~~LOC~~ SOPN: 0.2500 mg | PEN_INJECTOR | SUBCUTANEOUS | 2 refills | Status: DC

## 2022-05-18 NOTE — Addendum Note (Signed)
Addended by: Eulas Post on: 05/18/2022 12:28 PM   Modules accepted: Orders

## 2022-05-18 NOTE — Patient Instructions (Signed)
Start the Ozempic 0.25 mg [place] once weekly  Watch for any side effects such as nausea  Set up 3 month follow up.

## 2022-05-18 NOTE — Progress Notes (Signed)
Established Patient Office Visit  Subjective   Patient ID: JAYCUB NOORANI, male    DOB: 1950-12-12  Age: 71 y.o. MRN: [id number]  Chief Complaint  Patient presents with   Follow-up    HPI   [name] is seen for medical follow-up.  He has history of type 2 diabetes, hypothyroidism, hyperlipidemia.  His current medications include clued Jardiance, metformin extended release, levothyroxine, rosuvastatin.  His diabetes has been suboptimally controlled.  He has been reluctant to consider other medications.  He briefly took Rybelsus but had issues with cost.  Previous intolerance with sulfonylurea.  Did not see much benefit with Januvia.  Fasting blood sugar this morning 210.  A1c has recently been over 8.  We have strongly urged consideration for further medication.  He remains very reluctant to consider insulin.  Eye exams are up-to-date.  Microalbumin screen in May was normal.  He is still very diligent with exercising most days of the week.  Frequently swims and also stays very engaged with things like yard work.  No recent chest pains.  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 He has never used smokeless tobacco. He reports current alcohol use of about 1.0 - 2.0 standard drink of alcohol per week. He reports that he does not use drugs. family history includes Arthritis in his mother; Depression in his father, mother, and sister; Diabetes in his maternal grandfather and mother; Heart disease in his father; OCD in his father; Parkinsonism in his mother; Prostate cancer in his father and paternal grandfather; Stroke (age of onset: 24) in his mother. Allergies  Allergen Reactions   Codeine Nausea Only   Prozac [Fluoxetine Hcl] Anxiety    Increased anxiety    Review of Systems  Constitutional:  Negative for malaise/fatigue.  Eyes:  Negative for blurred vision.  Respiratory:  Negative for shortness of breath.   Cardiovascular:  Negative for chest pain.  Gastrointestinal:  Negative for abdominal pain.  Neurological:  Negative for dizziness, weakness and headaches.      Objective:     BP 110/62 (BP Location: Left Arm, Patient Position: Sitting, Cuff Size: Large)   Pulse 60   Temp 98.1 F (36.7  C) (Oral)   Ht '6\' 3"'$  (1.905 m)   Wt 204 lb (92.5 kg)   SpO2 99%   BMI 25.50 kg/m  BP Readings from Last 3 Encounters:  05/18/22 110/62  12/13/21 130/80  09/14/21 132/70   Wt Readings from Last 3 Encounters:  05/18/22 204 lb (92.5 kg)  12/13/21 206 lb 3.2 oz (93.5 kg)  09/14/21 207 lb 8 oz (94.1 kg)      Physical Exam Constitutional:      Appearance: He is well-developed.  HENT:     Right Ear: External ear normal.     Left Ear: External ear normal.  Eyes:     Pupils: Pupils are equal, round, and reactive to light.  Neck:     Thyroid: No thyromegaly.  Cardiovascular:     Rate and Rhythm: Normal rate and regular rhythm.  Pulmonary:     Effort: Pulmonary effort is normal. No respiratory distress.     Breath  sounds: Normal breath sounds. No wheezing or rales.  Musculoskeletal:     Cervical back: Neck supple.  Skin:    Comments: Feet reveal no skin lesions.  Skin is fairly dry but no callus formation.  Good distal foot pulses. Good capillary refill. Normal sensation with monofilament testing   Neurological:     Mental Status: He is alert and oriented to person, place, and time.      Results for orders placed or performed in visit on 05/18/22  POC HgB A1c  Result Value Ref Range   Hemoglobin A1C 8.4 (A) 4.0 - 5.6 %   HbA1c POC (<> result, manual entry)     HbA1c, POC (prediabetic range)     HbA1c, POC (controlled diabetic range)      Last CBC Lab Results  Component Value Date   WBC 7.5 03/27/2018   HGB 15.8 03/27/2018   HCT 44.7 03/27/2018   MCV 87.2 03/27/2018   MCH 30.1 09/25/2015   RDW 13.3 03/27/2018   PLT 193.0 96/10/5407   Last metabolic panel Lab Results  Component Value Date   GLUCOSE 178 (H) 12/16/2021   NA 137 12/16/2021   K 4.3 12/16/2021   CL 99 12/16/2021   CO2 29 12/16/2021   BUN 22 12/16/2021   CREATININE 0.91 12/16/2021   GFRNONAA >60 09/25/2015   CALCIUM 9.1 12/16/2021   PROT 6.8 12/16/2021   ALBUMIN 4.3 12/16/2021   BILITOT 1.8 (H) 12/16/2021   ALKPHOS 67 12/16/2021   AST 17 12/16/2021   ALT 18 12/16/2021   ANIONGAP 9 09/25/2015   Last lipids Lab Results  Component Value Date   CHOL 131 12/16/2021   HDL 39.20 12/16/2021   LDLCALC 67 12/16/2021   LDLDIRECT 112.0 01/03/2017   TRIG 124.0 12/16/2021   CHOLHDL 3 12/16/2021   Last hemoglobin A1c Lab Results  Component Value Date   HGBA1C 8.4 (A) 05/18/2022   Last thyroid functions Lab Results  Component Value Date   TSH 3.99 12/16/2021      The 10-year ASCVD risk score (Arnett DK, et al., 2019) is: 23.2%    Assessment & Plan:   #1 type 2 diabetes poorly controlled with A1c 8.4% today.  Patient on Jardiance 25 mg daily and metformin extended release 750 mg twice daily.  We strongly  recommend he consider GLP-1 medication.  Cost was an issue previously but has new insurance now.  He does not have any contraindications such as history of pancreatitis or family history of medullary thyroid cancer  -Set  up diabetic education consult -Start Ozempic 0.25 mg subcutaneous once weekly.  Consider increase to 0.5 mg subcutaneous once weekly in 1 month if tolerating well and blood sugars not closer to goal -Set up 81-monthfollow-up -Continue yearly diabetic eye exam  #2 hyperlipidemia.  Treated with rosuvastatin.  Consider lipids at follow-up  #3 hypothyroidism.  Patient on levothyroxine 75 mcg daily.  Recent TSH in May at goal   Return in about 3 months (around 08/18/2022).    BCarolann Littler MD

## 2022-05-18 NOTE — Addendum Note (Signed)
Addended by: Agnes Lawrence on: 05/18/2022 12:25 PM   Modules accepted: Orders

## 2022-05-19 DIAGNOSIS — J301 Allergic rhinitis due to pollen: Secondary | ICD-10-CM | POA: Diagnosis not present

## 2022-05-19 DIAGNOSIS — J3081 Allergic rhinitis due to animal (cat) (dog) hair and dander: Secondary | ICD-10-CM | POA: Diagnosis not present

## 2022-05-19 DIAGNOSIS — J3089 Other allergic rhinitis: Secondary | ICD-10-CM | POA: Diagnosis not present

## 2022-05-25 ENCOUNTER — Encounter: Payer: PPO | Attending: Family Medicine | Admitting: Dietician

## 2022-05-25 ENCOUNTER — Encounter: Payer: Self-pay | Admitting: Dietician

## 2022-05-25 DIAGNOSIS — E119 Type 2 diabetes mellitus without complications: Secondary | ICD-10-CM | POA: Diagnosis not present

## 2022-05-25 NOTE — Progress Notes (Signed)
Diabetes Self-Management Education  Visit Type: First/Initial  Appt. Start Time: 3154 Appt. End Time: 1022  05/25/2022  [name], identified by name and date of birth, is a 71 y.o. male with a diagnosis of Diabetes: Type 2.   ASSESSMENT  Primary concern: pt states it has been difficult to bring his a1c down and he was recently prescribed ozempic for further control but he has not picked up up and is not sure if he will continue it due to cost. He states he has not had diabetes education in 8-10 years.   History includes: type 2 diabetes, allergies, anxiety, cancer, heart murmur, HLD, thyroid disease.  Labs noted: 05/18/22: A1C 8.4% Medications include: metformin, jardiance, ozempic (hasn't started).  Supplements: probiotic, co-Q10, krill oil, turmeric, magnesium, B complex, omega 3's, MVI, vitamin C, vitamin D.   Patient lives with wife. Wife does shopping and cooking for dinner. Pt states he makes his breakfast and lunch.  Pt is retired IT trainer and states his career was stressful and affected his sleep schedule. Pt sleeps 5-6 hours a night.   Pt states he's been watching what he has been eating the last few days and had a fasting blood glucose of 134 this morning which was lower than usual.   Pt is highly active. Exercises for at least 1 hour daily. Pt swims, does weight lifting, elliptical, stair climber, etc.   Pt states they grow most of the vegetables they eat in their garden and get eggs from their daughter.   Pt reports he was 240lbs when he was diagnosed with type 2 diabetes 10 years ago and now has remained stable at around 200-205lbs.   Height '6\' 3"'$  (1.905 m), weight 202 lb 11.2 oz (91.9 kg). Body mass index is 25.34 kg/m.   Diabetes Self-Management Education - 05/25/22 0928       Visit Information   Visit Type First/Initial      Initial Visit   Diabetes Type Type 2    Date Diagnosed 2013    Are you currently following a meal plan? No    Are  you taking your medications as prescribed? Yes      Health Coping   How would you rate your overall health? Good      Psychosocial Assessment   Patient Belief/Attitude about Diabetes Motivated to manage diabetes    What is the hardest part about your diabetes right now, causing you the most concern, or is the most worrisome to you about your diabetes?   Making healty food and beverage choices    Self-care barriers None    Self-management support Doctor's office    Other persons present Patient    Patient Concerns Healthy Lifestyle    Special Needs None    Preferred Learning Style No preference indicated    Learning Readiness Ready    How often do you need to have someone help you when you read instructions, pamphlets, or other written materials from your doctor or pharmacy? 1 - Never    What is the last grade level you completed in school? 12th      Pre-Education Assessment   Patient understands the diabetes disease and treatment process. Needs Instruction    Patient understands incorporating nutritional management into lifestyle. Needs Instruction    Patient undertands incorporating physical activity into lifestyle. Needs Instruction    Patient understands using medications safely. Needs Instruction    Patient understands monitoring blood glucose, interpreting and using results Needs Instruction  Patient understands prevention, detection, and treatment of acute complications. Needs Instruction    Patient understands prevention, detection, and treatment of chronic complications. Needs Instruction    Patient understands how to develop strategies to address psychosocial issues. Needs Instruction    Patient understands how to develop strategies to promote health/change behavior. Needs Instruction      Complications   Last HgB A1C per patient/outside source 6.4 %    How often do you check your blood sugar? 3-4 times / week    Fasting Blood glucose range (mg/dL) 130-179;180-200     Number of hypoglycemic episodes per month 0    Have you had a dilated eye exam in the past 12 months? Yes    Have you had a dental exam in the past 12 months? Yes    Are you checking your feet? Yes    How many days per week are you checking your feet? 7      Dietary Intake   Breakfast oatmeal with honey and fruit OR cereal (grain berry or cheerios with fruit) OR eggs with sausage and [employer] toast    Snack (morning) none OR protein bar    Lunch 1-2 meat and cheese sandwiches with carrots OR tuna salad    Snack (afternoon) apple OR glass of milk    Dinner meat with vegetables OR salad and starch    Snack (evening) SF cookies OR oikos triple zero yogurt with fruit    Beverage(s) coffee, decaf tea, 64oz water      Activity / Exercise   Activity / Exercise Type Moderate (swimming / aerobic walking)    How many days per week do you exercise? 7    How many minutes per day do you exercise? 60    Total minutes per week of exercise 420      Patient Education   Previous Diabetes Education Yes (please comment)   8-10 years ago   Disease Pathophysiology Definition of diabetes, type 1 and 2, and the diagnosis of diabetes;Factors that contribute to the development of diabetes;Explored patient's options for treatment of their diabetes    Healthy Eating Role of diet in the treatment of diabetes and the relationship between the three main macronutrients and blood glucose level;Food label reading, portion sizes and measuring food.;Plate Method;Carbohydrate counting;Reviewed blood glucose goals for pre and post meals and how to evaluate the patients' food intake on their blood glucose level.;Meal timing in regards to the patients' current diabetes medication.;Information on hints to eating out and maintain blood glucose control.;Meal options for control of blood glucose level and chronic complications.    Being Active Role of exercise on diabetes management, blood pressure control and cardiac health.;Identified  with patient nutritional and/or medication changes necessary with exercise.;Helped patient identify appropriate exercises in relation to his/her diabetes, diabetes complications and other health issue.    Medications Reviewed patients medication for diabetes, action, purpose, timing of dose and side effects.;Reviewed medication adjustment guidelines for hyperglycemia and sick days.    Monitoring Identified appropriate SMBG and/or A1C goals.;Daily foot exams;Yearly dilated eye exam    Acute complications Taught prevention, symptoms, and  treatment of hypoglycemia - the 15 rule.;Discussed and identified patients' prevention, symptoms, and treatment of hyperglycemia.;Educated on sick day management    Chronic complications Relationship between chronic complications and blood glucose control;Assessed and discussed foot care and prevention of foot problems;Lipid levels, blood glucose control and heart disease;Identified and discussed with patient  current chronic complications;Dental care;Retinopathy and reason for yearly dilated eye  exams;Nephropathy, what it is, prevention of, the use of ACE, ARB's and early detection of through urine microalbumia.;Reviewed with patient heart disease, higher risk of, and prevention    Diabetes Stress and Support Identified and addressed patients feelings and concerns about diabetes;Worked with patient to identify barriers to care and solutions;Role of stress on diabetes    Lifestyle and Health Coping Lifestyle issues that need to be addressed for better diabetes care      Individualized Goals (developed by patient)   Nutrition General guidelines for healthy choices and portions discussed    Physical Activity Exercise 3-5 times per week;Exercise 5-7 days per week;30 minutes per day    Medications take my medication as prescribed    Monitoring  Test my blood glucose as discussed    Problem Solving Eating Pattern    Reducing Risk examine blood glucose patterns;do foot checks  daily;treat hypoglycemia with 15 grams of carbs if blood glucose less than '70mg'$ /dL    Health Coping Ask for help with psychological, social, or emotional issues      Post-Education Assessment   Patient understands the diabetes disease and treatment process. Comprehends key points    Patient understands incorporating nutritional management into lifestyle. Comprehends key points    Patient undertands incorporating physical activity into lifestyle. Demonstrates understanding / competency    Patient understands using medications safely. Demonstrates understanding / competency    Patient understands monitoring blood glucose, interpreting and using results Comprehends key points    Patient understands prevention, detection, and treatment of acute complications. Comprehends key points    Patient understands prevention, detection, and treatment of chronic complications. Comprehends key points    Patient understands how to develop strategies to address psychosocial issues. Comprehends key points    Patient understands how to develop strategies to promote health/change behavior. Comprehends key points      Outcomes   Expected Outcomes Demonstrated interest in learning. Expect positive outcomes    Future DMSE 3-4 months    Program Status Not Completed             Individualized Plan for Diabetes Self-Management Training:   Learning Objective:  Patient will have a greater understanding of diabetes self-management. Patient education plan is to attend individual and/or group sessions per assessed needs and concerns.   Plan:   Patient Instructions  Aim for 150 minutes of physical activity weekly.  Eat more Non-Starchy Vegetables. Make 1/2 of your plate non-starchy at least twice per day.  Minimize added sugars and refined grains.  Rethink what you drink. Choose beverages without added sugar. Look for 0 carbs on the label.  Choose whole foods over processed.  Make simple meals at home more  often than eating out.  Aim to eat within 1-2 hours of waking up and every 3-5 hours following.   When snacking, make sure to choose a carb and protein.   If having cereal and fruit OR oatmeal and fruit, aim to include a protein!   Expected Outcomes:  Demonstrated interest in learning. Expect positive outcomes  Education material provided: ADA - How to Thrive: A Guide for Your Journey with Diabetes and My Plate, Balanced Plate Food Groups  If problems or questions, patient to contact team via:  Phone  Future DSME appointment: 3-4 months

## 2022-05-25 NOTE — Patient Instructions (Signed)
Aim for 150 minutes of physical activity weekly.  Eat more Non-Starchy Vegetables. Make 1/2 of your plate non-starchy at least twice per day.  Minimize added sugars and refined grains.  Rethink what you drink. Choose beverages without added sugar. Look for 0 carbs on the label.  Choose whole foods over processed.  Make simple meals at home more often than eating out.  Aim to eat within 1-2 hours of waking up and every 3-5 hours following.   When snacking, make sure to choose a carb and protein.   If having cereal and fruit OR oatmeal and fruit, aim to include a protein!

## 2022-05-26 DIAGNOSIS — J3089 Other allergic rhinitis: Secondary | ICD-10-CM | POA: Diagnosis not present

## 2022-05-26 DIAGNOSIS — J3081 Allergic rhinitis due to animal (cat) (dog) hair and dander: Secondary | ICD-10-CM | POA: Diagnosis not present

## 2022-05-26 DIAGNOSIS — J301 Allergic rhinitis due to pollen: Secondary | ICD-10-CM | POA: Diagnosis not present

## 2022-05-27 ENCOUNTER — Other Ambulatory Visit: Payer: Self-pay

## 2022-05-27 ENCOUNTER — Other Ambulatory Visit: Payer: Self-pay | Admitting: Family Medicine

## 2022-05-31 DIAGNOSIS — J301 Allergic rhinitis due to pollen: Secondary | ICD-10-CM | POA: Diagnosis not present

## 2022-05-31 DIAGNOSIS — J3081 Allergic rhinitis due to animal (cat) (dog) hair and dander: Secondary | ICD-10-CM | POA: Diagnosis not present

## 2022-05-31 DIAGNOSIS — J3089 Other allergic rhinitis: Secondary | ICD-10-CM | POA: Diagnosis not present

## 2022-06-06 DIAGNOSIS — J3081 Allergic rhinitis due to animal (cat) (dog) hair and dander: Secondary | ICD-10-CM | POA: Diagnosis not present

## 2022-06-06 DIAGNOSIS — J301 Allergic rhinitis due to pollen: Secondary | ICD-10-CM | POA: Diagnosis not present

## 2022-06-06 DIAGNOSIS — J3089 Other allergic rhinitis: Secondary | ICD-10-CM | POA: Diagnosis not present

## 2022-06-14 DIAGNOSIS — J3089 Other allergic rhinitis: Secondary | ICD-10-CM | POA: Diagnosis not present

## 2022-06-14 DIAGNOSIS — J301 Allergic rhinitis due to pollen: Secondary | ICD-10-CM | POA: Diagnosis not present

## 2022-06-14 DIAGNOSIS — J3081 Allergic rhinitis due to animal (cat) (dog) hair and dander: Secondary | ICD-10-CM | POA: Diagnosis not present

## 2022-06-23 DIAGNOSIS — J3089 Other allergic rhinitis: Secondary | ICD-10-CM | POA: Diagnosis not present

## 2022-06-23 DIAGNOSIS — J3081 Allergic rhinitis due to animal (cat) (dog) hair and dander: Secondary | ICD-10-CM | POA: Diagnosis not present

## 2022-06-23 DIAGNOSIS — J301 Allergic rhinitis due to pollen: Secondary | ICD-10-CM | POA: Diagnosis not present

## 2022-07-12 DIAGNOSIS — J3081 Allergic rhinitis due to animal (cat) (dog) hair and dander: Secondary | ICD-10-CM | POA: Diagnosis not present

## 2022-07-12 DIAGNOSIS — J301 Allergic rhinitis due to pollen: Secondary | ICD-10-CM | POA: Diagnosis not present

## 2022-07-12 DIAGNOSIS — J3089 Other allergic rhinitis: Secondary | ICD-10-CM | POA: Diagnosis not present

## 2022-07-13 ENCOUNTER — Ambulatory Visit (INDEPENDENT_AMBULATORY_CARE_PROVIDER_SITE_OTHER): Payer: PPO

## 2022-07-13 VITALS — BP 120/60 | HR 60 | Temp 97.8°F | Ht 75.0 in | Wt 203.8 lb

## 2022-07-13 DIAGNOSIS — Z0000 Encounter for general adult medical examination without abnormal findings: Secondary | ICD-10-CM | POA: Diagnosis not present

## 2022-07-13 NOTE — Patient Instructions (Addendum)
Aaron Bradshaw , Thank you for taking time to come for your Medicare Wellness Visit. I appreciate your ongoing commitment to your health goals. Please review the following plan we discussed and let me know if I can assist you in the future.   These are the goals we discussed:  Goals       Patient Stated      Maintain current weight 204.6 Continue to monitor your BS Continue your great exercise plan.       Stay Healthy (pt-stated)        This is a list of the screening recommended for you and due dates:  Health Maintenance  Topic Date Due   COVID-19 Vaccine (4 - 2023-24 season) 07/29/2022*   Hemoglobin A1C  11/17/2022   Yearly kidney function blood test for diabetes  12/17/2022   Yearly kidney health urinalysis for diabetes  12/17/2022   Eye exam for diabetics  04/19/2023   Complete foot exam   05/19/2023   Medicare Annual Wellness Visit  07/14/2023   Colon Cancer Screening  01/22/2024   DTaP/Tdap/Td vaccine (2 - Td or Tdap) 01/04/2027   Pneumonia Vaccine  Completed   Flu Shot  Completed   Hepatitis C Screening: USPSTF Recommendation to screen - Ages 24-79 yo.  Completed   Zoster (Shingles) Vaccine  Completed   HPV Vaccine  Aged Out  *Topic was postponed. The date shown is not the original due date.    Advanced directives: Please bring a copy of your health care power of attorney and living will to the office to be added to your chart at your convenience.   Conditions/risks identified: None  Next appointment: Follow up in one year for your annual wellness visit.   Preventive Care 70 Years and Older, Male  Preventive care refers to lifestyle choices and visits with your health care provider that can promote health and wellness. What does preventive care include? A yearly physical exam. This is also called an annual well check. Dental exams once or twice a year. Routine eye exams. Ask your health care provider how often you should have your eyes checked. Personal  lifestyle choices, including: Daily care of your teeth and gums. Regular physical activity. Eating a healthy diet. Avoiding tobacco and drug use. Limiting alcohol use. Practicing safe sex. Taking low doses of aspirin every day. Taking vitamin and mineral supplements as recommended by your health care provider. What happens during an annual well check? The services and screenings done by your health care provider during your annual well check will depend on your age, overall health, lifestyle risk factors, and family history of disease. Counseling  Your health care provider may ask you questions about your: Alcohol use. Tobacco use. Drug use. Emotional well-being. Home and relationship well-being. Sexual activity. Eating habits. History of falls. Memory and ability to understand (cognition). Work and work Statistician. Screening  You may have the following tests or measurements: Height, weight, and BMI. Blood pressure. Lipid and cholesterol levels. These may be checked every 5 years, or more frequently if you are over 10 years old. Skin check. Lung cancer screening. You may have this screening every year starting at age 70 if you have a 30-pack-year history of smoking and currently smoke or have quit within the past 15 years. Fecal occult blood test (FOBT) of the stool. You may have this test every year starting at age 70. Flexible sigmoidoscopy or colonoscopy. You may have a sigmoidoscopy every 5 years or a colonoscopy every 10  years starting at age 70. Prostate cancer screening. Recommendations will vary depending on your family history and other risks. Hepatitis C blood test. Hepatitis B blood test. Sexually transmitted disease (STD) testing. Diabetes screening. This is done by checking your blood sugar (glucose) after you have not eaten for a while (fasting). You may have this done every 1-3 years. Abdominal aortic aneurysm (AAA) screening. You may need this if you are a  current or former smoker. Osteoporosis. You may be screened starting at age 70 if you are at high risk. Talk with your health care provider about your test results, treatment options, and if necessary, the need for more tests. Vaccines  Your health care provider may recommend certain vaccines, such as: Influenza vaccine. This is recommended every year. Tetanus, diphtheria, and acellular pertussis (Tdap, Td) vaccine. You may need a Td booster every 10 years. Zoster vaccine. You may need this after age 70. Pneumococcal 13-valent conjugate (PCV13) vaccine. One dose is recommended after age 72. Pneumococcal polysaccharide (PPSV23) vaccine. One dose is recommended after age 65. Talk to your health care provider about which screenings and vaccines you need and how often you need them. This information is not intended to replace advice given to you by your health care provider. Make sure you discuss any questions you have with your health care provider. Document Released: 08/07/2015 Document Revised: 03/30/2016 Document Reviewed: 05/12/2015 Elsevier Interactive Patient Education  2017 [employer] Prevention in the Home Falls can cause injuries. They can happen to people of all ages. There are many things you can do to make your home safe and to help prevent falls. What can I do on the outside of my home? Regularly fix the edges of walkways and driveways and fix any cracks. Remove anything that might make you trip as you walk through a door, such as a raised step or threshold. Trim any bushes or trees on the path to your home. Use bright outdoor lighting. Clear any walking paths of anything that might make someone trip, such as rocks or tools. Regularly check to see if handrails are loose or broken. Make sure that both sides of any steps have handrails. Any raised decks and porches should have guardrails on the edges. Have any leaves, snow, or ice cleared regularly. Use sand or salt on  walking paths during winter. Clean up any spills in your garage right away. This includes oil or grease spills. What can I do in the bathroom? Use night lights. Install grab bars by the toilet and in the tub and shower. Do not use towel bars as grab bars. Use non-skid mats or decals in the tub or shower. If you need to sit down in the shower, use a plastic, non-slip stool. Keep the floor dry. Clean up any water that spills on the floor as soon as it happens. Remove soap buildup in the tub or shower regularly. Attach bath mats securely with double-sided non-slip rug tape. Do not have throw rugs and other things on the floor that can make you trip. What can I do in the bedroom? Use night lights. Make sure that you have a light by your bed that is easy to reach. Do not use any sheets or blankets that are too big for your bed. They should not hang down onto the floor. Have a firm chair that has side arms. You can use this for support while you get dressed. Do not have throw rugs and other things on the floor  that can make you trip. What can I do in the kitchen? Clean up any spills right away. Avoid walking on wet floors. Keep items that you use a lot in easy-to-reach places. If you need to reach something above you, use a strong step stool that has a grab bar. Keep electrical cords out of the way. Do not use floor polish or wax that makes floors slippery. If you must use wax, use non-skid floor wax. Do not have throw rugs and other things on the floor that can make you trip. What can I do with my stairs? Do not leave any items on the stairs. Make sure that there are handrails on both sides of the stairs and use them. Fix handrails that are broken or loose. Make sure that handrails are as long as the stairways. Check any carpeting to make sure that it is firmly attached to the stairs. Fix any carpet that is loose or worn. Avoid having throw rugs at the top or bottom of the stairs. If you do  have throw rugs, attach them to the floor with carpet tape. Make sure that you have a light switch at the top of the stairs and the bottom of the stairs. If you do not have them, ask someone to add them for you. What else can I do to help prevent falls? Wear shoes that: Do not have high heels. Have rubber bottoms. Are comfortable and fit you well. Are closed at the toe. Do not wear sandals. If you use a stepladder: Make sure that it is fully opened. Do not climb a closed stepladder. Make sure that both sides of the stepladder are locked into place. Ask someone to hold it for you, if possible. Clearly mark and make sure that you can see: Any grab bars or handrails. First and last steps. Where the edge of each step is. Use tools that help you move around (mobility aids) if they are needed. These include: Canes. Walkers. Scooters. Crutches. Turn on the lights when you go into a dark area. Replace any light bulbs as soon as they burn out. Set up your furniture so you have a clear path. Avoid moving your furniture around. If any of your floors are uneven, fix them. If there are any pets around you, be aware of where they are. Review your medicines with your doctor. Some medicines can make you feel dizzy. This can increase your chance of falling. Ask your doctor what other things that you can do to help prevent falls. This information is not intended to replace advice given to you by your health care provider. Make sure you discuss any questions you have with your health care provider. Document Released: 05/07/2009 Document Revised: 12/17/2015 Document Reviewed: 08/15/2014 Elsevier Interactive Patient Education  2017 [employer].

## 2022-07-13 NOTE — Progress Notes (Signed)
Subjective:   Aaron Bradshaw is a 71 y.o. male who presents for Medicare Annual/Subsequent preventive examination.  Review of Systems     Cardiac Risk Factors include: advanced age (>49mn, >>73women);male gender;diabetes mellitus     Objective:    Today's Vitals   07/13/22 0817  BP: 120/60  Pulse: 60  Temp: 97.8 F (36.6 C)  TempSrc: Oral  SpO2: 97%  Weight: 203 lb 12.8 oz (92.4 kg)  Height: _0  (1.905 m)   Body mass index is 25.47 kg/m.     07/13/2022    8:31 AM 05/25/2022    9:26 AM 07/12/2021    8:34 AM 04/03/2020    8:23 AM 11/28/2017    9:09 AM 09/25/2015   10:23 AM 08/05/2015    9:46 AM  Advanced Directives  Does Patient Have a Medical Advance Directive? Yes Yes Yes Yes No No No  Type of AParamedicof ACamp CrookLiving will  HGrandviewLiving will HIdabelLiving will     Does patient want to make changes to medical advance directive?  No - Patient declined No - Patient declined      Copy of HXeniain Chart? No - copy requested  No - copy requested No - copy requested     Would patient like information on creating a medical advance directive?      Yes - Educational materials given     Current Medications (verified) Outpatient Encounter Medications as of 07/13/2022  Medication Sig   Ascorbic Acid (VITAMIN C) 250 MG tablet Take 250 mg by mouth daily.   b complex vitamins capsule Take 1 capsule by mouth daily.   CHOLECALCIFEROL PO Take 1 tablet by mouth daily.   co-enzyme Q-10 30 MG capsule Take 30 mg by mouth daily.   empagliflozin (JARDIANCE) 25 MG TABS tablet TAKE 1 TABLET BY MOUTH DAILY BEFORE BREAKFAST.   EPIPEN 2-PAK 0.3 MG/0.3ML SOAJ injection USE AS DIRECTED AS NEEDED   glucose blood (ONETOUCH VERIO) test strip Use as instructed once a day   imiquimod (ALDARA) 5 % cream APPLY 1 APPLICATION APPLY ON THE SKIN AS DIRECTED APPLY EVERY OTHER NIGHT FOR 4 WEEKS.   KRILL OIL  PO Take 1 capsule by mouth daily.   levothyroxine (SYNTHROID) 75 MCG tablet TAKE 1 TABLET BY MOUTH EVERY DAY   MAGNESIUM PO Take 1 tablet by mouth daily.   metFORMIN (GLUCOPHAGE-XR) 750 MG 24 hr tablet TAKE 1 TABLET BY MOUTH TWICE A DAY   neomycin-polymyxin b-dexamethasone (MAXITROL) 3.5-10000-0.1 SUSP INSTIL 1 DROP 4 TIMES DAILY IN RIGHT EYE FOR 5 DAYS THEN 2 TIMES DAILY FOR 5 DAYS   Omega-3 Fatty Acids (FISH OIL) 1000 MG CAPS Take by mouth.   OneTouch Delica Lancets 340XMISC Use as directed once a day   PRESCRIPTION MEDICATION Inject 1 each into the skin every 7 (seven) days. Patient [facility] allergy [facility] [facility] [place]   Probiotic Product (PROBIOTIC DAILY PO) Take by mouth daily.   rosuvastatin (CRESTOR) 10 MG tablet TAKE 1 TABLET BY MOUTH EVERY DAY   Semaglutide,0.25 or 0.5MG/DOS, (OZEMPIC, 0.25 OR 0.5 MG/DOSE,) 2 MG/3ML SOPN Inject 0.25 mg into the skin once a week.   Turmeric 500 MG CAPS Take 1,000 mg by mouth daily.   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   Diabetes mellitus ([place]) 6/12   A1c 7.1 %   dx 4 yrs ago   [name] syndrome    Heart murmur    'when I was younger"   History of chest pain    Hospitalized ER in [place] for CP and Palpitations with Neg Enzymes   Hyperlipidemia    Shingles    2015   Testosterone deficiency    Dr Hartley Barefoot   Thyroid disease    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   Past Surgical History:  Procedure Laterality Date   APPENDECTOMY     CHOLECYSTECTOMY  2010   COLONOSCOPY     Tics, Polyp 12/26/2003; polyps 2010, Dr Carlean Purl   KNEE ARTHROSCOPY     Left   LAPAROSCOPIC APPENDECTOMY N/A 10/05/2015   Procedure: APPENDECTOMY  LAPAROSCOPIC;  Surgeon: Judeth Horn, MD;  Location: [facility];  Service: General;  Laterality: N/A;   NOSE SURGERY     Dr  Truman Hayward   POLYPECTOMY     ROTATOR CUFF REPAIR Bilateral    left   SHOULDER ARTHROSCOPY     Family History  Problem Relation Age of Onset   Depression Father    Heart disease Father        MI in 84s   Prostate cancer Father    OCD Father    Depression Mother    Arthritis Mother    Stroke Mother 71   Parkinsonism Mother    Diabetes Mother    Depression Sister        OCD   Prostate cancer Paternal Grandfather    Diabetes Maternal Grandfather        MI in 36s   Colon cancer Neg Hx    Colon polyps Neg Hx    Esophageal cancer Neg Hx    Rectal cancer Neg Hx    Stomach cancer Neg Hx    Social History   Socioeconomic History   Marital status: Married    Spouse name: Not on file   Number of children: Not on file   Years of education: Not on file   Highest education level: Not on file  Occupational History   Not on file  Tobacco Use   Smoking status: Never   Smokeless tobacco: Never  Vaping Use   Vaping Use: Never used  Substance and Sexual Activity   Alcohol use: Yes    Alcohol/week: 1.0 - 2.0 standard drink of alcohol    Types: 1 - 2 Standard drinks or equivalent per week    Comment: beer occ stopped wine   Drug use: No   Sexual activity: Not on file  Other Topics Concern   Not on file  Social History Narrative   Not on file   Social Determinants of Health   Financial Resource Strain: Low Risk  (07/13/2022)   Overall Financial Resource Strain (CARDIA)    Difficulty of Paying Living Expenses: Not hard at all  Food Insecurity: No Food Insecurity (07/13/2022)   Hunger Vital Sign    Worried About Running Out of Food in the Last Year: Never true    Ran Out of Food in the Last Year: Never true  Transportation Needs: No Transportation Needs (07/13/2022)   PRAPARE - Hydrologist (Medical):  No    Lack of Transportation  (Non-Medical): No  Physical Activity: Sufficiently Active (07/13/2022)   Exercise Vital Sign    Days of Exercise per Week: 5 days    Minutes of Exercise per Session: 120 min  Stress: No Stress Concern Present (07/13/2022)   [place]    Feeling of Stress : Not at all  Social Connections: [place] (07/13/2022)   Social Connection and Isolation Panel [NHANES]    Frequency of Communication with Friends and Family: More than three times a week    Frequency of Social Gatherings with Friends and Family: More than three times a week    Attends Religious Services: More than 4 times per year    Active Member of [employer] or Organizations: Yes    Attends Music therapist: More than 4 times per year    Marital Status: Married    Tobacco Counseling Counseling given: Not Answered   Clinical Intake:  Pre-visit preparation completed: No  Pain : No/denies pain   Nutrition Risk Assessment:  Has the patient had any N/V/D within the last 2 months?  No  Does the patient have any non-healing wounds?  No  Has the patient had any unintentional weight loss or weight gain?  No   Diabetes:  Is the patient diabetic?  Yes  If diabetic, was a CBG obtained today?  No  Did the patient bring in their glucometer from home?  No  How often do you monitor your CBG's? No.   Financial Strains and Diabetes Management:  Are you having any financial strains with the device, your supplies or your medication? Yes .  Does the patient want to be seen by Chronic Care Management for management of their diabetes?  No  Would the patient like to be referred to a Nutritionist or for Diabetic Management?  No   Diabetic Exams:  Diabetic Eye Exam: Completed No. Overdue for diabetic eye exam. Pt has been advised about the importance in completing this exam. A referral has been placed today. Message sent to referral coordinator for  scheduling purposes. Advised pt to expect a call from office referred to regarding appt.  Diabetic Foot Exam: Completed No. Pt has been advised about the importance in completing this exam. Pt is scheduled for diabetic foot exam on Followed by PCP.    BMI - recorded: 25.47 Nutritional Status: BMI 25 -29 Overweight Nutritional Risks: None Diabetes: Yes CBG done?: No Did pt. bring in CBG monitor from home?: No  How often do you need to have someone help you when you read instructions, pamphlets, or other written materials from your doctor or pharmacy?: 1 - Never  Diabetic? Yes  Interpreter Needed?: No  Information entered by :: Rolene Arbour LPN   Activities of Daily Living    07/13/2022    8:30 AM  In your present state of health, do you have any difficulty performing the following activities:  Hearing? 0  Vision? 0  Difficulty concentrating or making decisions? 0  Walking or climbing stairs? 0  Dressing or bathing? 0  Doing errands, shopping? 0  Preparing Food and eating ? N  Using the Toilet? N  In the past six months, have you accidently leaked urine? N  Do you have problems with loss of bowel control? N  Managing your Medications? N  Managing your Finances? N  Housekeeping or managing your Housekeeping? N    Patient Care Team: Eulas Post,  MD as PCP - General (Family Medicine)  Indicate any recent Medical Services you may have received from other than [facility] providers in the past year (date may be approximate).     Assessment:   This is a routine wellness examination for [place].  Hearing/Vision screen Hearing Screening - Comments:: Denies hearing difficulties   Vision Screening - Comments:: Wears rx glasses - up to date with routine eye exams with  Dr Delman Cheadle  Dietary issues and exercise activities discussed: Current Exercise Habits: Home exercise routine, Type of exercise: walking, Time (Minutes): > 60, Frequency (Times/Week): 3, Weekly Exercise  (Minutes/Week): 0, Intensity: Moderate, Exercise limited by: None identified   Goals Addressed               This Visit's Progress     Stay Healthy (pt-stated)         Depression Screen    07/13/2022    8:29 AM 05/25/2022    9:26 AM 09/14/2021    3:37 PM 07/12/2021    8:28 AM 06/04/2021    8:22 AM 04/03/2020    8:25 AM 12/09/2019    8:42 AM  PHQ 2/9 Scores  PHQ - 2 Score 0 0 0 0 0 0 0  PHQ- 9 Score   1   0     Fall Risk    07/13/2022    8:31 AM 05/25/2022    9:25 AM 07/12/2021    8:30 AM 06/04/2021    8:23 AM 04/03/2020    8:24 AM  Fall Risk   Falls in the past year? 0 0 0 0 0  Number falls in past yr: 0  0  0  Injury with Fall? 0  0  0  Risk for fall due to : No Fall Risks    Medication side effect  Follow up Falls prevention discussed    Falls evaluation completed;Falls prevention discussed    FALL RISK PREVENTION PERTAINING TO THE HOME:  Any stairs in or around the home? Yes If so, are there any without handrails? No  Home free of loose throw rugs in walkways, pet beds, electrical cords, etc? Yes  Adequate lighting in your home to reduce risk of falls? Yes   ASSISTIVE DEVICES UTILIZED TO PREVENT FALLS:  Life alert? No  Use of a cane, walker or w/c? No  Grab bars in the bathroom? No  Shower chair or bench in shower? No  Elevated toilet seat or a handicapped toilet? No   TIMED UP AND GO:  Was the test performed? Yes .  Length of time to ambulate 10 feet: 10 sec.   Gait steady and fast without use of assistive device  Cognitive Function:        07/13/2022    8:31 AM 07/12/2021    8:32 AM  6CIT Screen  What Year? 0 points 0 points  What month? 0 points 0 points  What time? 0 points 0 points  Count back from 20 0 points 0 points  Months in reverse 0 points 0 points  Repeat phrase 0 points 0 points  Total Score 0 points 0 points    Immunizations Immunization History  Administered Date(s) Administered   Fluad Quad(high Dose 65+) 05/14/2019,  06/04/2020, 05/18/2022   Influenza Whole 05/25/2012   Influenza, High Dose Seasonal PF 10/06/2016, 04/12/2017, 10/12/2017, 11/12/2019, 05/05/2021, 07/29/2021   Influenza,inj,Quad PF,6+ Mos 04/17/2013, 07/02/2015, 04/18/2016, 04/13/2018   PFIZER(Purple Top)SARS-COV-2 Vaccination 09/01/2019, 09/25/2019, 11/12/2019   Pneumococcal Conjugate-13 01/03/2017, 01/10/2018   Pneumococcal Polysaccharide-23  05/16/2013, 10/06/2016, 10/12/2017, 11/12/2018, 11/12/2019, 07/29/2021   Tdap 01/03/2017   Zoster Recombinat (Shingrix) 08/18/2021, 02/18/2022   Zoster, Live 05/14/2014    TDAP status: Up to date  Flu Vaccine status: Up to date  Pneumococcal vaccine status: Up to date  Covid-19 vaccine status: Completed vaccines  Qualifies for Shingles Vaccine? Yes   Zostavax completed Yes   Shingrix Completed?: Yes  Screening Tests Health Maintenance  Topic Date Due   COVID-19 Vaccine (4 - 2023-24 season) 07/29/2022 (Originally 03/25/2022)   HEMOGLOBIN A1C  11/17/2022   Diabetic kidney evaluation - eGFR measurement  12/17/2022   Diabetic kidney evaluation - Urine ACR  12/17/2022   OPHTHALMOLOGY EXAM  04/19/2023   FOOT EXAM  05/19/2023   Medicare Annual Wellness (AWV)  07/14/2023   COLONOSCOPY (Pts 45-74yr Insurance coverage will need to be confirmed)  01/22/2024   DTaP/Tdap/Td (2 - Td or Tdap) 01/04/2027   Pneumonia Vaccine 70 Years old  Completed   INFLUENZA VACCINE  Completed   Hepatitis C Screening  Completed   Zoster Vaccines- Shingrix  Completed   HPV VACCINES  Aged Out    Health Maintenance  There are no preventive care reminders to display for this patient.   Colorectal cancer screening: Type of screening: Colonoscopy. Completed 01/22/19. Repeat every 5 years  Lung Cancer Screening: (Low Dose CT Chest recommended if Age 70-80years, 30 pack-year currently smoking OR have quit w/in 15years.) does not qualify.     Additional Screening:  Hepatitis C Screening: does qualify; Completed  12/02/15  Vision Screening: Recommended annual ophthalmology exams for early detection of glaucoma and other disorders of the eye. Is the patient up to date with their annual eye exam?  Yes  Who is the provider or what is the name of the office in which the patient attends annual eye exams? Dr GDelman CheadleIf pt is not established with a provider, would they like to be referred to a provider to establish care? No .   Dental Screening: Recommended annual dental exams for proper oral hygiene  Community Resource Referral / Chronic Care Management:  CRR required this visit?  No   CCM required this visit?  No      Plan:     I have personally reviewed and noted the following in the patient's chart:   Medical and social history Use of alcohol, tobacco or illicit drugs  Current medications and supplements including opioid prescriptions. Patient is not currently taking opioid prescriptions. Functional ability and status Nutritional status Physical activity Advanced directives List of other physicians Hospitalizations, surgeries, and ER visits in previous 12 months Vitals Screenings to include cognitive, depression, and falls Referrals and appointments  In addition, I have reviewed and discussed with patient certain preventive protocols, quality metrics, and best practice recommendations. A written personalized care plan for preventive services as well as general preventive health recommendations were provided to patient.     BCriselda Peaches LPN   [id number]  Nurse Notes: None

## 2022-07-20 DIAGNOSIS — J3081 Allergic rhinitis due to animal (cat) (dog) hair and dander: Secondary | ICD-10-CM | POA: Diagnosis not present

## 2022-07-20 DIAGNOSIS — J3089 Other allergic rhinitis: Secondary | ICD-10-CM | POA: Diagnosis not present

## 2022-07-20 DIAGNOSIS — J301 Allergic rhinitis due to pollen: Secondary | ICD-10-CM | POA: Diagnosis not present

## 2022-07-26 DIAGNOSIS — Z125 Encounter for screening for malignant neoplasm of prostate: Secondary | ICD-10-CM | POA: Diagnosis not present

## 2022-07-27 DIAGNOSIS — J3089 Other allergic rhinitis: Secondary | ICD-10-CM | POA: Diagnosis not present

## 2022-07-27 DIAGNOSIS — J3081 Allergic rhinitis due to animal (cat) (dog) hair and dander: Secondary | ICD-10-CM | POA: Diagnosis not present

## 2022-07-27 DIAGNOSIS — J301 Allergic rhinitis due to pollen: Secondary | ICD-10-CM | POA: Diagnosis not present

## 2022-07-29 DIAGNOSIS — J3081 Allergic rhinitis due to animal (cat) (dog) hair and dander: Secondary | ICD-10-CM | POA: Diagnosis not present

## 2022-07-29 DIAGNOSIS — H1045 Other chronic allergic conjunctivitis: Secondary | ICD-10-CM | POA: Diagnosis not present

## 2022-07-29 DIAGNOSIS — J3089 Other allergic rhinitis: Secondary | ICD-10-CM | POA: Diagnosis not present

## 2022-07-29 DIAGNOSIS — J301 Allergic rhinitis due to pollen: Secondary | ICD-10-CM | POA: Diagnosis not present

## 2022-08-01 DIAGNOSIS — R972 Elevated prostate specific antigen [PSA]: Secondary | ICD-10-CM | POA: Diagnosis not present

## 2022-08-02 DIAGNOSIS — J3089 Other allergic rhinitis: Secondary | ICD-10-CM | POA: Diagnosis not present

## 2022-08-02 DIAGNOSIS — J3081 Allergic rhinitis due to animal (cat) (dog) hair and dander: Secondary | ICD-10-CM | POA: Diagnosis not present

## 2022-08-02 DIAGNOSIS — J301 Allergic rhinitis due to pollen: Secondary | ICD-10-CM | POA: Diagnosis not present

## 2022-08-06 ENCOUNTER — Other Ambulatory Visit: Payer: Self-pay | Admitting: Family Medicine

## 2022-08-07 ENCOUNTER — Other Ambulatory Visit: Payer: Self-pay | Admitting: Family Medicine

## 2022-08-09 DIAGNOSIS — Z85828 Personal history of other malignant neoplasm of skin: Secondary | ICD-10-CM | POA: Diagnosis not present

## 2022-08-09 DIAGNOSIS — D0461 Carcinoma in situ of skin of right upper limb, including shoulder: Secondary | ICD-10-CM | POA: Diagnosis not present

## 2022-08-09 DIAGNOSIS — D485 Neoplasm of uncertain behavior of skin: Secondary | ICD-10-CM | POA: Diagnosis not present

## 2022-08-09 DIAGNOSIS — L570 Actinic keratosis: Secondary | ICD-10-CM | POA: Diagnosis not present

## 2022-08-09 DIAGNOSIS — L821 Other seborrheic keratosis: Secondary | ICD-10-CM | POA: Diagnosis not present

## 2022-08-09 DIAGNOSIS — D1801 Hemangioma of skin and subcutaneous tissue: Secondary | ICD-10-CM | POA: Diagnosis not present

## 2022-08-15 DIAGNOSIS — G8929 Other chronic pain: Secondary | ICD-10-CM | POA: Diagnosis not present

## 2022-08-15 DIAGNOSIS — E559 Vitamin D deficiency, unspecified: Secondary | ICD-10-CM | POA: Diagnosis not present

## 2022-08-15 DIAGNOSIS — E039 Hypothyroidism, unspecified: Secondary | ICD-10-CM | POA: Diagnosis not present

## 2022-08-15 DIAGNOSIS — E1165 Type 2 diabetes mellitus with hyperglycemia: Secondary | ICD-10-CM | POA: Diagnosis not present

## 2022-08-15 DIAGNOSIS — E663 Overweight: Secondary | ICD-10-CM | POA: Diagnosis not present

## 2022-08-15 DIAGNOSIS — K5900 Constipation, unspecified: Secondary | ICD-10-CM | POA: Diagnosis not present

## 2022-08-15 DIAGNOSIS — E1169 Type 2 diabetes mellitus with other specified complication: Secondary | ICD-10-CM | POA: Diagnosis not present

## 2022-08-15 DIAGNOSIS — E785 Hyperlipidemia, unspecified: Secondary | ICD-10-CM | POA: Diagnosis not present

## 2022-08-17 ENCOUNTER — Ambulatory Visit (INDEPENDENT_AMBULATORY_CARE_PROVIDER_SITE_OTHER): Payer: PPO | Admitting: Family Medicine

## 2022-08-17 ENCOUNTER — Encounter: Payer: Self-pay | Admitting: Family Medicine

## 2022-08-17 VITALS — BP 130/80 | HR 56 | Temp 98.0°F | Ht 75.0 in | Wt 205.0 lb

## 2022-08-17 DIAGNOSIS — E785 Hyperlipidemia, unspecified: Secondary | ICD-10-CM

## 2022-08-17 DIAGNOSIS — E119 Type 2 diabetes mellitus without complications: Secondary | ICD-10-CM

## 2022-08-17 DIAGNOSIS — E039 Hypothyroidism, unspecified: Secondary | ICD-10-CM | POA: Diagnosis not present

## 2022-08-17 LAB — POCT GLYCOSYLATED HEMOGLOBIN (HGB A1C): Hemoglobin A1C: 8.6 % — AB (ref 4.0–5.6)

## 2022-08-17 MED ORDER — DEXCOM G6 RECEIVER DEVI: 1 refills | Status: DC

## 2022-08-17 MED ORDER — DEXCOM G6 TRANSMITTER MISC: 1 refills | Status: DC

## 2022-08-17 MED ORDER — DEXCOM G6 SENSOR MISC: 1 refills | Status: DC

## 2022-08-17 NOTE — Patient Instructions (Signed)
We will send in Dexcom continuous glucose monitor.  Start the [place] and start at 0.25 mg [place] once weekly and after one month if tolerating well increase to 0.5 mg [place] once weekly.

## 2022-08-17 NOTE — Progress Notes (Signed)
Established Patient Office Visit  Subjective   Patient ID: Aaron Bradshaw, male    DOB: 11-24-1950  Age: 72 y.o. MRN: [id number]  Chief Complaint  Patient presents with   Follow-up    HPI   Aaron Bradshaw is seen for medical follow-up.  Longstanding history of type 2 diabetes.  History of poor control recently.  He still exercise regularly but knows he has poor compliance with diet at times especially portion control.  Currently on Jardiance 25 mg daily and metformin extended release 750 mg twice daily.  Previously was on glimepiride but had some hypoglycemic issues.  Last visit we had prescribed Ozempic but he never filled this because of cost.  He now has a new Buyer, retail.  He would like to consider possible continuous glucose monitoring system.  He has hyperlipidemia treated with Crestor 10 mg daily and tolerating well.  Hypothyroidism she with levothyroxine 75 mcg daily. Last thyroid check was last May  He has recently had elevated PSA and saw urology and has follow-up there in a month to repeat PSA and possible biopsy.  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 He has never used smokeless tobacco. He reports current alcohol use of about 1.0 - 2.0 standard drink of alcohol per week. He reports that he does not use drugs. family history includes Arthritis in his mother; Depression in his father, mother, and sister; Diabetes in his maternal grandfather and mother; Heart disease in his father; OCD in his father; Parkinsonism in his mother; Prostate cancer in his father and paternal grandfather; Stroke (age of onset: 80) in his mother. Allergies  Allergen Reactions   Codeine Nausea Only   Prozac [Fluoxetine Hcl] Anxiety    Increased anxiety    Review of Systems  Constitutional:  Negative for chills, fever and malaise/fatigue.  Eyes:  Negative for blurred vision.  Respiratory:  Negative for shortness of breath.   Cardiovascular:  Negative for chest pain.  Gastrointestinal:  Negative for abdominal pain.  Neurological:  Negative for dizziness, weakness and headaches.      Objective:     BP 130/80 (BP Location: Left Arm, Patient Position: Sitting, Cuff Size: Normal)  Pulse (!) 56   Temp 98 F (36.7 C) (Oral)   Ht '6\' 3"'$  (1.905 m)   Wt 205 lb (93 kg)   SpO2 98%   BMI 25.62 kg/m    Physical Exam Constitutional:      Appearance: He is well-developed.  Eyes:     Pupils: Pupils are equal, round, and reactive to light.  Neck:     Thyroid: No thyromegaly.  Cardiovascular:     Rate and Rhythm: Normal rate and regular rhythm.  Pulmonary:     Effort: Pulmonary effort is normal. No respiratory distress.     Breath sounds: Normal breath sounds. No wheezing or rales.  Musculoskeletal:     Cervical back: Neck supple.  Neurological:     Mental Status: He is alert and oriented to person, place, and time.      Results for orders placed or performed in visit on 08/17/22  POCT glycosylated hemoglobin (Hb A1C)   Result Value Ref Range   Hemoglobin A1C 8.6 (A) 4.0 - 5.6 %   HbA1c POC (<> result, manual entry)     HbA1c, POC (prediabetic range)     HbA1c, POC (controlled diabetic range)        The 10-year ASCVD risk score (Arnett DK, et al., 2019) is: 29.8%    Assessment & Plan:   #1 poorly controlled type 2 diabetes.  A1c today 8.6%.  Never started Ozempic which was previously prescribed.  He is willing to consider starting this again now.  He had previous hypoglycemia with sulfonylurea medication.  He remains on Jardiance and metformin.  We mention other option would be long-acting insulin but he would like to try to see if he can afford to get GLP-1 first  -We also discussed continuous glucose monitoring and he would like to try Dexcom if this is covered.  This will be sent in. -Set up 62-monthfollow-up  #2 hyperlipidemia treated with Crestor.  Recheck lipids at 344-monthollow-up  #3 hypothyroidism.  On replacement with levothyroxine 75 mcg daily.  Good compliance with medication.  Recheck TSH in 3 months Return in about 3 months (around 11/16/2022).    BrCarolann LittlerMD

## 2022-08-19 ENCOUNTER — Other Ambulatory Visit: Payer: Self-pay

## 2022-08-19 MED ORDER — ONETOUCH VERIO VI STRP: ORAL_STRIP | 3 refills | Status: AC

## 2022-08-22 ENCOUNTER — Ambulatory Visit: Payer: PPO | Admitting: Dietician

## 2022-08-24 ENCOUNTER — Encounter: Payer: Self-pay | Admitting: Dietician

## 2022-08-24 ENCOUNTER — Encounter: Payer: PPO | Attending: Family Medicine | Admitting: Dietician

## 2022-08-24 VITALS — Ht 75.0 in | Wt 205.8 lb

## 2022-08-24 DIAGNOSIS — E1165 Type 2 diabetes mellitus with hyperglycemia: Secondary | ICD-10-CM

## 2022-08-24 NOTE — Patient Instructions (Addendum)
Aim for 150 minutes of physical activity weekly.   Eat more Non-Starchy Vegetables. Make 1/2 of your plate non-starchy at least twice per day.   Rethink what you drink. Choose beverages without added sugar. Look for 0 carbs on the label.    When snacking, make sure to choose a carb and protein.    If having cereal and fruit OR oatmeal and fruit, aim to include a protein!   Ozempic video: [email or web address]

## 2022-08-24 NOTE — Progress Notes (Signed)
Diabetes Self-Management Education  Visit Type: Follow-up  Appt. Start Time: 1145 Appt. End Time: 1210  08/24/2022  [name], identified by name and date of birth, is a 72 y.o. male with a diagnosis of Diabetes: Type 2.   ASSESSMENT  History includes: type 2 diabetes, allergies, anxiety, cancer, heart murmur, HLD, thyroid disease.  Labs noted: 08/17/22 A1c 8.6% Medications include: metformin, jardiance, ozempic (started last week) Supplements: probiotic, co-Q10, krill oil, turmeric, magnesium, B complex, omega 3's, MVI, vitamin C, vitamin D.    Pt reports he had to get a new blood glucose meter because his last one was reading inaccurately. Pt reports his fasting blood glucose has been 135-'150mg'$ /dL.  Pt states he is concerned because his A1c went up a little.    Pt swims 3x/wk and goes to gym daily.   Pt reports since last visit he has been drinking mostly water and sparkling water, and he has increased his vegetable intake.   Height '6\' 3"'$  (1.905 m), weight 205 lb 12.8 oz (93.4 kg). Body mass index is 25.72 kg/m.   Diabetes Self-Management Education - 08/24/22 1143       Visit Information   Visit Type Follow-up      Initial Visit   Diabetes Type Type 2    Are you currently following a meal plan? No    Are you taking your medications as prescribed? Yes      Health Coping   How would you rate your overall health? Good      Psychosocial Assessment   Patient Belief/Attitude about Diabetes Motivated to manage diabetes    What is the hardest part about your diabetes right now, causing you the most concern, or is the most worrisome to you about your diabetes?   Making healty food and beverage choices    Self-care barriers None    Self-management support Doctor's office    Other persons present Patient    Patient Concerns Healthy Lifestyle    Special Needs None    Preferred Learning Style No preference indicated    Learning Readiness Ready    How often do you  need to have someone help you when you read instructions, pamphlets, or other written materials from your doctor or pharmacy? 1 - Never    What is the last grade level you completed in school? 12th      Pre-Education Assessment   Patient understands the diabetes disease and treatment process. Needs Review    Patient understands incorporating nutritional management into lifestyle. Needs Review    Patient undertands incorporating physical activity into lifestyle. Needs Review    Patient understands using medications safely. Needs Review    Patient understands monitoring blood glucose, interpreting and using results Needs Review    Patient understands prevention, detection, and treatment of acute complications. Needs Review    Patient understands prevention, detection, and treatment of chronic complications. Needs Review    Patient understands how to develop strategies to address psychosocial issues. Needs Review    Patient understands how to develop strategies to promote health/change behavior. Needs Review      Complications   Last HgB A1C per patient/outside source 8.6 %    How often do you check your blood sugar? 1-2 times/day    Fasting Blood glucose range (mg/dL) 130-179      Dietary Intake   Breakfast oatmeal and apple and toast with peanut butter    Snack (morning) protein bar and apple    Lunch sandwich on  whole wheat with [place] and cheese and sometimes carrots    Snack (afternoon) apple    Dinner chicken and salad and vegetables    Beverage(s) water, milk, coffee      Activity / Exercise   Activity / Exercise Type Moderate (swimming / aerobic walking)    How many days per week do you exercise? 7    How many minutes per day do you exercise? 60    Total minutes per week of exercise 420      Patient Education   Previous Diabetes Education Yes (please comment)    Disease Pathophysiology Explored patient's options for treatment of their diabetes    Healthy Eating Role of diet  in the treatment of diabetes and the relationship between the three main macronutrients and blood glucose level;Plate Method;Reviewed blood glucose goals for pre and post meals and how to evaluate the patients' food intake on their blood glucose level.;Information on hints to eating out and maintain blood glucose control.;Meal timing in regards to the patients' current diabetes medication.    Being Active Role of exercise on diabetes management, blood pressure control and cardiac health.    Medications Taught/reviewed insulin/injectables, injection, site rotation, insulin/injectables storage and needle disposal.;Reviewed patients medication for diabetes, action, purpose, timing of dose and side effects.    Monitoring Identified appropriate SMBG and/or A1C goals.    Chronic complications Relationship between chronic complications and blood glucose control    Diabetes Stress and Support Identified and addressed patients feelings and concerns about diabetes    Lifestyle and Health Coping Lifestyle issues that need to be addressed for better diabetes care      Individualized Goals (developed by patient)   Nutrition General guidelines for healthy choices and portions discussed    Physical Activity Exercise 3-5 times per week;60 minutes per day    Medications take my medication as prescribed    Monitoring  Test my blood glucose as discussed    Problem Solving Eating Pattern    Reducing Risk examine blood glucose patterns;do foot checks daily;treat hypoglycemia with 15 grams of carbs if blood glucose less than '70mg'$ /dL    Health Coping Ask for help with psychological, social, or emotional issues      Patient Self-Evaluation of Goals - Patient rates self as meeting previously set goals (% of time)   Nutrition 25 - 50% (sometimes)    Physical Activity >75% (most of the time)    Medications >75% (most of the time)    Monitoring 50 - 75 % (half of the time)    Problem Solving and behavior change strategies   50 - 75 % (half of the time)    Reducing Risk (treating acute and chronic complications) 50 - 75 % (half of the time)    Health Coping 50 - 75 % (half of the time)      Post-Education Assessment   Patient understands the diabetes disease and treatment process. Comprehends key points    Patient understands incorporating nutritional management into lifestyle. Comprehends key points    Patient undertands incorporating physical activity into lifestyle. Demonstrates understanding / competency    Patient understands using medications safely. Demonstrates understanding / competency    Patient understands monitoring blood glucose, interpreting and using results Demonstrates understanding / competency    Patient understands prevention, detection, and treatment of acute complications. Comprehends key points    Patient understands prevention, detection, and treatment of chronic complications. Comprehends key points    Patient understands how to develop  strategies to address psychosocial issues. Comprehends key points    Patient understands how to develop strategies to promote health/change behavior. Comprehends key points      Outcomes   Expected Outcomes Demonstrated interest in learning. Expect positive outcomes    Future DMSE 3-4 months    Program Status Not Completed      Subsequent Visit   Since your last visit have you continued or begun to take your medications as prescribed? Yes    Since your last visit, are you checking your blood glucose at least once a day? Yes             Individualized Plan for Diabetes Self-Management Training:   Learning Objective:  Patient will have a greater understanding of diabetes self-management. Patient education plan is to attend individual and/or group sessions per assessed needs and concerns.   Plan:   Patient Instructions  Aim for 150 minutes of physical activity weekly.   Eat more Non-Starchy Vegetables. Make 1/2 of your plate non-starchy at  least twice per day.   Rethink what you drink. Choose beverages without added sugar. Look for 0 carbs on the label.    When snacking, make sure to choose a carb and protein.    If having cereal and fruit OR oatmeal and fruit, aim to include a protein!   Ozempic video: [email or web address]  Expected Outcomes:  Demonstrated interest in learning. Expect positive outcomes  Education material provided: No handouts provided on this follow-up.  If problems or questions, patient to contact team via:  Phone  Future DSME appointment: 3-4 months

## 2022-09-05 DIAGNOSIS — R972 Elevated prostate specific antigen [PSA]: Secondary | ICD-10-CM | POA: Diagnosis not present

## 2022-09-07 ENCOUNTER — Other Ambulatory Visit: Payer: Self-pay | Admitting: Family Medicine

## 2022-09-08 DIAGNOSIS — J3081 Allergic rhinitis due to animal (cat) (dog) hair and dander: Secondary | ICD-10-CM | POA: Diagnosis not present

## 2022-09-08 DIAGNOSIS — J3089 Other allergic rhinitis: Secondary | ICD-10-CM | POA: Diagnosis not present

## 2022-09-08 DIAGNOSIS — J301 Allergic rhinitis due to pollen: Secondary | ICD-10-CM | POA: Diagnosis not present

## 2022-10-04 DIAGNOSIS — J301 Allergic rhinitis due to pollen: Secondary | ICD-10-CM | POA: Diagnosis not present

## 2022-10-04 DIAGNOSIS — J3089 Other allergic rhinitis: Secondary | ICD-10-CM | POA: Diagnosis not present

## 2022-10-04 DIAGNOSIS — J3081 Allergic rhinitis due to animal (cat) (dog) hair and dander: Secondary | ICD-10-CM | POA: Diagnosis not present

## 2022-10-12 ENCOUNTER — Other Ambulatory Visit: Payer: Self-pay

## 2022-10-12 MED ORDER — FREESTYLE LIBRE 3 READER DEVI: 1.0000 | Freq: Every day | 1 refills | Status: DC

## 2022-10-12 MED ORDER — FREESTYLE LIBRE 3 SENSOR MISC: 1 refills | Status: DC

## 2022-10-18 ENCOUNTER — Telehealth: Payer: Self-pay | Admitting: Family Medicine

## 2022-10-18 NOTE — Telephone Encounter (Signed)
PA initiated and has been sent to plan. KEY: [id number]

## 2022-10-18 NOTE — Telephone Encounter (Signed)
Pt states that his ozempic prescription is not being covered by his insurance according to [facility]. However, he said last time he had it filled it was covered. He said [facility] was supposed to reach out to the office on Monday, however he has not heard anything as of today.

## 2022-10-21 ENCOUNTER — Telehealth: Payer: Self-pay

## 2022-10-21 NOTE — Telephone Encounter (Signed)
Patient Advocate Encounter  Prior Authorization for Ozempic 0.25mg  has been approved.    Effective dates: 10/18/22 through 10/18/23

## 2022-10-24 NOTE — Telephone Encounter (Signed)
Patient informed of approval on 10/20/2022

## 2022-11-16 ENCOUNTER — Encounter: Payer: Self-pay | Admitting: Family Medicine

## 2022-11-16 ENCOUNTER — Ambulatory Visit (INDEPENDENT_AMBULATORY_CARE_PROVIDER_SITE_OTHER): Payer: PPO | Admitting: Family Medicine

## 2022-11-16 ENCOUNTER — Ambulatory Visit: Payer: PPO | Admitting: Family Medicine

## 2022-11-16 VITALS — BP 130/80 | HR 60 | Temp 97.0°F | Wt 203.0 lb

## 2022-11-16 DIAGNOSIS — E119 Type 2 diabetes mellitus without complications: Secondary | ICD-10-CM | POA: Diagnosis not present

## 2022-11-16 DIAGNOSIS — Z7985 Long-term (current) use of injectable non-insulin antidiabetic drugs: Secondary | ICD-10-CM | POA: Diagnosis not present

## 2022-11-16 LAB — POCT GLYCOSYLATED HEMOGLOBIN (HGB A1C): Hemoglobin A1C: 7.2 % — AB (ref 4.0–5.6)

## 2022-11-16 LAB — GLUCOSE, POCT (MANUAL RESULT ENTRY): POC Glucose: 255 mg/dl — AB (ref 70–99)

## 2022-11-16 MED ORDER — OZEMPIC (0.25 OR 0.5 MG/DOSE) 2 MG/3ML ~~LOC~~ SOPN: 0.5000 mg | PEN_INJECTOR | SUBCUTANEOUS | 2 refills | Status: DC

## 2022-11-16 NOTE — Progress Notes (Signed)
   Subjective:    Patient ID: Aaron Bradshaw, male    DOB: 04/12/1951, 72 y.o.   MRN: [id number]  HPI Here to follow up on diabetes. He feels well. He exercises every day and watches his diet. When he was here 3 months ago his A1c was 8.6%. At that time he was taking Metformin ER 750 mg BID and Jardiance 25 mg daily. Now for the past 6 weeks he has been also taking 0.25 mg of Ozempic weekly. Today his A1c is 7.2%.    Review of Systems  Constitutional: Negative.   Respiratory: Negative.    Cardiovascular: Negative.        Objective:   Physical Exam Constitutional:      Appearance: Normal appearance.  Cardiovascular:     Rate and Rhythm: Normal rate and regular rhythm.     Pulses: Normal pulses.     Heart sounds: Normal heart sounds.  Pulmonary:     Effort: Pulmonary effort is normal.     Breath sounds: Normal breath sounds.  Neurological:     Mental Status: He is alert.           Assessment & Plan:  Type 2 diabetes. We will increase the Ozempic to 0.5 mg weekly. Follow up with Dr. Caryl Never in 90 days.  Gershon Crane, MD

## 2022-11-16 NOTE — Addendum Note (Signed)
Addended by: Carola Rhine on: 11/16/2022 09:36 AM   Modules accepted: Orders

## 2022-11-23 ENCOUNTER — Encounter: Payer: Self-pay | Admitting: Dietician

## 2022-11-23 ENCOUNTER — Encounter: Payer: PPO | Attending: Family Medicine | Admitting: Dietician

## 2022-11-23 VITALS — Wt 203.0 lb

## 2022-11-23 DIAGNOSIS — J3081 Allergic rhinitis due to animal (cat) (dog) hair and dander: Secondary | ICD-10-CM | POA: Diagnosis not present

## 2022-11-23 DIAGNOSIS — E119 Type 2 diabetes mellitus without complications: Secondary | ICD-10-CM | POA: Diagnosis not present

## 2022-11-23 DIAGNOSIS — J3089 Other allergic rhinitis: Secondary | ICD-10-CM | POA: Diagnosis not present

## 2022-11-23 DIAGNOSIS — J301 Allergic rhinitis due to pollen: Secondary | ICD-10-CM | POA: Diagnosis not present

## 2022-11-23 NOTE — Patient Instructions (Signed)
  Previous Goals: Continue to aim for at least 150 minutes of physical activity weekly.   Eat more Non-Starchy Vegetables. Make 1/2 of your plate non-starchy at least twice per day.  If having cereal and fruit OR oatmeal and fruit, aim to include a protein!  New Goal: Goal: aim to have a snack between both meals (carb + protein) Examples: protein shake and fruit, cheese and crackers, peanut butter toast, peanut butter crackers, apple and peanut butter.

## 2022-11-23 NOTE — Progress Notes (Signed)
Diabetes Self-Management Education  Visit Type: Follow-up  Appt. Start Time: 0910 Appt. End Time: 0930  11/23/2022  [name], identified by name and date of birth, is a 72 y.o. male with a diagnosis of Diabetes: Type 2  .   ASSESSMENT  History includes: type 2 diabetes, allergies, anxiety, cancer, heart murmur, HLD, thyroid disease.  Labs noted: A1c 7.2% on 11/16/22 Medications include: metformin, jardiance, ozempic  Supplements: probiotic, co-Q10, krill oil, turmeric, magnesium, B complex, omega 3's, MVI, vitamin C, vitamin D.    Pt A1c down today from 8.6% in 07/2022 to 7.2% on 11/16/2022.   Pt states he has been taking ozempic since previous visit. He reports he now has a freestyle libre CGM but he does not have the meter with him today and does not have the app on his phone.  Pt states he would like weight regain to 210 lbs and to remain there consistently because he feels that he has lost some muscle mass in his upper body.   Pt states the ozempic has not reduced his appetite, but he does not want it to.   Pt states sometimes his blood glucose spikes to 200 after meals. He also reports it is usually 150 in the morning fasting, but recently was 179. Pt states he eats ice cream before bed sometimes.   Weight 203 lb (92.1 kg). Body mass index is 25.37 kg/m.   Diabetes Self-Management Education - 11/23/22 0904       Visit Information   Visit Type Follow-up      Health Coping   How would you rate your overall health? Good      Psychosocial Assessment   Patient Belief/Attitude about Diabetes Motivated to manage diabetes    What is the hardest part about your diabetes right now, causing you the most concern, or is the most worrisome to you about your diabetes?   Making healty food and beverage choices    Self-care barriers None    Self-management support Doctor's office    Other persons present Patient    Patient Concerns Nutrition/Meal planning    Special  Needs None    Preferred Learning Style No preference indicated    Learning Readiness Ready      Pre-Education Assessment   Patient understands the diabetes disease and treatment process. Needs Review    Patient understands incorporating nutritional management into lifestyle. Needs Review    Patient undertands incorporating physical activity into lifestyle. Needs Review    Patient understands using medications safely. Needs Review    Patient understands monitoring blood glucose, interpreting and using results Needs Review    Patient understands prevention, detection, and treatment of acute complications. Needs Review    Patient understands prevention, detection, and treatment of chronic complications. Needs Review    Patient understands how to develop strategies to address psychosocial issues. Needs Review    Patient understands how to develop strategies to promote health/change behavior. Needs Review      Complications   Last HgB A1C per patient/outside source 7.2 %    How often do you check your blood sugar? > 4 times/day    Fasting Blood glucose range (mg/dL) 161-096    Postprandial Blood glucose range (mg/dL) 045-409      Dietary Intake   Breakfast grain berry cereal with berries and 1 peice of toast with peanut butter and honey    Snack (morning) premier protein shake OR apple    Lunch sandwich with [place] and cheese on  [employer] and milk or water    Snack (afternoon) toast with peanut butter    Dinner chicken with roasted veggies    Snack (evening) ice cream OR fruit    Beverage(s) water, coffee with half and half      Activity / Exercise   Activity / Exercise Type Moderate (swimming / aerobic walking)    How many days per week do you exercise? 5    How many minutes per day do you exercise? 60    Total minutes per week of exercise 300      Patient Education   Previous Diabetes Education Yes (please comment)    Disease Pathophysiology Explored patient's options for treatment of  their diabetes    Healthy Eating Role of diet in the treatment of diabetes and the relationship between the three main macronutrients and blood glucose level;Plate Method;Reviewed blood glucose goals for pre and post meals and how to evaluate the patients' food intake on their blood glucose level.;Information on hints to eating out and maintain blood glucose control.;Meal options for control of blood glucose level and chronic complications.    Being Active Role of exercise on diabetes management, blood pressure control and cardiac health.;Identified with patient nutritional and/or medication changes necessary with exercise.    Medications Reviewed patients medication for diabetes, action, purpose, timing of dose and side effects.    Monitoring Identified appropriate SMBG and/or A1C goals.    Acute complications Taught prevention, symptoms, and  treatment of hypoglycemia - the 15 rule.;Discussed and identified patients' prevention, symptoms, and treatment of hyperglycemia.    Chronic complications Relationship between chronic complications and blood glucose control    Diabetes Stress and Support Identified and addressed patients feelings and concerns about diabetes    Lifestyle and Health Coping Lifestyle issues that need to be addressed for better diabetes care      Individualized Goals (developed by patient)   Nutrition General guidelines for healthy choices and portions discussed    Physical Activity Exercise 5-7 days per week;60 minutes per day    Medications take my medication as prescribed    Monitoring  Test my blood glucose as discussed;Consistenly use CGM    Problem Solving Eating Pattern    Reducing Risk examine blood glucose patterns;do foot checks daily;treat hypoglycemia with 15 grams of carbs if blood glucose less than 70mg /dL    Health Coping Ask for help with psychological, social, or emotional issues      Patient Self-Evaluation of Goals - Patient rates self as meeting previously  set goals (% of time)   Nutrition 50 - 75 % (half of the time)    Physical Activity >75% (most of the time)    Medications >75% (most of the time)    Monitoring >75% (most of the time)    Problem Solving and behavior change strategies  50 - 75 % (half of the time)    Reducing Risk (treating acute and chronic complications) 50 - 75 % (half of the time)    Health Coping 50 - 75 % (half of the time)      Post-Education Assessment   Patient understands the diabetes disease and treatment process. Comprehends key points    Patient understands incorporating nutritional management into lifestyle. Demonstrates understanding / competency    Patient undertands incorporating physical activity into lifestyle. Demonstrates understanding / competency    Patient understands using medications safely. Demonstrates understanding / competency    Patient understands monitoring blood glucose, interpreting and using results Comprehends  key points    Patient understands prevention, detection, and treatment of acute complications. Comprehends key points    Patient understands prevention, detection, and treatment of chronic complications. Comprehends key points    Patient understands how to develop strategies to address psychosocial issues. Comprehends key points    Patient understands how to develop strategies to promote health/change behavior. Comprehends key points      Outcomes   Expected Outcomes Demonstrated interest in learning. Expect positive outcomes    Future DMSE 3-4 months    Program Status Not Completed      Subsequent Visit   Since your last visit have you continued or begun to take your medications as prescribed? Yes             Individualized Plan for Diabetes Self-Management Training:   Learning Objective:  Patient will have a greater understanding of diabetes self-management. Patient education plan is to attend individual and/or group sessions per assessed needs and concerns.   Plan:    Patient Instructions   Previous Goals: Continue to aim for at least 150 minutes of physical activity weekly.   Eat more Non-Starchy Vegetables. Make 1/2 of your plate non-starchy at least twice per day.  If having cereal and fruit OR oatmeal and fruit, aim to include a protein!  New Goal: Goal: aim to have a snack between both meals (carb + protein) Examples: protein shake and fruit, cheese and crackers, peanut butter toast, peanut butter crackers, apple and peanut butter.  Expected Outcomes:  Demonstrated interest in learning. Expect positive outcomes  Education material provided: No handouts provided on this follow up  If problems or questions, patient to contact team via:  Phone  Future DSME appointment: 3-4 months

## 2022-12-09 ENCOUNTER — Other Ambulatory Visit: Payer: Self-pay | Admitting: Family Medicine

## 2022-12-10 ENCOUNTER — Other Ambulatory Visit: Payer: Self-pay | Admitting: Family Medicine

## 2022-12-14 ENCOUNTER — Other Ambulatory Visit: Payer: Self-pay | Admitting: Family Medicine

## 2022-12-26 DIAGNOSIS — J3089 Other allergic rhinitis: Secondary | ICD-10-CM | POA: Diagnosis not present

## 2022-12-26 DIAGNOSIS — J3081 Allergic rhinitis due to animal (cat) (dog) hair and dander: Secondary | ICD-10-CM | POA: Diagnosis not present

## 2022-12-26 DIAGNOSIS — J301 Allergic rhinitis due to pollen: Secondary | ICD-10-CM | POA: Diagnosis not present

## 2023-01-12 DIAGNOSIS — M25562 Pain in left knee: Secondary | ICD-10-CM | POA: Diagnosis not present

## 2023-01-18 DIAGNOSIS — J301 Allergic rhinitis due to pollen: Secondary | ICD-10-CM | POA: Diagnosis not present

## 2023-01-18 DIAGNOSIS — J3089 Other allergic rhinitis: Secondary | ICD-10-CM | POA: Diagnosis not present

## 2023-01-18 DIAGNOSIS — J3081 Allergic rhinitis due to animal (cat) (dog) hair and dander: Secondary | ICD-10-CM | POA: Diagnosis not present

## 2023-01-24 DIAGNOSIS — J3081 Allergic rhinitis due to animal (cat) (dog) hair and dander: Secondary | ICD-10-CM | POA: Diagnosis not present

## 2023-01-24 DIAGNOSIS — J301 Allergic rhinitis due to pollen: Secondary | ICD-10-CM | POA: Diagnosis not present

## 2023-01-24 DIAGNOSIS — J3089 Other allergic rhinitis: Secondary | ICD-10-CM | POA: Diagnosis not present

## 2023-02-07 DIAGNOSIS — Z85828 Personal history of other malignant neoplasm of skin: Secondary | ICD-10-CM | POA: Diagnosis not present

## 2023-02-07 DIAGNOSIS — L570 Actinic keratosis: Secondary | ICD-10-CM | POA: Diagnosis not present

## 2023-02-07 DIAGNOSIS — D2371 Other benign neoplasm of skin of right lower limb, including hip: Secondary | ICD-10-CM | POA: Diagnosis not present

## 2023-02-07 DIAGNOSIS — L821 Other seborrheic keratosis: Secondary | ICD-10-CM | POA: Diagnosis not present

## 2023-02-15 ENCOUNTER — Encounter: Payer: Self-pay | Admitting: Family Medicine

## 2023-02-15 ENCOUNTER — Ambulatory Visit (INDEPENDENT_AMBULATORY_CARE_PROVIDER_SITE_OTHER): Payer: PPO | Admitting: Family Medicine

## 2023-02-15 VITALS — BP 124/62 | HR 64 | Temp 97.6°F | Ht 75.0 in | Wt 198.0 lb

## 2023-02-15 DIAGNOSIS — E039 Hypothyroidism, unspecified: Secondary | ICD-10-CM | POA: Diagnosis not present

## 2023-02-15 DIAGNOSIS — Z7985 Long-term (current) use of injectable non-insulin antidiabetic drugs: Secondary | ICD-10-CM | POA: Diagnosis not present

## 2023-02-15 DIAGNOSIS — E785 Hyperlipidemia, unspecified: Secondary | ICD-10-CM

## 2023-02-15 DIAGNOSIS — R972 Elevated prostate specific antigen [PSA]: Secondary | ICD-10-CM

## 2023-02-15 DIAGNOSIS — E119 Type 2 diabetes mellitus without complications: Secondary | ICD-10-CM

## 2023-02-15 LAB — POCT GLYCOSYLATED HEMOGLOBIN (HGB A1C): Hemoglobin A1C: 7.6 % — AB (ref 4.0–5.6)

## 2023-02-15 LAB — HEPATIC FUNCTION PANEL
ALT: 19 U/L (ref 0–53)
AST: 19 U/L (ref 0–37)
Albumin: 4.3 g/dL (ref 3.5–5.2)
Alkaline Phosphatase: 67 U/L (ref 39–117)
Bilirubin, Direct: 0.3 mg/dL (ref 0.0–0.3)
Total Bilirubin: 1.7 mg/dL — ABNORMAL HIGH (ref 0.2–1.2)
Total Protein: 6.8 g/dL (ref 6.0–8.3)

## 2023-02-15 LAB — BASIC METABOLIC PANEL
BUN: 26 mg/dL — ABNORMAL HIGH (ref 6–23)
CO2: 28 mEq/L (ref 19–32)
Calcium: 9.2 mg/dL (ref 8.4–10.5)
Chloride: 99 mEq/L (ref 96–112)
Creatinine, Ser: 0.87 mg/dL (ref 0.40–1.50)
GFR: 86.94 mL/min (ref >60.00)
Glucose, Bld: 234 mg/dL — ABNORMAL HIGH (ref 70–99)
Potassium: 3.9 mEq/L (ref 3.5–5.1)
Sodium: 138 mEq/L (ref 135–145)

## 2023-02-15 LAB — LIPID PANEL
Cholesterol: 107 mg/dL (ref 0–200)
HDL: 40.9 mg/dL (ref >39.00)
LDL Cholesterol: 40 mg/dL (ref 0–99)
NonHDL: 65.8
Total CHOL/HDL Ratio: 3
Triglycerides: 129 mg/dL (ref 0.0–149.0)
VLDL: 25.8 mg/dL (ref 0.0–40.0)

## 2023-02-15 LAB — MICROALBUMIN / CREATININE URINE RATIO
Creatinine,U: 48.1 mg/dL
Microalb Creat Ratio: 1.5 mg/g (ref 0.0–30.0)
Microalb, Ur: <0.7 mg/dL (ref 0.0–1.9)

## 2023-02-15 LAB — PSA, MEDICARE: PSA: 5.37 ng/ml — ABNORMAL HIGH (ref 0.10–4.00)

## 2023-02-15 LAB — TSH: TSH: 2.62 u[IU]/mL (ref 0.35–5.50)

## 2023-02-15 NOTE — Patient Instructions (Signed)
Increase the Ozempic to 0.5 mg [place] once weekly.

## 2023-02-15 NOTE — Progress Notes (Signed)
Established Patient Office Visit  Subjective   Patient ID: Aaron Bradshaw, male    DOB: July 08, 1951  Age: 72 y.o. MRN: [id number]  Chief Complaint  Patient presents with   Medical Management of Chronic Issues    HPI   Aaron Bradshaw is seen for medical follow-up.  He has history of type 2 diabetes, hypothyroidism, dyslipidemia, elevated PSA-has been followed by urology.  He is requesting repeat PSA today.  His last A1c was 7.2%.  Currently on Ozempic 0.25 mg once weekly.  He has lost some weight and does not want to lose much more weight.  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  He has hypothyroidism on levothyroxine and due for follow-up labs.  He takes rosuvastatin 10 mg daily for hyperlipidemia.  Denies any recent chest pains.  No reported falls.  Compliant with medications.  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 He has never used  smokeless tobacco. He reports current alcohol use of about 1.0 - 2.0 standard drink of alcohol per week. He reports that he does not use drugs. family history includes Arthritis in his mother; Depression in his father, mother, and sister; Diabetes in his maternal grandfather and mother; Heart disease in his father; OCD in his father; Parkinsonism in his mother; Prostate cancer in his father and paternal grandfather; Stroke (age of onset: 9) in his mother. Allergies  Allergen Reactions   Codeine Nausea Only   Prozac [Fluoxetine Hcl] Anxiety    Increased anxiety    Review of Systems  Constitutional:  Negative for malaise/fatigue.  Eyes:  Negative for blurred vision.  Respiratory:  Negative for shortness of breath.   Cardiovascular:  Negative for chest pain.  Gastrointestinal:  Negative for abdominal pain.  Neurological:  Negative for dizziness, weakness and headaches.      Objective:     BP 124/62 (BP Location: Left Arm, Patient Position: Sitting, Cuff Size: Normal)   Pulse 64   Temp 97.6 F (36.4 C) (Oral)   Ht 6\' 3"  (1.905 m)   Wt 198 lb (89.8 kg)   SpO2 98%  BMI 24.75 kg/m  BP Readings from Last 3 Encounters:  02/15/23 124/62  11/16/22 130/80  08/17/22 130/80   Wt Readings from Last 3 Encounters:  02/15/23 198 lb (89.8 kg)  11/23/22 203 lb (92.1 kg)  11/16/22 203 lb (92.1 kg)      Physical Exam Constitutional:      Appearance: He is well-developed.  Eyes:     Pupils: Pupils are equal, round, and reactive to light.  Neck:     Thyroid: No thyromegaly.  Cardiovascular:     Rate and Rhythm: Normal rate and regular rhythm.  Pulmonary:     Effort: Pulmonary effort is normal. No respiratory distress.     Breath sounds: Normal breath sounds. No wheezing or rales.  Musculoskeletal:     Cervical back: Neck supple.     Right lower leg: No edema.     Left lower leg: No edema.  Skin:    Comments: Feet reveal no skin lesions. Good distal foot pulses. Good capillary  refill. No calluses. Normal sensation with monofilament testing   Neurological:     Mental Status: He is alert and oriented to person, place, and time.      Results for orders placed or performed in visit on 02/15/23  POC HgB A1c  Result Value Ref Range   Hemoglobin A1C 7.6 (A) 4.0 - 5.6 %   HbA1c POC (<> result, manual entry)     HbA1c, POC (prediabetic range)     HbA1c, POC (controlled diabetic range)        The 10-year ASCVD risk score (Arnett DK, et al., 2019) is: 29.8%    Assessment & Plan:   Problem List Items Addressed This Visit       Unprioritized   Hypothyroid   Relevant Orders   TSH   Dyslipidemia   Relevant Orders   Lipid panel   Hepatic function panel   Type 2 diabetes mellitus, controlled (HCC) - Primary   Relevant Orders   POC HgB A1c (Completed)   Basic metabolic panel   Microalbumin / creatinine urine ratio   Other Visit Diagnoses     Increased prostate specific antigen (PSA) velocity       Relevant Orders   PSA, Medicare     Type 2 diabetes with A1c today 7.6%.  -We recommended titrating his Ozempic to 0.5 mg subcutaneous once weekly and reassess in 3 months.  Check urine microalbumin screen today.  Continue yearly diabetic eye exams.  -Discussed maintaining healthy weight.  Make sure he is getting adequate protein and continue resistance training.  -Check additional labs as above  -Patient requesting PSA recheck.  He has been followed by urology.  We can forward any PSA labs to his urologist  Return in about 3 months (around 05/18/2023).    Evelena Peat, MD

## 2023-03-11 ENCOUNTER — Other Ambulatory Visit: Payer: Self-pay | Admitting: Family Medicine

## 2023-03-14 ENCOUNTER — Ambulatory Visit: Payer: PPO | Admitting: Dietician

## 2023-03-16 DIAGNOSIS — R972 Elevated prostate specific antigen [PSA]: Secondary | ICD-10-CM | POA: Diagnosis not present

## 2023-03-18 ENCOUNTER — Other Ambulatory Visit: Payer: Self-pay | Admitting: Family Medicine

## 2023-03-20 DIAGNOSIS — J3089 Other allergic rhinitis: Secondary | ICD-10-CM | POA: Diagnosis not present

## 2023-03-20 DIAGNOSIS — J3081 Allergic rhinitis due to animal (cat) (dog) hair and dander: Secondary | ICD-10-CM | POA: Diagnosis not present

## 2023-03-20 DIAGNOSIS — J301 Allergic rhinitis due to pollen: Secondary | ICD-10-CM | POA: Diagnosis not present

## 2023-04-19 DIAGNOSIS — J3089 Other allergic rhinitis: Secondary | ICD-10-CM | POA: Diagnosis not present

## 2023-04-19 DIAGNOSIS — J3081 Allergic rhinitis due to animal (cat) (dog) hair and dander: Secondary | ICD-10-CM | POA: Diagnosis not present

## 2023-04-19 DIAGNOSIS — J301 Allergic rhinitis due to pollen: Secondary | ICD-10-CM | POA: Diagnosis not present

## 2023-04-20 DIAGNOSIS — E119 Type 2 diabetes mellitus without complications: Secondary | ICD-10-CM | POA: Diagnosis not present

## 2023-04-20 DIAGNOSIS — H5203 Hypermetropia, bilateral: Secondary | ICD-10-CM | POA: Diagnosis not present

## 2023-04-20 DIAGNOSIS — H31001 Unspecified chorioretinal scars, right eye: Secondary | ICD-10-CM | POA: Diagnosis not present

## 2023-04-20 DIAGNOSIS — H2513 Age-related nuclear cataract, bilateral: Secondary | ICD-10-CM | POA: Diagnosis not present

## 2023-04-20 LAB — HM DIABETES EYE EXAM

## 2023-04-28 DIAGNOSIS — J3089 Other allergic rhinitis: Secondary | ICD-10-CM | POA: Diagnosis not present

## 2023-04-28 DIAGNOSIS — J301 Allergic rhinitis due to pollen: Secondary | ICD-10-CM | POA: Diagnosis not present

## 2023-04-28 DIAGNOSIS — J3081 Allergic rhinitis due to animal (cat) (dog) hair and dander: Secondary | ICD-10-CM | POA: Diagnosis not present

## 2023-05-11 ENCOUNTER — Other Ambulatory Visit: Payer: Self-pay | Admitting: Family Medicine

## 2023-05-15 NOTE — Telephone Encounter (Signed)
Dr. Burchette pt  

## 2023-05-19 ENCOUNTER — Encounter: Payer: Self-pay | Admitting: Family Medicine

## 2023-05-19 ENCOUNTER — Ambulatory Visit (INDEPENDENT_AMBULATORY_CARE_PROVIDER_SITE_OTHER): Payer: PPO | Admitting: Family Medicine

## 2023-05-19 VITALS — BP 134/70 | HR 62 | Temp 98.1°F | Ht 75.0 in | Wt 201.8 lb

## 2023-05-19 DIAGNOSIS — E039 Hypothyroidism, unspecified: Secondary | ICD-10-CM

## 2023-05-19 DIAGNOSIS — Z7985 Long-term (current) use of injectable non-insulin antidiabetic drugs: Secondary | ICD-10-CM | POA: Diagnosis not present

## 2023-05-19 DIAGNOSIS — E119 Type 2 diabetes mellitus without complications: Secondary | ICD-10-CM | POA: Diagnosis not present

## 2023-05-19 DIAGNOSIS — E785 Hyperlipidemia, unspecified: Secondary | ICD-10-CM | POA: Diagnosis not present

## 2023-05-19 LAB — POCT GLYCOSYLATED HEMOGLOBIN (HGB A1C): Hemoglobin A1C: 8.2 % — AB (ref 4.0–5.6)

## 2023-05-19 MED ORDER — FREESTYLE LIBRE 3 READER DEVI: 1.0000 | Freq: Every day | 1 refills | Status: AC

## 2023-05-19 MED ORDER — FREESTYLE LIBRE 3 SENSOR MISC: 1 refills | Status: DC

## 2023-05-19 NOTE — Progress Notes (Signed)
Established Patient Office Visit  Subjective   Patient ID: Aaron Bradshaw, male    DOB: [birth year]  Age: 72 y.o. MRN: [id number]  No chief complaint on file.   HPI   Aaron Bradshaw is here for medical follow-up.  He has history of type 2 diabetes, hypothyroidism, hyperlipidemia.  Stays quite active.  He has been very health-conscious with exercise for years.  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  This keeps him quite active physically.  Because of cost issues he did reduce his Ozempic from 0.5 up to 0.25 and A1c today is back up to 8.2.  He had elevated PSA of 5.37 and saw urologist.  They repeated this and reportedly this came down and they are just monitoring for now.  He remains on rosuvastatin 10 mg daily for hyperlipidemia.  No significant myalgias.  On levothyroxine 75 mcg daily.  TSH in July was normal.  Past Medical History:  Diagnosis Date  . Allergy    allergy shots every 3 weeks   . Anxiety    past hx   . 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 Diabetes mellitus (HCC) 6/12   A1c 7.1 %   dx 4 yrs ago  . Gilbert syndrome   . Heart murmur    'when I was younger"  . History of chest pain    Hospitalized ER in [place] for CP and Palpitations with Neg Enzymes  . Hyperlipidemia   . Shingles    2015  . Testosterone deficiency    Dr Marcello Fennel  . Thyroid disease   . 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 APPENDECTOMY    . CHOLECYSTECTOMY  2010  . COLONOSCOPY     Tics, Polyp 12/26/2003; polyps 2010, Dr Leone Payor  . KNEE ARTHROSCOPY     Left  . LAPAROSCOPIC APPENDECTOMY N/A 10/05/2015   Procedure: APPENDECTOMY LAPAROSCOPIC;  Surgeon: Jimmye Norman, MD;  Location: [facility] OR;  Service: General;  Laterality: N/A;  . NOSE SURGERY     Dr  Nedra Hai  . POLYPECTOMY    . ROTATOR CUFF REPAIR Bilateral    left  . SHOULDER ARTHROSCOPY      reports that he has never smoked. He has never used smokeless tobacco. He  reports current alcohol use of about 1.0 - 2.0 standard drink of alcohol per week. He reports that he does not use drugs. family history includes Arthritis in his mother; Depression in his father, mother, and sister; Diabetes in his maternal grandfather and mother; Heart disease in his father; OCD in his father; Parkinsonism in his mother; Prostate cancer in his father and paternal grandfather; Stroke (age of onset: 42) in his mother. Allergies  Allergen Reactions  . Codeine Nausea Only  . Prozac [Fluoxetine Hcl] Anxiety    Increased anxiety    Review of Systems  Constitutional:  Negative for malaise/fatigue.  Eyes:  Negative for blurred vision.  Respiratory:  Negative for shortness of breath.   Cardiovascular:  Negative for chest pain.  Neurological:  Negative for dizziness, weakness and headaches.      Objective:     BP 134/70 (BP Location: Left Arm, Patient Position: Sitting, Cuff Size: Normal)   Pulse 62   Temp 98.1 F (36.7 C) (Oral)   Ht 6\' 3"  (1.905 m)   Wt 201 lb 12.8 oz (91.5 kg)   SpO2 99%   BMI 25.22 kg/m  BP Readings  from Last 3 Encounters:  05/19/23 134/70  02/15/23 124/62  11/16/22 130/80   Wt Readings from Last 3 Encounters:  05/19/23 201 lb 12.8 oz (91.5 kg)  02/15/23 198 lb (89.8 kg)  11/23/22 203 lb (92.1 kg)      Physical Exam Vitals reviewed.  Constitutional:      General: He is not in acute distress.    Appearance: He is well-developed. He is not ill-appearing.  Eyes:     Pupils: Pupils are equal, round, and reactive to light.  Neck:     Thyroid: No thyromegaly.  Cardiovascular:     Rate and Rhythm: Normal rate.     Comments: Occasional skipped beat but mostly regular Pulmonary:     Effort: Pulmonary effort is normal. No respiratory distress.     Breath sounds: Normal breath sounds. No wheezing or rales.  Musculoskeletal:     Cervical back: Neck supple.  Neurological:     Mental Status: He is alert and oriented to person, place, and  time.     Results for orders placed or performed in visit on 05/19/23  POC HgB A1c  Result Value Ref Range   Hemoglobin A1C 8.2 (A) 4.0 - 5.6 %   HbA1c POC (<> result, manual entry)     HbA1c, POC (prediabetic range)     HbA1c, POC (controlled diabetic range)      Last CBC Lab Results  Component Value Date   WBC 7.5 03/27/2018   HGB 15.8 03/27/2018   HCT 44.7 03/27/2018   MCV 87.2 03/27/2018   MCH 30.1 09/25/2015   RDW 13.3 03/27/2018   PLT 193.0 03/27/2018   Last metabolic panel Lab Results  Component Value Date   GLUCOSE 234 (H) 02/15/2023   NA 138 02/15/2023   K 3.9 02/15/2023   CL 99 02/15/2023   CO2 28 02/15/2023   BUN 26 (H) 02/15/2023   CREATININE 0.87 02/15/2023   GFR 86.94 02/15/2023   CALCIUM 9.2 02/15/2023   PROT 6.8 02/15/2023   ALBUMIN 4.3 02/15/2023   BILITOT 1.7 (H) 02/15/2023   ALKPHOS 67 02/15/2023   AST 19 02/15/2023   ALT 19 02/15/2023   ANIONGAP 9 09/25/2015   Last lipids Lab Results  Component Value Date   CHOL 107 02/15/2023   HDL 40.90 02/15/2023   LDLCALC 40 02/15/2023   LDLDIRECT 112.0 01/03/2017   TRIG 129.0 02/15/2023   CHOLHDL 3 02/15/2023   Last hemoglobin A1c Lab Results  Component Value Date   HGBA1C 8.2 (A) 05/19/2023   Last thyroid functions Lab Results  Component Value Date   TSH 2.62 02/15/2023      The ASCVD Risk score (Arnett DK, et al., 2019) failed to calculate for the following reasons:   The valid total cholesterol range is 130 to 320 mg/dL    Assessment & Plan:   #1 type 2 diabetes with exacerbation with A1c back up to 8.2% likely reflecting reduced dose of Ozempic.  Increase Ozempic back to 0.5 mg subcutaneous once weekly.  Set up 58-month follow-up.  #2 hypothyroidism treated with levothyroxine 75 mcg daily.  TSH was at goal back in July.  Continue current dosage  #3 hyperlipidemia treated with rosuvastatin 10 mg daily.  Tolerating well with no side effects.  Continue low saturated fat diet.  #4  elevated PSA.  Evaluated per urology.  Repeat labs reportedly improved.  He will continue follow-up with them regarding PSA   Return in about 3 months (around 08/19/2023).  Evelena Peat, MD

## 2023-05-19 NOTE — Patient Instructions (Signed)
A1C today back up to 8.2%  Increase the Ozempic back to 0.5 mg once weekly  Let's plan on 3 month follow up.

## 2023-05-24 ENCOUNTER — Encounter: Payer: Self-pay | Admitting: Dietician

## 2023-05-24 ENCOUNTER — Encounter: Payer: PPO | Attending: Family Medicine | Admitting: Dietician

## 2023-05-24 ENCOUNTER — Telehealth: Payer: Self-pay | Admitting: Family Medicine

## 2023-05-24 VITALS — Wt 201.0 lb

## 2023-05-24 DIAGNOSIS — E119 Type 2 diabetes mellitus without complications: Secondary | ICD-10-CM | POA: Diagnosis not present

## 2023-05-24 NOTE — Telephone Encounter (Signed)
Seeking a new referral to nutrition. The one they had has expired

## 2023-05-24 NOTE — Progress Notes (Signed)
Diabetes Self-Management Education  Visit Type: Follow-up  Appt. Start Time: 1400 Appt. End Time: 1425  05/24/2023  Aaron Bradshaw, identified by name and date of birth, is a 72 y.o. male with a diagnosis of Diabetes: type 2  .   ASSESSMENT  History includes: type 2 diabetes, allergies, anxiety, cancer, heart murmur, HLD, thyroid disease.  Labs noted: A1c 8.2% on 10/25//24 Medications include: metformin, jardiance, ozempic  Supplements: probiotic, co-Q10, krill oil, turmeric, magnesium, B complex, omega 3's, MVI, vitamin C, vitamin D.    Weight 11/23/2022: 203 lb Weight 05/24/2023: 201 lb  Pt A1c increased from 7.6 on 02/15/23 to 8.2 on 05/19/23  Pt states he is taking ozempic and reports his dose was 5 but he was takng 2.5 to save money because it costs him $100 per shot but since his A1c went up he plans to take his prescribed dose again.   Pt reports he had to go back to work and works for [employer].   Pt states he's been taking a protein drink or protein bar daily with 30 g protein between meals.   Previous Goals: continue all! Continue to aim for at least 150 minutes of physical activity weekly. - good job!   Eat more Non-Starchy Vegetables. Make 1/2 of your plate non-starchy at least twice per day. - goal in progress   If having cereal and fruit OR oatmeal and fruit, aim to include a protein! - goal met, good job  Aim to have a snack between both meals (carb + protein) - goal met, continue! Weight 201 lb (91.2 kg). Body mass index is 25.12 kg/m.   Diabetes Self-Management Education - 05/24/23 1403       Visit Information   Visit Type Follow-up      Health Coping   How would you rate your overall health? Good      Psychosocial Assessment   Patient Belief/Attitude about Diabetes Motivated to manage diabetes    What is the hardest part about your diabetes right now, causing you the most concern, or is the most worrisome to you about  your diabetes?   Making healty food and beverage choices    Self-care barriers None    Self-management support Doctor's office    Other persons present Patient    Patient Concerns Nutrition/Meal planning    Special Needs None    Preferred Learning Style No preference indicated    Learning Readiness Ready      Pre-Education Assessment   Patient understands the diabetes disease and treatment process. Needs Review    Patient understands incorporating nutritional management into lifestyle. Needs Review    Patient undertands incorporating physical activity into lifestyle. Needs Review    Patient understands using medications safely. Needs Review    Patient understands monitoring blood glucose, interpreting and using results Needs Review    Patient understands prevention, detection, and treatment of acute complications. Needs Review    Patient understands prevention, detection, and treatment of chronic complications. Needs Review    Patient understands how to develop strategies to address psychosocial issues. Needs Review    Patient understands how to develop strategies to promote health/change behavior. Needs Review      Complications   Last HgB A1C per patient/outside source 8.2 %    How often do you check your blood sugar? 1-2 times/day      Dietary Intake   Breakfast cereal and toast with peanut butter and jelly    Snack (morning) protein  bar OR shake    Lunch [place] and cheese sandwich on [employer] bread    Snack (afternoon) protein shake and apple    Dinner chicken or fish with vegetables and salad    Snack (evening) none    Beverage(s) water, occasional diet coke, coffee      Activity / Exercise   Activity / Exercise Type Moderate (swimming / aerobic walking)    How many days per week do you exercise? 5    How many minutes per day do you exercise? 45    Total minutes per week of exercise 225      Patient Education   Previous Diabetes Education Yes (please comment)    Disease  Pathophysiology Explored patient's options for treatment of their diabetes;Factors that contribute to the development of diabetes    Healthy Eating Role of diet in the treatment of diabetes and the relationship between the three main macronutrients and blood glucose level;Plate Method;Meal timing in regards to the patients' current diabetes medication.;Information on hints to eating out and maintain blood glucose control.    Being Active Role of exercise on diabetes management, blood pressure control and cardiac health.    Medications Reviewed patients medication for diabetes, action, purpose, timing of dose and side effects.    Monitoring Identified appropriate SMBG and/or A1C goals.;Daily foot exams;Yearly dilated eye exam    Diabetes Stress and Support Identified and addressed patients feelings and concerns about diabetes    Lifestyle and Health Coping Lifestyle issues that need to be addressed for better diabetes care      Individualized Goals (developed by patient)   Nutrition General guidelines for healthy choices and portions discussed    Physical Activity Exercise 5-7 days per week;45 minutes per day    Medications take my medication as prescribed    Monitoring  Test my blood glucose as discussed    Problem Solving Eating Pattern    Reducing Risk examine blood glucose patterns;do foot checks daily;treat hypoglycemia with 15 grams of carbs if blood glucose less than 70mg /dL    Health Coping Ask for help with psychological, social, or emotional issues      Patient Self-Evaluation of Goals - Patient rates self as meeting previously set goals (% of time)   Nutrition >75% (most of the time)    Physical Activity >75% (most of the time)    Medications >75% (most of the time)    Monitoring 50 - 75 % (half of the time)    Problem Solving and behavior change strategies  50 - 75 % (half of the time)    Reducing Risk (treating acute and chronic complications) >75% (most of the time)    Health  Coping >75% (most of the time)      Post-Education Assessment   Patient understands the diabetes disease and treatment process. Comprehends key points    Patient understands incorporating nutritional management into lifestyle. Comprehends key points    Patient undertands incorporating physical activity into lifestyle. Demonstrates understanding / competency    Patient understands using medications safely. Demonstrates understanding / competency    Patient understands monitoring blood glucose, interpreting and using results Comprehends key points    Patient understands prevention, detection, and treatment of acute complications. Comprehends key points    Patient understands prevention, detection, and treatment of chronic complications. Comprehends key points    Patient understands how to develop strategies to address psychosocial issues. Comprehends key points    Patient understands how to develop strategies to promote  health/change behavior. Comprehends key points      Outcomes   Expected Outcomes Demonstrated interest in learning. Expect positive outcomes    Future DMSE PRN    Program Status Completed      Subsequent Visit   Since your last visit have you continued or begun to take your medications as prescribed? Yes             Individualized Plan for Diabetes Self-Management Training:   Learning Objective:  Patient will have a greater understanding of diabetes self-management. Patient education plan is to attend individual and/or group sessions per assessed needs and concerns.   Plan:   There are no Patient Instructions on file for this visit.  Expected Outcomes:  Demonstrated interest in learning. Expect positive outcomes  Education material provided: no handouts provided on this follow up  If problems or questions, patient to contact team via:  Phone  Future DSME appointment: PRN

## 2023-05-26 NOTE — Addendum Note (Signed)
Addended by: Christy Sartorius on: 05/26/2023 09:20 AM   Modules accepted: Orders

## 2023-05-26 NOTE — Telephone Encounter (Signed)
Referral placed and left a message for the patient to return my call.

## 2023-05-30 NOTE — Telephone Encounter (Signed)
Patient aware referral has been placed.

## 2023-05-30 NOTE — Telephone Encounter (Signed)
Patient returned call

## 2023-06-02 DIAGNOSIS — J3089 Other allergic rhinitis: Secondary | ICD-10-CM | POA: Diagnosis not present

## 2023-06-02 DIAGNOSIS — J301 Allergic rhinitis due to pollen: Secondary | ICD-10-CM | POA: Diagnosis not present

## 2023-06-02 DIAGNOSIS — J3081 Allergic rhinitis due to animal (cat) (dog) hair and dander: Secondary | ICD-10-CM | POA: Diagnosis not present

## 2023-06-09 DIAGNOSIS — R972 Elevated prostate specific antigen [PSA]: Secondary | ICD-10-CM | POA: Diagnosis not present

## 2023-06-16 ENCOUNTER — Ambulatory Visit (INDEPENDENT_AMBULATORY_CARE_PROVIDER_SITE_OTHER): Payer: PPO | Admitting: Family Medicine

## 2023-06-16 ENCOUNTER — Encounter: Payer: Self-pay | Admitting: Family Medicine

## 2023-06-16 DIAGNOSIS — R002 Palpitations: Secondary | ICD-10-CM

## 2023-06-16 DIAGNOSIS — J301 Allergic rhinitis due to pollen: Secondary | ICD-10-CM | POA: Diagnosis not present

## 2023-06-16 DIAGNOSIS — E785 Hyperlipidemia, unspecified: Secondary | ICD-10-CM

## 2023-06-16 DIAGNOSIS — J3089 Other allergic rhinitis: Secondary | ICD-10-CM | POA: Diagnosis not present

## 2023-06-16 DIAGNOSIS — E1165 Type 2 diabetes mellitus with hyperglycemia: Secondary | ICD-10-CM | POA: Diagnosis not present

## 2023-06-16 DIAGNOSIS — J3081 Allergic rhinitis due to animal (cat) (dog) hair and dander: Secondary | ICD-10-CM | POA: Diagnosis not present

## 2023-06-16 DIAGNOSIS — R972 Elevated prostate specific antigen [PSA]: Secondary | ICD-10-CM | POA: Diagnosis not present

## 2023-06-16 NOTE — Progress Notes (Signed)
Established Patient Office Visit  Subjective   Patient ID: Aaron Bradshaw, male    DOB: May 18, 1951  Age: 72 y.o. MRN: [id number]  Chief Complaint  Patient presents with   Irregular Heart Beat    HPI   [name] has history of type 2 diabetes, hypothyroidism, dyslipidemia.  He is seen today with intermittent palpitations especially at night and worse when he lays on his left side.  He is had similar issues for decades going back into his 7s.  He is had event monitor at least twice which has shown PVCs.  No known history of atrial fibrillation.  Denies any recent chest pains.  Stays extremely active with exercise and does some physical landscaping work and has had no intolerance with exercise.  Symptoms seem to be absent with exertional activities and more at rest at night. No associated dizziness.  No syncope.  No exertional dyspnea.  Essentially no caffeine intake and very rare alcohol use.  Has been dealing with some stress recently and thinks that may be a trigger.  He states his father had CABG early 41s and apparently had a pacemaker.  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 He has never used smokeless tobacco. He reports current alcohol use of about 1.0 - 2.0 standard drink of alcohol per week. He reports that he does not use drugs. family history includes Arthritis in his mother; Depression in his father, mother, and sister; Diabetes in his maternal grandfather and mother; Heart disease in his father; OCD in his father; Parkinsonism in his mother; Prostate cancer in his father and paternal grandfather; Stroke (age of onset: 30) in his mother. Allergies  Allergen Reactions   Codeine Nausea Only   Prozac [Fluoxetine Hcl] Anxiety    Increased anxiety    Review of Systems  Constitutional:  Negative for malaise/fatigue.  Eyes:  Negative for blurred vision.  Respiratory:  Negative for shortness of breath.   Cardiovascular:  Positive for palpitations. Negative for chest pain, leg swelling and PND.  Neurological:  Negative for dizziness, weakness and headaches.      Objective:     BP (!) 144/80 (BP Location: Left Arm, Patient Position: Sitting,  Cuff Size: Normal)   Pulse 65   Temp 97.7 F (36.5 C) (Oral)   Ht 6\' 3"  (1.905 m)   Wt 202 lb 4.8 oz (91.8 kg)   SpO2 98%   BMI 25.29 kg/m  BP Readings from Last 3 Encounters:  06/16/23 (!) 144/80  05/19/23 134/70  02/15/23 124/62   Wt Readings from Last 3 Encounters:  06/16/23 202 lb 4.8 oz (91.8 kg)  05/24/23 201 lb (91.2 kg)  05/19/23 201 lb 12.8 oz (91.5 kg)      Physical Exam Vitals reviewed.  Constitutional:      General: He is not in acute distress.    Appearance: He is not ill-appearing.  Cardiovascular:     Heart sounds: No murmur heard.    Comments: Occasional premature beat but mostly regular.  Rate in the 60s. Pulmonary:     Effort: Pulmonary effort is normal.     Breath sounds: Normal breath sounds. No wheezing or rales.  Neurological:      Mental Status: He is alert.      No results found for any visits on 06/16/23.  Last CBC Lab Results  Component Value Date   WBC 7.5 03/27/2018   HGB 15.8 03/27/2018   HCT 44.7 03/27/2018   MCV 87.2 03/27/2018   MCH 30.1 09/25/2015   RDW 13.3 03/27/2018   PLT 193.0 03/27/2018   Last metabolic panel Lab Results  Component Value Date   GLUCOSE 234 (H) 02/15/2023   NA 138 02/15/2023   K 3.9 02/15/2023   CL 99 02/15/2023   CO2 28 02/15/2023   BUN 26 (H) 02/15/2023   CREATININE 0.87 02/15/2023   GFR 86.94 02/15/2023   CALCIUM 9.2 02/15/2023   PROT 6.8 02/15/2023   ALBUMIN 4.3 02/15/2023   BILITOT 1.7 (H) 02/15/2023   ALKPHOS 67 02/15/2023   AST 19 02/15/2023   ALT 19 02/15/2023   ANIONGAP 9 09/25/2015   Last lipids Lab Results  Component Value Date   CHOL 107 02/15/2023   HDL 40.90 02/15/2023   LDLCALC 40 02/15/2023   LDLDIRECT 112.0 01/03/2017   TRIG 129.0 02/15/2023   CHOLHDL 3 02/15/2023   Last hemoglobin A1c Lab Results  Component Value Date   HGBA1C 8.2 (A) 05/19/2023   Last thyroid functions Lab Results  Component Value Date   TSH 2.62 02/15/2023      The ASCVD Risk score (Arnett DK, et al., 2019) failed to calculate for the following reasons:   The valid total cholesterol range is 130 to 320 mg/dL    Assessment & Plan:   72 year old male with history of type 2 diabetes, hypothyroidism, hyperlipidemia with intermittent palpitations especially over the past few weeks.  Past history of PVCs.  No associated chest pains or dyspnea  EKG today shows normal sinus rhythm with rate around 59.  No PVCs or PACs.  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 Would recommend he watch and observe especially to make sure he is not having more frequent PVCs immediate postexercise which can be associated with CAD.  He has no exercise intolerance whatsoever.  -Discussed possible repeat event monitor but he declines at this  time -Continue to avoid regular use of caffeine and alcohol -We did discuss possible use of low-dose beta-blocker as needed for increased symptoms but he declines -Also stressed importance of staying well-hydrated -Follow-up immediately for any chest pains, increased dyspnea, or exercise intolerance  Evelena Peat, MD

## 2023-06-19 ENCOUNTER — Other Ambulatory Visit: Payer: Self-pay | Admitting: Family Medicine

## 2023-07-04 DIAGNOSIS — J3089 Other allergic rhinitis: Secondary | ICD-10-CM | POA: Diagnosis not present

## 2023-07-04 DIAGNOSIS — J3081 Allergic rhinitis due to animal (cat) (dog) hair and dander: Secondary | ICD-10-CM | POA: Diagnosis not present

## 2023-07-04 DIAGNOSIS — J301 Allergic rhinitis due to pollen: Secondary | ICD-10-CM | POA: Diagnosis not present

## 2023-07-21 ENCOUNTER — Ambulatory Visit (INDEPENDENT_AMBULATORY_CARE_PROVIDER_SITE_OTHER): Payer: PPO

## 2023-07-21 VITALS — BP 120/60 | Temp 98.0°F | Ht 75.0 in | Wt 203.5 lb

## 2023-07-21 DIAGNOSIS — Z0000 Encounter for general adult medical examination without abnormal findings: Secondary | ICD-10-CM

## 2023-07-21 NOTE — Patient Instructions (Addendum)
Mr. Lefebure , Thank you for taking time to come for your Medicare Wellness Visit. I appreciate your ongoing commitment to your health goals. Please review the following plan we discussed and let me know if I can assist you in the future.   Referrals/Orders/Follow-Ups/Clinician Recommendations:   This is a list of the screening recommended for you and due dates:  Health Maintenance  Topic Date Due   COVID-19 Vaccine (4 - 2024-25 season) 03/26/2023   Complete foot exam   05/19/2023   Hemoglobin A1C  11/17/2023   Colon Cancer Screening  01/22/2024   Yearly kidney function blood test for diabetes  02/15/2024   Yearly kidney health urinalysis for diabetes  02/15/2024   Eye exam for diabetics  04/19/2024   Medicare Annual Wellness Visit  07/20/2024   DTaP/Tdap/Td vaccine (2 - Td or Tdap) 01/04/2027   Pneumonia Vaccine  Completed   Flu Shot  Completed   Hepatitis C Screening  Completed   Zoster (Shingles) Vaccine  Completed   HPV Vaccine  Aged Out    Advanced directives: (Copy Requested) Please bring a copy of your health care power of attorney and living will to the office to be added to your chart at your convenience.  Next Medicare Annual Wellness Visit scheduled for next year: Yes

## 2023-07-21 NOTE — Progress Notes (Signed)
Medicare  Subjective:   Aaron Bradshaw is a 72 y.o. male who presents for Medicare Annual/Subsequent preventive examination.  Visit Complete: In person  Patient Medicare AWV questionnaire was completed by the patient on 07/17/23; I have confirmed that all information answered by patient is correct and no changes since this date.  Cardiac Risk Factors include: advanced age (>60men, >49 women);male gender;diabetes mellitus     Objective:    Today's Vitals   07/21/23 1003  BP: 120/60  Temp: 98 F (36.7 C)  TempSrc: Oral  Weight: 203 lb 8 oz (92.3 kg)  Height: 6\' 3"  (1.905 m)   Body mass index is 25.44 kg/m.     07/21/2023   10:22 AM 07/13/2022    8:31 AM 05/25/2022    9:26 AM 07/12/2021    8:34 AM 04/03/2020    8:23 AM 11/28/2017    9:09 AM 09/25/2015   10:23 AM  Advanced Directives  Does Patient Have a Medical Advance Directive? Yes Yes Yes Yes Yes No No  Type of Estate agent of [place];Living will Healthcare Power of [place];Living will  Healthcare Power of [place];Living will Healthcare Power of [place];Living will    Does patient want to make changes to medical advance directive?   No - Patient declined No - Patient declined     Copy of Healthcare Power of Attorney in Chart? No - copy requested No - copy requested  No - copy requested No - copy requested    Would patient like information on creating a medical advance directive?       Yes - Educational materials given    Current Medications (verified) Outpatient Encounter Medications as of 07/21/2023  Medication Sig   Ascorbic Acid (VITAMIN C) 250 MG tablet Take 250 mg by mouth daily.   b complex vitamins capsule Take 1 capsule by mouth daily.   CHOLECALCIFEROL PO Take 1 tablet by mouth daily.   co-enzyme Q-10 30 MG capsule Take 30 mg by mouth daily.   Continuous Glucose Receiver (FREESTYLE LIBRE 3 READER) DEVI 1 each by Does not apply route daily at 2 PM.   Continuous Glucose Sensor  (FREESTYLE LIBRE 3 SENSOR) MISC Use as directed to check blood sugar daily   EPIPEN 2-PAK 0.3 MG/0.3ML SOAJ injection USE AS DIRECTED AS NEEDED   glucose blood (ONETOUCH VERIO) test strip Use as instructed once a day   JARDIANCE 25 MG TABS tablet TAKE 1 TABLET BY MOUTH EVERY DAY BEFORE BREAKFAST   KRILL OIL PO Take 1 capsule by mouth daily.   levothyroxine (SYNTHROID) 75 MCG tablet TAKE 1 TABLET BY MOUTH EVERY DAY   MAGNESIUM PO Take 1 tablet by mouth daily.   metFORMIN (GLUCOPHAGE-XR) 750 MG 24 hr tablet TAKE 1 TABLET BY MOUTH TWICE A DAY   Omega-3 Fatty Acids (FISH OIL) 1000 MG CAPS Take by mouth.   OneTouch Delica Lancets 33G MISC Use as directed once a day   OZEMPIC, 0.25 OR 0.5 MG/DOSE, 2 MG/3ML SOPN INJECT 0.5 MG INTO THE SKIN ONE TIME PER WEEK   PRESCRIPTION MEDICATION Inject 1 each into the skin every 7 (seven) days. Patient gets an allergy shot once a week at [facility] off of [address]   Probiotic Product (PROBIOTIC DAILY PO) Take by mouth daily.   rosuvastatin (CRESTOR) 10 MG tablet TAKE 1 TABLET BY MOUTH EVERY DAY   [DISCONTINUED] imiquimod (ALDARA) 5 % cream APPLY 1 APPLICATION APPLY ON THE SKIN AS DIRECTED APPLY EVERY OTHER NIGHT FOR 4 WEEKS.  No facility-administered encounter medications on file as of 07/21/2023.    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   Past Surgical History:  Procedure Laterality Date   APPENDECTOMY     CHOLECYSTECTOMY  2010    COLONOSCOPY     Tics, Polyp 12/26/2003; polyps 2010, Dr Leone Payor   KNEE ARTHROSCOPY     Left   LAPAROSCOPIC APPENDECTOMY N/A 10/05/2015   Procedure: APPENDECTOMY LAPAROSCOPIC;  Surgeon: Jimmye Norman, MD;  Location: [facility];  Service: General;  Laterality: N/A;   NOSE SURGERY     Dr  Nedra Hai   POLYPECTOMY     ROTATOR CUFF REPAIR Bilateral    left   SHOULDER ARTHROSCOPY     Family History  Problem Relation Age of Onset   Depression Father    Heart disease Father        MI in 41s   Prostate cancer Father    OCD Father    Depression Mother    Arthritis Mother    Stroke Mother 52   Parkinsonism Mother    Diabetes Mother    Depression Sister        OCD   Prostate cancer Paternal Grandfather    Diabetes Maternal Grandfather        MI in 53s   Colon cancer Neg Hx    Colon polyps Neg Hx    Esophageal cancer Neg Hx    Rectal cancer Neg Hx    Stomach cancer Neg Hx    Social History   Socioeconomic History   Marital status: Married    Spouse name: Not on file   Number of children: Not on file   Years of education: Not on file   Highest education level: Bachelor's degree (e.g., BA, AB, BS)  Occupational History   Not on file  Tobacco Use   Smoking status: Never   Smokeless tobacco: Never  Vaping Use   Vaping status: Never Used  Substance and Sexual Activity   Alcohol use: Yes    Alcohol/week: 1.0 - 2.0 standard drink of alcohol    Types: 1 - 2 Standard drinks or equivalent per week    Comment: beer occ stopped wine   Drug use: No   Sexual activity: Not on file  Other Topics Concern   Not on file  Social History Narrative   Not on file   Social Drivers of Health   Financial Resource Strain: Low Risk  (07/21/2023)   Overall Financial Resource Strain (CARDIA)    Difficulty of Paying Living Expenses: Not hard at all  Food Insecurity: No Food Insecurity (07/21/2023)   Hunger Vital Sign    Worried About Running Out of Food in the Last Year: Never true    Ran Out of Food in  the Last Year: Never true  Transportation Needs: No Transportation Needs (07/21/2023)   PRAPARE - Administrator, Civil Service (Medical): No    Lack of Transportation (Non-Medical): No  Physical Activity: Sufficiently Active (07/21/2023)   Exercise Vital Sign    Days of Exercise per Week: 7 days    Minutes of Exercise per Session: 60 min  Stress: No Stress Concern Present (07/21/2023)   [employer] of Occupational Health - Occupational Stress Questionnaire    Feeling of Stress : Not at all  Social Connections: Socially Integrated (07/21/2023)   Social Connection and Isolation Panel [NHANES]    Frequency of Communication with Friends and Family: More than three times a week    Frequency of Social Gatherings with Friends and Family: More than three times a week    Attends Religious Services: More than 4 times per year    Active Member of [employer] or Organizations: Yes    Attends Engineer, structural: More than 4 times per year    Marital Status: Married    Tobacco Counseling Counseling given: Not Answered   Clinical Intake:  Pre-visit preparation completed: Yes  Pain : No/denies pain     BMI - recorded: 25.44 Nutritional Status: BMI 25 -29 Overweight Nutritional Risks: None Diabetes: Yes CBG done?: Yes (CBG 140 per patient) CBG resulted in Enter/ Edit results?: Yes Did pt. bring in CBG monitor from home?: No  How often do you need to have someone help you when you read instructions, pamphlets, or other written materials from your doctor or pharmacy?: 1 - Never  Interpreter Needed?: No  Information entered by :: Theresa Mulligan LPN   Activities of Daily Living    07/21/2023   10:17 AM 07/17/2023    7:47 PM  In your present state of health, do you have any difficulty performing the following activities:  Hearing? 0 0  Vision? 0 0  Difficulty concentrating or making decisions? 0 0  Walking or climbing stairs? 0 0  Dressing or bathing? 0 0   Doing errands, shopping? 0 0  Preparing Food and eating ? N N  Using the Toilet? N N  In the past six months, have you accidently leaked urine? N   Do you have problems with loss of bowel control? N N  Managing your Medications? N N  Managing your Finances? N N  Housekeeping or managing your Housekeeping? N N    Patient Care Team: Kristian Covey, MD as PCP - General (Family Medicine)  Indicate any recent Medical Services you may have received from other than [facility] providers in the past year (date may be approximate).     Assessment:   This is a routine wellness examination for [place].  Hearing/Vision screen Hearing Screening - Comments:: Denies hearing difficulties   Vision Screening - Comments:: Wears rx glasses - up to date with routine eye exams with Dr Emily Filbert    Goals Addressed               This Visit's Progress     Stay Active (pt-stated)         Depression Screen    07/21/2023   10:20 AM 05/19/2023    8:22 AM 11/16/2022   10:17 AM 07/13/2022    8:29 AM 05/25/2022    9:26 AM 09/14/2021    3:37 PM 07/12/2021    8:28 AM  PHQ 2/9 Scores  PHQ - 2 Score 0 0 0 0 0 0 0  PHQ- 9 Score   1   1     Fall Risk    07/21/2023   10:21 AM 07/17/2023    7:47 PM 05/19/2023    8:22 AM 05/16/2023    9:08 PM 11/16/2022   10:17 AM  Fall Risk   Falls in the past year? 0 0 0 0 0  Number falls in past yr: 0  0  0  Injury with Fall? 0 0 0  0  Risk for fall due to : No Fall Risks  No Fall Risks  No Fall Risks  Follow up Falls prevention discussed  Falls evaluation completed  Falls evaluation completed    MEDICARE RISK AT HOME: Medicare Risk at Home Any stairs in or around the home?: (Patient-Rptd) No If so, are there any without handrails?: (Patient-Rptd) No Home free of loose throw rugs in walkways, pet beds, electrical cords, etc?: (Patient-Rptd) Yes Adequate lighting in your home to reduce risk of falls?: (Patient-Rptd) Yes Life alert?: (Patient-Rptd) No Use of  a cane, walker or w/c?: (Patient-Rptd) No Grab bars in the bathroom?: (Patient-Rptd) No Shower chair or bench in shower?: (Patient-Rptd) No Elevated toilet seat or a handicapped toilet?: (Patient-Rptd) No  TIMED UP AND GO:  Was the test performed?  Yes  Length of time to ambulate 10 feet: 10 sec Gait steady and fast without use of assistive device    Cognitive Function:    11/28/2017    9:16 AM  MMSE - Mini Mental State Exam  Not completed: --        07/21/2023   10:22 AM 07/13/2022    8:31 AM 07/12/2021    8:32 AM  6CIT Screen  What Year? 0 points 0 points 0 points  What month? 0 points 0 points 0 points  What time? 0 points 0 points 0 points  Count back from 20 0 points 0 points 0 points  Months in reverse 0 points 0 points 0 points  Repeat phrase 0 points 0 points 0 points  Total Score 0 points 0 points 0 points    Immunizations Immunization History  Administered Date(s) Administered   Fluad Quad(high Dose 65+) 05/14/2019, 06/04/2020, 05/18/2022   Fluad Trivalent(High Dose 65+) 04/18/2023   Influenza Whole 05/25/2012   Influenza, High Dose Seasonal PF 10/06/2016, 04/12/2017, 10/12/2017, 11/12/2019, 05/05/2021, 07/29/2021   Influenza,inj,Quad PF,6+ Mos 04/17/2013, 07/02/2015, 04/18/2016, 04/13/2018   PFIZER(Purple Top)SARS-COV-2 Vaccination 09/01/2019, 09/25/2019, 11/12/2019   Pneumococcal Conjugate-13 01/03/2017, 01/10/2018   Pneumococcal Polysaccharide-23 05/16/2013, 10/06/2016, 10/12/2017, 11/12/2018, 11/12/2019, 07/29/2021   Tdap 01/03/2017   Zoster Recombinant(Shingrix) 08/18/2021, 02/18/2022   Zoster, Live 05/14/2014    TDAP status: Up to date  Flu Vaccine status: Up to date  Pneumococcal vaccine status: Up to date  Covid-19 vaccine status: Declined, Education has been provided regarding the importance of this vaccine but patient still declined. Advised may receive this vaccine at local pharmacy or Health Dept.or vaccine clinic. Aware to provide a copy of  the vaccination record if obtained from local pharmacy or Health Dept. Verbalized acceptance and understanding.  Qualifies for Shingles Vaccine? Yes   Zostavax completed Yes   Shingrix Completed?: Yes  Screening Tests Health Maintenance  Topic Date Due   COVID-19 Vaccine (4 - 2024-25 season) 03/26/2023   FOOT EXAM  05/19/2023   HEMOGLOBIN A1C  11/17/2023   Colonoscopy  01/22/2024   Diabetic kidney evaluation - eGFR measurement  02/15/2024   Diabetic kidney evaluation - Urine ACR  02/15/2024   OPHTHALMOLOGY EXAM  04/19/2024   Medicare Annual Wellness (AWV)  07/20/2024   DTaP/Tdap/Td (2 - Td or Tdap) 01/04/2027   Pneumonia Vaccine 37+ Years old  Completed   INFLUENZA VACCINE  Completed   Hepatitis C Screening  Completed   Zoster Vaccines- Shingrix  Completed   HPV VACCINES  Aged Out    Health Maintenance  Health Maintenance Due  Topic Date Due   COVID-19 Vaccine (4 - 2024-25 season) 03/26/2023   FOOT EXAM  05/19/2023    Colorectal cancer screening: Type of screening: Colonoscopy. Completed 01/22/19. Repeat every 5 years    Hepatitis C Screening: does qualify; Completed 12/02/15  Vision Screening: Recommended annual ophthalmology exams for early detection of glaucoma and other disorders of the eye. Is the patient up to date with their annual eye exam?  Yes  Who is the provider or what is the name of the office in which the patient attends annual eye exams? Dr Emily Filbert If pt is not established with a provider, would they like to be referred to a provider to establish care? No .   Dental Screening: Recommended annual dental exams for proper oral hygiene  Diabetic Foot Exam: Diabetic Foot Exam: Overdue, Pt has been advised about the importance in completing this exam. Pt is scheduled for diabetic foot exam on Deferred.  Community Resource Referral / Chronic Care Management:  CRR required this visit?  No   CCM required this visit?  No     Plan:     I have personally  reviewed and noted the following in the patient's chart:   Medical and social history Use of alcohol, tobacco or illicit drugs  Current medications and supplements including opioid prescriptions. Patient is not currently taking opioid prescriptions. Functional ability and status Nutritional status Physical activity Advanced directives List of other physicians Hospitalizations, surgeries, and ER visits in previous 12 months Vitals Screenings to include cognitive, depression, and falls Referrals and appointments  In addition, I have reviewed and discussed with patient certain preventive protocols, quality metrics, and best practice recommendations. A written personalized care plan for preventive services as well as general preventive health recommendations were provided to patient.     Tillie Rung, LPN   [id number]/29/[id number]   After Visit Summary: (In Person-Printed) AVS printed and given to the patient  Nurse Notes: None

## 2023-08-01 DIAGNOSIS — J3081 Allergic rhinitis due to animal (cat) (dog) hair and dander: Secondary | ICD-10-CM | POA: Diagnosis not present

## 2023-08-01 DIAGNOSIS — J301 Allergic rhinitis due to pollen: Secondary | ICD-10-CM | POA: Diagnosis not present

## 2023-08-01 DIAGNOSIS — H1045 Other chronic allergic conjunctivitis: Secondary | ICD-10-CM | POA: Diagnosis not present

## 2023-08-01 DIAGNOSIS — J3089 Other allergic rhinitis: Secondary | ICD-10-CM | POA: Diagnosis not present

## 2023-08-10 DIAGNOSIS — J301 Allergic rhinitis due to pollen: Secondary | ICD-10-CM | POA: Diagnosis not present

## 2023-08-10 DIAGNOSIS — J3081 Allergic rhinitis due to animal (cat) (dog) hair and dander: Secondary | ICD-10-CM | POA: Diagnosis not present

## 2023-08-10 DIAGNOSIS — J3089 Other allergic rhinitis: Secondary | ICD-10-CM | POA: Diagnosis not present

## 2023-08-14 DIAGNOSIS — L570 Actinic keratosis: Secondary | ICD-10-CM | POA: Diagnosis not present

## 2023-08-14 DIAGNOSIS — D485 Neoplasm of uncertain behavior of skin: Secondary | ICD-10-CM | POA: Diagnosis not present

## 2023-08-14 DIAGNOSIS — Z85828 Personal history of other malignant neoplasm of skin: Secondary | ICD-10-CM | POA: Diagnosis not present

## 2023-08-14 DIAGNOSIS — D045 Carcinoma in situ of skin of trunk: Secondary | ICD-10-CM | POA: Diagnosis not present

## 2023-08-21 ENCOUNTER — Ambulatory Visit: Payer: PPO | Admitting: Family Medicine

## 2023-08-21 DIAGNOSIS — C61 Malignant neoplasm of prostate: Secondary | ICD-10-CM | POA: Diagnosis not present

## 2023-08-22 DIAGNOSIS — J301 Allergic rhinitis due to pollen: Secondary | ICD-10-CM | POA: Diagnosis not present

## 2023-08-22 DIAGNOSIS — J3089 Other allergic rhinitis: Secondary | ICD-10-CM | POA: Diagnosis not present

## 2023-08-22 DIAGNOSIS — J3081 Allergic rhinitis due to animal (cat) (dog) hair and dander: Secondary | ICD-10-CM | POA: Diagnosis not present

## 2023-08-28 DIAGNOSIS — C61 Malignant neoplasm of prostate: Secondary | ICD-10-CM | POA: Diagnosis not present

## 2023-08-30 ENCOUNTER — Ambulatory Visit: Payer: PPO | Admitting: Family Medicine

## 2023-08-30 ENCOUNTER — Encounter: Payer: Self-pay | Admitting: Family Medicine

## 2023-08-30 ENCOUNTER — Other Ambulatory Visit (HOSPITAL_COMMUNITY): Payer: Self-pay | Admitting: Urology

## 2023-08-30 VITALS — BP 130/64 | HR 63 | Temp 97.8°F | Wt 207.9 lb

## 2023-08-30 DIAGNOSIS — J3081 Allergic rhinitis due to animal (cat) (dog) hair and dander: Secondary | ICD-10-CM | POA: Diagnosis not present

## 2023-08-30 DIAGNOSIS — E039 Hypothyroidism, unspecified: Secondary | ICD-10-CM | POA: Diagnosis not present

## 2023-08-30 DIAGNOSIS — E1165 Type 2 diabetes mellitus with hyperglycemia: Secondary | ICD-10-CM | POA: Diagnosis not present

## 2023-08-30 DIAGNOSIS — J301 Allergic rhinitis due to pollen: Secondary | ICD-10-CM | POA: Diagnosis not present

## 2023-08-30 DIAGNOSIS — Z7984 Long term (current) use of oral hypoglycemic drugs: Secondary | ICD-10-CM

## 2023-08-30 DIAGNOSIS — J3089 Other allergic rhinitis: Secondary | ICD-10-CM | POA: Diagnosis not present

## 2023-08-30 DIAGNOSIS — C61 Malignant neoplasm of prostate: Secondary | ICD-10-CM

## 2023-08-30 MED ORDER — FREESTYLE LIBRE 3 SENSOR MISC: 1 refills | Status: AC

## 2023-08-30 NOTE — Progress Notes (Signed)
 Established Patient Office Visit  Subjective   Patient ID: Aaron Bradshaw, male    DOB: 11/22/1950  Age: 73 y.o. MRN: [id number]  Chief Complaint  Patient presents with   Medical Management of Chronic Issues    HPI   [name] is seen for medical follow-up.  He has history of elevated PSA which has been followed for some time by urology.  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  They have proposed radiation therapy but he is not fully decided yet.  He is leaning toward that therapy.  He has not been taking his Ozempic  for the past month.  His last A1c was 8.2%.  He apparently stopped this with concerns that he was losing too much weight.  At 1 point his weight was down to 193 pounds.  Currently 207.  He does relate having good appetite the entire time he was on Ozempic .  He does remain on Jardiance  and metformin .  He uses freestyle herlene but apparently ran out of sensors a month ago so has not been checking blood sugars regularly.  Prior to running out his fastings were usually about 160-170.  Recent issues of progressive right hip pain.  He has pending follow-up with sports medicine.  Starting to affect his day-to-day activities more.  He has pain both anteriorly, medially, and sometimes laterally.  He has difficulty crossing right leg over left.  Very much limited range of motion.  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 He has never used smokeless tobacco. He reports current alcohol use of about 1.0 - 2.0 standard drink of alcohol per week. He reports that he does not use drugs. family history includes Arthritis in his mother; Depression in his father, mother, and sister; Diabetes in his maternal grandfather and mother; Heart disease in his father; OCD in his father; Parkinsonism in his mother; Prostate cancer in his father and paternal grandfather; Stroke (age of onset: 21) in his mother. Allergies  Allergen Reactions   Codeine Nausea Only   Prozac  [Fluoxetine  Hcl] Anxiety    Increased anxiety    Review of Systems  Constitutional:  Negative for malaise/fatigue.  Eyes:  Negative for blurred vision.  Respiratory:  Negative for shortness of breath.   Cardiovascular:  Negative for chest pain.  Neurological:  Negative for dizziness, weakness and headaches.      Objective:     BP 130/64 (BP Location: Left Arm, Patient Position: Sitting, Cuff Size: Normal)   Pulse 63   Temp 97.8 F (36.6 C) (Oral)   Wt 207 lb 14.4 oz (94.3 kg)   SpO2 98%   BMI 25.99 kg/m  BP Readings from Last 3 Encounters:  08/30/23 130/64  07/21/23 120/60  06/16/23 (!) 144/80   Wt Readings from Last 3 Encounters:  08/30/23 207 lb 14.4 oz (94.3 kg)  07/21/23 203 lb 8 oz (92.3 kg)  06/16/23 202 lb 4.8 oz (91.8 kg)      Physical Exam Vitals reviewed.  Constitutional:      Appearance: He is well-developed.  HENT:     Right Ear: External ear normal.     Left Ear: External ear  normal.  Eyes:     Pupils: Pupils are equal, round, and reactive to light.  Neck:     Thyroid : No thyromegaly.  Cardiovascular:     Rate and Rhythm: Normal rate and regular rhythm.  Pulmonary:     Effort: Pulmonary effort is normal. No respiratory distress.     Breath sounds: Normal breath sounds. No wheezing or rales.  Musculoskeletal:     Cervical back: Neck supple.     Comments: Restricted range of motion right hip especially with internal rotation.  Neurological:     Mental Status: He is alert and oriented to person, place, and time.      No results found for any visits on 08/30/23.  Last CBC Lab Results  Component Value Date   WBC 7.5 03/27/2018   HGB 15.8 03/27/2018   HCT 44.7 03/27/2018   MCV 87.2 03/27/2018   MCH 30.1 09/25/2015   RDW 13.3 03/27/2018   PLT 193.0 03/27/2018   Last metabolic panel Lab Results  Component Value Date   GLUCOSE 234 (H) 02/15/2023   NA 138 02/15/2023   K 3.9 02/15/2023   CL 99 02/15/2023   CO2 28 02/15/2023   BUN 26 (H) 02/15/2023   CREATININE 0.87 02/15/2023   GFR 86.94 02/15/2023   CALCIUM  9.2 02/15/2023   PROT 6.8 02/15/2023   ALBUMIN 4.3 02/15/2023   BILITOT 1.7 (H) 02/15/2023   ALKPHOS 67 02/15/2023   AST 19 02/15/2023   ALT 19 02/15/2023   ANIONGAP 9 09/25/2015   Last lipids Lab Results  Component Value Date   CHOL 107 02/15/2023   HDL 40.90 02/15/2023   LDLCALC 40 02/15/2023   LDLDIRECT 112.0 01/03/2017   TRIG 129.0 02/15/2023   CHOLHDL 3 02/15/2023   Last hemoglobin A1c Lab Results  Component Value Date   HGBA1C 8.2 (A) 05/19/2023   Last thyroid  functions Lab Results  Component Value Date   TSH 2.62 02/15/2023      The ASCVD Risk score (Arnett DK, et al., 2019) failed to calculate for the following reasons:   The valid total cholesterol range is 130 to 320 mg/dL    Assessment & Plan:   #1 type 2 diabetes.  History of suboptimal control with last A1c 8.2%.  We did not have any poc A1C tests in  office today.  We have encouraged him to get back on Ozempic  0.25 mg subcutaneous once weekly and monitor blood sugars closely.  Refill sent for freestyle libre sensors.  If blood sugar adequately controlled with fastings 130 or less on low-dose Ozempic  continue that dose otherwise titrate in 1 month to 0.5 mg.  Continue Jardiance  and metformin .  He also relates he has had some poor dietary compliance at times will try to tighten that up.  He states he has a sweet tooth .  We did discuss possible apps for tracking carb grams per day.  #2 progressive right hip pain.  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  He has follow-up pending with sports medicine so we deferred getting any x-rays today.  Denies recent injury.  #3 hypothyroidism.  TSH at goal with lab check last summer.  Continue levothyroxine  75 mcg daily.  Wolm Scarlet, MD

## 2023-08-30 NOTE — Patient Instructions (Signed)
 Get back on the Ozempic  0.25 mg  once weekly  After one month, if tolerating well, increase the Ozempic  further to 0.50mg     Let's plan on 3 month follow up.

## 2023-08-31 NOTE — Progress Notes (Signed)
 [facility] [address] [place] [address] Phone: [phone] Subjective:   Aaron Bradshaw, am serving as a scribe for Dr. Arthea Claudene.  I'm seeing this patient by the request  of:  Micheal Wolm ORN, MD  CC: Hip pain    YEP:Dlagzrupcz  Aaron Bradshaw is a 73 y.o. male coming in with complaint of hip pain.  Past medical history is significant for type 2 diabetes, transverse myelitis and has complained of leg paresthesia.  Patient states R hip and foot pain. Gradually gotten worse the last few months. Pain is more intense. The longer on feet it hurts, and sitting for a long time hard to get up. Located over GT and SI. Has been seeing a land. Has plantar fasciitis, but bottom of R foot in discomfort. Just diagnosed with prostate cancer, hasn't seen oncology.   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  Patient's labs has had more difficulty over the course the last year with patient's A1c.  Also 6 months ago did have an elevation in PSA fairly dramatically.  Repeat was done in November showing a little bit decreasing from the high 5.6-4.3.  Still significantly elevated from patient's previous baseline.    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   Diabetes mellitus (HCC) 6/12   A1c 7.1 %   dx 4 yrs ago   Bertrum syndrome    Heart murmur    'when I was younger   History of chest pain    Hospitalized ER in [place] for CP and Palpitations with Neg Enzymes   Hyperlipidemia    Shingles    2015   Testosterone deficiency    Dr Duncan   Thyroid  disease    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   Past Surgical History:  Procedure  Laterality Date   APPENDECTOMY     CHOLECYSTECTOMY  2010   COLONOSCOPY     Tics, Polyp 12/26/2003; polyps 2010, Dr Avram   KNEE ARTHROSCOPY     Left   LAPAROSCOPIC APPENDECTOMY N/A 10/05/2015   Procedure: APPENDECTOMY LAPAROSCOPIC;  Surgeon: Lynwood Pina, MD;  Location: [facility] OR;  Service: General;  Laterality: N/A;   NOSE SURGERY     Dr  Jama   POLYPECTOMY     ROTATOR CUFF REPAIR Bilateral    left   SHOULDER ARTHROSCOPY     Social History   Socioeconomic History   Marital status: Married    Spouse name: Not on file   Number of children: Not on file   Years of education: Not on file   Highest education level: Bachelor's degree (e.g., BA, AB, BS)  Occupational History   Not on file  Tobacco Use   Smoking status: Never   Smokeless tobacco: Never  Vaping Use   Vaping status: Never Used  Substance and Sexual Activity   Alcohol use: Yes    Alcohol/week: 1.0 - 2.0 standard drink of alcohol    Types: 1 - 2 Standard drinks or equivalent per week    Comment: beer occ stopped wine   Drug use: No   Sexual activity: Not on  file  Other Topics Concern   Not on file  Social History Narrative   Not on file   Social Drivers of Health   Financial Resource Strain: Low Risk  (08/29/2023)   Overall Financial Resource Strain (CARDIA)    Difficulty of Paying Living Expenses: Not very hard  Food Insecurity: No Food Insecurity (08/29/2023)   Hunger Vital Sign    Worried About Running Out of Food in the Last Year: Never true    Ran Out of Food in the Last Year: Never true  Transportation Needs: No Transportation Needs (08/29/2023)   PRAPARE - Administrator, Civil Service (Medical): No    Lack of Transportation (Non-Medical): No  Physical Activity: Sufficiently Active (08/29/2023)   Exercise Vital Sign    Days of Exercise per Week: 4 days    Minutes of Exercise per Session: 90 min  Stress: No Stress Concern Present (08/29/2023)   [employer] of Occupational Health - Occupational  Stress Questionnaire    Feeling of Stress : Not at all  Social Connections: Socially Integrated (08/29/2023)   Social Connection and Isolation Panel [NHANES]    Frequency of Communication with Friends and Family: Three times a week    Frequency of Social Gatherings with Friends and Family: Twice a week    Attends Religious Services: More than 4 times per year    Active Member of [employer] or Organizations: Yes    Attends Engineer, Structural: More than 4 times per year    Marital Status: Married   Allergies  Allergen Reactions   Codeine Nausea Only   Prozac  [Fluoxetine  Hcl] Anxiety    Increased anxiety   Family History  Problem Relation Age of Onset   Depression Father    Heart disease Father        MI in 76s   Prostate cancer Father    OCD Father    Depression Mother    Arthritis Mother    Stroke Mother 30   Parkinsonism Mother    Diabetes Mother    Depression Sister        OCD   Prostate cancer Paternal Grandfather    Diabetes Maternal Grandfather        MI in 55s   Colon cancer Neg Hx    Colon polyps Neg Hx    Esophageal cancer Neg Hx    Rectal cancer Neg Hx    Stomach cancer Neg Hx     Current Outpatient Medications (Endocrine & Metabolic):    JARDIANCE  25 MG TABS tablet, TAKE 1 TABLET BY MOUTH EVERY DAY BEFORE BREAKFAST   levothyroxine  (SYNTHROID ) 75 MCG tablet, TAKE 1 TABLET BY MOUTH EVERY DAY   metFORMIN  (GLUCOPHAGE -XR) 750 MG 24 hr tablet, TAKE 1 TABLET BY MOUTH TWICE A DAY   OZEMPIC , 0.25 OR 0.5 MG/DOSE, 2 MG/3ML SOPN, INJECT 0.5 MG INTO THE SKIN ONE TIME PER WEEK  Current Outpatient Medications (Cardiovascular):    EPIPEN  2-PAK 0.3 MG/0.3ML SOAJ injection, USE AS DIRECTED AS NEEDED   rosuvastatin  (CRESTOR ) 10 MG tablet, TAKE 1 TABLET BY MOUTH EVERY DAY     Current Outpatient Medications (Other):    Ascorbic Acid (VITAMIN C) 250 MG tablet, Take 250 mg by mouth daily.   b complex vitamins capsule, Take 1 capsule by mouth daily.   CHOLECALCIFEROL PO,  Take 1 tablet by mouth daily.   co-enzyme Q-10 30 MG capsule, Take 30 mg by mouth daily.   Continuous Glucose Receiver (FREESTYLE LIBRE 3 READER)  DEVI, 1 each by Does not apply route daily at 2 PM.   Continuous Glucose Sensor (FREESTYLE LIBRE 3 SENSOR) MISC, Use as directed to check blood sugar daily   glucose blood (ONETOUCH VERIO) test strip, Use as instructed once a day   KRILL OIL PO, Take 1 capsule by mouth daily.   MAGNESIUM PO, Take 1 tablet by mouth daily.   Omega-3 Fatty Acids (FISH OIL) 1000 MG CAPS, Take by mouth.   OneTouch Delica Lancets 33G MISC, Use as directed once a day   PRESCRIPTION MEDICATION, Inject 1 each into the skin every 7 (seven) days. Patient gets an allergy shot once a week at [employer] off of [address]   Probiotic Product (PROBIOTIC DAILY PO), Take by mouth daily.   Reviewed prior external information including notes and imaging from  primary care provider As well as notes that were available from care everywhere and other healthcare systems.  Past medical history, social, surgical and family history all reviewed in electronic medical record.  No pertanent information unless stated regarding to the chief complaint.   Review of Systems:  No headache, visual changes, nausea, vomiting, diarrhea, constipation, dizziness, abdominal pain, skin rash, fevers, chills, night sweats, weight loss, swollen lymph nodes, joint swelling, chest pain, shortness of breath, mood changes. POSITIVE muscle aches, body aches  Objective  Blood pressure 122/82, pulse (!) 59, height 6' 3 (1.905 m), weight 205 lb (93 kg), SpO2 98%.   General: No apparent distress alert and oriented x3 mood and affect normal, dressed appropriately.  HEENT: Pupils equal, extraocular movements intact  Respiratory: Patient's speak in full sentences and does not appear short of breath  Cardiovascular: No lower extremity edema, non tender, no erythema  Hip exam shows 0 degrees of internal rotation and only  15 degrees of external rotation.  Negative straight leg test noted though today.  Still has relatively great strength with 4+ out of 5 strength with hip flexion as well as plantar and dorsiflexion of the feet bilaterally.    Impression and Recommendations:    The above documentation has been reviewed and is accurate and complete Levent Kornegay M Melah Ebling, DO

## 2023-09-01 ENCOUNTER — Ambulatory Visit (INDEPENDENT_AMBULATORY_CARE_PROVIDER_SITE_OTHER): Payer: PPO

## 2023-09-01 ENCOUNTER — Ambulatory Visit: Payer: PPO | Admitting: Family Medicine

## 2023-09-01 ENCOUNTER — Encounter: Payer: Self-pay | Admitting: Family Medicine

## 2023-09-01 VITALS — BP 122/82 | HR 59 | Ht 75.0 in | Wt 205.0 lb

## 2023-09-01 DIAGNOSIS — M1611 Unilateral primary osteoarthritis, right hip: Secondary | ICD-10-CM | POA: Insufficient documentation

## 2023-09-01 DIAGNOSIS — M47817 Spondylosis without myelopathy or radiculopathy, lumbosacral region: Secondary | ICD-10-CM | POA: Diagnosis not present

## 2023-09-01 DIAGNOSIS — M48061 Spinal stenosis, lumbar region without neurogenic claudication: Secondary | ICD-10-CM | POA: Diagnosis not present

## 2023-09-01 DIAGNOSIS — M25552 Pain in left hip: Secondary | ICD-10-CM

## 2023-09-01 DIAGNOSIS — M25551 Pain in right hip: Secondary | ICD-10-CM | POA: Diagnosis not present

## 2023-09-01 DIAGNOSIS — M160 Bilateral primary osteoarthritis of hip: Secondary | ICD-10-CM | POA: Diagnosis not present

## 2023-09-01 DIAGNOSIS — M545 Low back pain, unspecified: Secondary | ICD-10-CM

## 2023-09-01 DIAGNOSIS — M4807 Spinal stenosis, lumbosacral region: Secondary | ICD-10-CM | POA: Diagnosis not present

## 2023-09-01 DIAGNOSIS — M47816 Spondylosis without myelopathy or radiculopathy, lumbar region: Secondary | ICD-10-CM | POA: Diagnosis not present

## 2023-09-01 NOTE — Patient Instructions (Addendum)
 Referral [facility] today See us  when you need us 

## 2023-09-01 NOTE — Assessment & Plan Note (Addendum)
 Severe right hip arthritis noted.  Significant loss of lordosis of the back as well to compensate for it.  Good strength though.  Patient did have a transverse myelitis that likely did cause this asymmetry and worsening arthritis of the hip.  Discussed different treatment options and did discuss that probably surgical intervention would be the most beneficial.  Will refer to orthopedic surgery to discuss further.  Follow-up with me as needed  Difficult aspect will be patient has been diagnosed with prostate cancer recently.  He is to be seeing oncology in the near future.

## 2023-09-05 DIAGNOSIS — J3089 Other allergic rhinitis: Secondary | ICD-10-CM | POA: Diagnosis not present

## 2023-09-05 DIAGNOSIS — J301 Allergic rhinitis due to pollen: Secondary | ICD-10-CM | POA: Diagnosis not present

## 2023-09-05 DIAGNOSIS — J3081 Allergic rhinitis due to animal (cat) (dog) hair and dander: Secondary | ICD-10-CM | POA: Diagnosis not present

## 2023-09-12 ENCOUNTER — Encounter (HOSPITAL_COMMUNITY)
Admission: RE | Admit: 2023-09-12 | Discharge: 2023-09-12 | Disposition: A | Discharge status: Home / Self Care | Payer: PPO | Source: Ambulatory Visit | Attending: Urology | Admitting: Urology

## 2023-09-12 DIAGNOSIS — C61 Malignant neoplasm of prostate: Secondary | ICD-10-CM | POA: Diagnosis not present

## 2023-09-12 DIAGNOSIS — J3089 Other allergic rhinitis: Secondary | ICD-10-CM | POA: Diagnosis not present

## 2023-09-12 DIAGNOSIS — J3081 Allergic rhinitis due to animal (cat) (dog) hair and dander: Secondary | ICD-10-CM | POA: Diagnosis not present

## 2023-09-12 DIAGNOSIS — J301 Allergic rhinitis due to pollen: Secondary | ICD-10-CM | POA: Diagnosis not present

## 2023-09-12 MED ORDER — FLOTUFOLASTAT F 18 GALLIUM 296-5846 MBQ/ML IV SOLN
8.0000 | Freq: Once | INTRAVENOUS | Status: AC
  Administered 2023-09-12: 8.12 via INTRAVENOUS
  Filled 2023-09-12: qty 8

## 2023-09-12 NOTE — Progress Notes (Signed)
 GU Location of Tumor / Histology: Prostate Ca  If Prostate Cancer, Gleason Score is (4 + 3) and PSA is (4.30 on 06/09/2023)  PSA 4.26 on 03/16/2023 PSA 5.37 on 02/15/2023 PSA 3.04 on 09/05/2022 PSA 4.12 on 07/26/2022  Aaron Bradshaw presented as referral from Dr. Berniece Salines [facility]) for elevated PSA.  Biopsies     09/12/2023 Dr. Berniece Salines NM PET (PSMA) Skull to Mis Thigh CLINICAL DATA:  Prostate cancer. Recent biopsy. High risk staging.  IMPRESSION: 1. Focal activity in the posterior RIGHT lobe of the prostate gland consistent with primary prostate adenocarcinoma. 2. No evidence of metastatic adenopathy in the pelvis or periaortic retroperitoneum. 3. Intense radiotracer activity associated with a thoracic spine osteophyte complex. Atypical finding for prostate cancer metastasis or degenerative osteophytosis. Recommend correlation with PSA and histopathology. Favor benign lesion. Similar focus of activity along the L5 vertebral body.     Past/Anticipated interventions by urology, if any:  Dr. Berniece Salines    Past/Anticipated interventions by medical oncology, if any:  NA  Weight changes, if any: No  IPSS:  11 SHIM:  24  Bowel/Bladder complaints, if any:  Constipation related to diabetes medication per patient.  No bladder issues at this time.  Nausea/Vomiting, if any: No  Pain issues, if any:  right hip 2/10  SAFETY ISSUES: Prior radiation? No Pacemaker/ICD? No Possible current pregnancy? Male Is the patient on methotrexate? No  Current Complaints / other details:  Needs surgical clearance for right hip.

## 2023-09-13 DIAGNOSIS — M25551 Pain in right hip: Secondary | ICD-10-CM | POA: Diagnosis not present

## 2023-09-15 ENCOUNTER — Encounter: Payer: Self-pay | Admitting: Family Medicine

## 2023-09-15 ENCOUNTER — Ambulatory Visit (INDEPENDENT_AMBULATORY_CARE_PROVIDER_SITE_OTHER): Payer: PPO | Admitting: Family Medicine

## 2023-09-15 DIAGNOSIS — E1165 Type 2 diabetes mellitus with hyperglycemia: Secondary | ICD-10-CM

## 2023-09-15 LAB — HEMOGLOBIN A1C: Hgb A1c MFr Bld: 9.4 % — ABNORMAL HIGH (ref 4.6–6.5)

## 2023-09-18 ENCOUNTER — Other Ambulatory Visit: Payer: Self-pay | Admitting: Family Medicine

## 2023-09-18 NOTE — Progress Notes (Signed)
 Radiation Oncology         [phone] ________________________________  Initial Outpatient Consultation  Name: Aaron Bradshaw MRN: [id number]  Date: 09/19/2023  DOB: February 26, 1951  NG:[id number], Aaron Fortis, MD  Aaron Fat, MD   REFERRING PHYSICIAN: Crist Fat, MD  DIAGNOSIS: 73 y.o. gentleman with Stage T1c adenocarcinoma of the prostate with Gleason score of 4+3, and PSA of 4.3.  No diagnosis found.  HISTORY OF PRESENT ILLNESS: Aaron Bradshaw is a 73 y.o. male with a diagnosis of prostate cancer. He has been followed by [facility], previously Dr. Patsi Bradshaw and currently Dr. Marlou Bradshaw, for many years for hypogonadism, as well as a history of prostatitis. His PSA has been fluctuating over the past year but has remained above 4, with most recent level 4.3 in 05/2023. The patient proceeded to transrectal ultrasound with 12 biopsies of the prostate on 08/21/23.  The prostate volume measured 50 cc.  Out of 12 core biopsies, 5 were positive.  The maximum Gleason score was 4+3, and this was seen in right mid lateral and right base lateral. Additionally, Gleason 3+4 was seen in right base (small focus) and left mid lateral, and a small focus of Gleason 3+3 in left base lateral.  He underwent staging PSMA PET scan on 09/12/23.***  The patient reviewed the biopsy results with his urologist and he has kindly been referred today for discussion of potential radiation treatment options.   PREVIOUS RADIATION THERAPY: No  PAST MEDICAL HISTORY:  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    Heart murmur    'when I was younger"   History of chest pain    Hospitalized ER in [place] for CP and Palpitations with Neg Enzymes   Hyperlipidemia    Shingles    2015   Testosterone deficiency    Dr  Aaron Bradshaw   Thyroid disease    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      PAST SURGICAL HISTORY: Past Surgical History:  Procedure Laterality Date   APPENDECTOMY     CHOLECYSTECTOMY  2010   COLONOSCOPY     Tics, Polyp 12/26/2003; polyps 2010, Dr Aaron Bradshaw   KNEE ARTHROSCOPY     Left   LAPAROSCOPIC APPENDECTOMY N/A 10/05/2015   Procedure: APPENDECTOMY LAPAROSCOPIC;  Surgeon: Aaron Norman, MD;  Location: [facility] OR;  Service: General;  Laterality: N/A;   NOSE SURGERY     Dr  Aaron Bradshaw   POLYPECTOMY     ROTATOR CUFF REPAIR Bilateral    left   SHOULDER ARTHROSCOPY      FAMILY HISTORY:  Family History  Problem Relation Age of Onset   Depression Father    Heart disease Father        MI in 69s   Prostate cancer Father    OCD Father    Depression Mother    Arthritis Mother    Stroke Mother 77   Parkinsonism Mother    Diabetes Mother    Depression Sister        OCD   Prostate cancer Paternal Grandfather    Diabetes Maternal Grandfather        MI in 42s   Colon cancer Neg Hx  Colon polyps Neg Hx    Esophageal cancer Neg Hx    Rectal cancer Neg Hx    Stomach cancer Neg Hx     SOCIAL HISTORY:  Social History   Socioeconomic History   Marital status: Married    Spouse name: Not on file   Number of children: Not on file   Years of education: Not on file   Highest education level: Bachelor's degree (e.g., BA, AB, BS)  Occupational History   Not on file  Tobacco Use   Smoking status: Never   Smokeless tobacco: Never  Vaping Use   Vaping status: Never Used  Substance and Sexual Activity   Alcohol use: Yes    Alcohol/week: 1.0 - 2.0 standard drink of alcohol    Types: 1 - 2 Standard drinks or equivalent per week    Comment: beer occ stopped wine   Drug use: No   Sexual activity: Not on file  Other Topics Concern   Not on file  Social History Narrative   Not on file   Social Drivers of Health   Financial Resource Strain: Low Risk  (08/29/2023)   Overall Financial Resource  Strain (CARDIA)    Difficulty of Paying Living Expenses: Not very hard  Food Insecurity: No Food Insecurity (08/29/2023)   Hunger Vital Sign    Worried About Running Out of Food in the Last Year: Never true    Ran Out of Food in the Last Year: Never true  Transportation Needs: No Transportation Needs (08/29/2023)   PRAPARE - Administrator, Civil Service (Medical): No    Lack of Transportation (Non-Medical): No  Physical Activity: Sufficiently Active (08/29/2023)   Exercise Vital Sign    Days of Exercise per Week: 4 days    Minutes of Exercise per Session: 90 min  Stress: No Stress Concern Present (08/29/2023)   Aaron Bradshaw of Occupational Health - Occupational Stress Questionnaire    Feeling of Stress : Not at all  Social Connections: Socially Integrated (08/29/2023)   Social Connection and Isolation Panel [NHANES]    Frequency of Communication with Friends and Family: Three times a week    Frequency of Social Gatherings with Friends and Family: Twice a week    Attends Religious Services: More than 4 times per year    Active Member of [employer] or Organizations: Yes    Attends Engineer, structural: More than 4 times per year    Marital Status: Married  Catering manager Violence: Not At Risk (07/21/2023)   Humiliation, Afraid, Rape, and Kick questionnaire    Fear of Current or Ex-Partner: No    Emotionally Abused: No    Physically Abused: No    Sexually Abused: No    ALLERGIES: Codeine and Prozac [fluoxetine hcl]  MEDICATIONS:  Current Outpatient Medications  Medication Sig Dispense Refill   Ascorbic Acid (VITAMIN C) 250 MG tablet Take 250 mg by mouth daily.     b complex vitamins capsule Take 1 capsule by mouth daily.     CHOLECALCIFEROL PO Take 1 tablet by mouth daily.     co-enzyme Q-10 30 MG capsule Take 30 mg by mouth daily.     Continuous Glucose Receiver (FREESTYLE LIBRE 3 READER) DEVI 1 each by Does not apply route daily at 2 PM. 2 each 1   Continuous  Glucose Sensor (FREESTYLE LIBRE 3 SENSOR) MISC Use as directed to check blood sugar daily 2 each 1   EPIPEN 2-PAK 0.3 MG/0.3ML SOAJ injection  USE AS DIRECTED AS NEEDED  1   glucose blood (ONETOUCH VERIO) test strip Use as instructed once a day 100 each 3   JARDIANCE 25 MG TABS tablet TAKE 1 TABLET BY MOUTH EVERY DAY BEFORE BREAKFAST 30 tablet 5   KRILL OIL PO Take 1 capsule by mouth daily.     levothyroxine (SYNTHROID) 75 MCG tablet TAKE 1 TABLET BY MOUTH EVERY DAY 90 tablet 1   MAGNESIUM PO Take 1 tablet by mouth daily.     metFORMIN (GLUCOPHAGE-XR) 750 MG 24 hr tablet TAKE 1 TABLET BY MOUTH TWICE A DAY 180 tablet 1   Omega-3 Fatty Acids (FISH OIL) 1000 MG CAPS Take by mouth.     OneTouch Delica Lancets 33G MISC Use as directed once a day 100 each 3   OZEMPIC, 0.25 OR 0.5 MG/DOSE, 2 MG/3ML SOPN INJECT 0.5 MG INTO THE SKIN ONE TIME PER WEEK 3 mL 2   PRESCRIPTION MEDICATION Inject 1 each into the skin every 7 (seven) days. Patient gets an allergy shot once a week at [employer] off of [address]     Probiotic Product (PROBIOTIC DAILY PO) Take by mouth daily.     rosuvastatin (CRESTOR) 10 MG tablet TAKE 1 TABLET BY MOUTH EVERY DAY 90 tablet 1   No current facility-administered medications for this encounter.    REVIEW OF SYSTEMS:  On review of systems, the patient reports that he is doing well overall. He denies any chest pain, shortness of breath, cough, fevers, chills, night sweats, unintended weight changes. He denies any bowel disturbances, and denies abdominal pain, nausea or vomiting. He denies any new musculoskeletal or joint aches or pains. His IPSS was ***, indicating *** urinary symptoms. His SHIM was ***, indicating he {does not have/has mild/moderate/severe} erectile dysfunction. A complete review of systems is obtained and is otherwise negative.    PHYSICAL EXAM:  Wt Readings from Last 3 Encounters:  09/01/23 205 lb (93 kg)  08/30/23 207 lb 14.4 oz (94.3 kg)  07/21/23 203 lb 8 oz  (92.3 kg)   Temp Readings from Last 3 Encounters:  08/30/23 97.8 F (36.6 C) (Oral)  07/21/23 98 F (36.7 C) (Oral)  06/16/23 97.7 F (36.5 C) (Oral)   BP Readings from Last 3 Encounters:  09/01/23 122/82  08/30/23 130/64  07/21/23 120/60   Pulse Readings from Last 3 Encounters:  09/01/23 (!) 59  08/30/23 63  06/16/23 65    /10  In general this is a well appearing *** male in no acute distress. He's alert and oriented x4 and appropriate throughout the examination. Cardiopulmonary assessment is negative for acute distress, and he exhibits normal effort.     KPS = ***  100 - Normal; no complaints; no evidence of disease. 90   - Able to carry on normal activity; minor signs or symptoms of disease. 80   - Normal activity with effort; some signs or symptoms of disease. 51   - Cares for self; unable to carry on normal activity or to do active work. 60   - Requires occasional assistance, but is able to care for most of his personal needs. 50   - Requires considerable assistance and frequent medical care. 40   - Disabled; requires special care and assistance. 30   - Severely disabled; hospital admission is indicated although death not imminent. 20   - Very sick; hospital admission necessary; active supportive treatment necessary. 10   - Moribund; fatal processes progressing rapidly. 0     -  Dead  Karnofsky DA, Abelmann WH, Craver LS and [facility] [phone]) The use of the nitrogen mustards in the palliative treatment of carcinoma: with particular reference to bronchogenic carcinoma Cancer 1 634-56  LABORATORY DATA:  Lab Results  Component Value Date   WBC 7.5 03/27/2018   HGB 15.8 03/27/2018   HCT 44.7 03/27/2018   MCV 87.2 03/27/2018   PLT 193.0 03/27/2018   Lab Results  Component Value Date   NA 138 02/15/2023   K 3.9 02/15/2023   CL 99 02/15/2023   CO2 28 02/15/2023   Lab Results  Component Value Date   ALT 19 02/15/2023   AST 19 02/15/2023   ALKPHOS 67  02/15/2023   BILITOT 1.7 (H) 02/15/2023     RADIOGRAPHY: DG HIPS BILAT WITH PELVIS MIN 5 VIEWS Result Date: 09/14/2023 CLINICAL DATA:  Bilateral hip pain. Low back and bilateral hip pain for 9 years, gradually getting worse. History of transverse myelitis. EXAM: DG HIP (WITH OR WITHOUT PELVIS) 5+V BILAT COMPARISON:  None Available. FINDINGS: There is no evidence of hip acute fracture or dislocation. Moderate-to-severe degenerative changes are noted at the right hip. There are mild degenerative changes at the left hip. Degenerative changes are present in the lower lumbar spine. IMPRESSION: 1. Moderate-to-severe degenerative changes at the right hip. 2. Mild degenerative changes at the left hip. Electronically Signed   By: Thornell Sartorius M.D.   On: 09/14/2023 19:48   DG Lumbar Spine 2-3 Views Result Date: 09/14/2023 CLINICAL DATA:  Low back pain. EXAM: LUMBAR SPINE - 2-3 VIEW COMPARISON:  MRI of the lumbosacral spine November 09 2013 FINDINGS: There is no evidence of lumbar spine fracture.  Alignment is normal. Moderate osteoarthritic changes in the lower lumbosacral spine at L2-L3, L3-L4, L4-L5 and L5-S1, with disc space narrowing, endplate sclerosis and osteophyte formation. Associated posterior facet arthropathy. 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 Electronically Signed   By: Ted Mcalpine M.D.   On: 09/14/2023 16:15      IMPRESSION/PLAN: 1. 73 y.o. gentleman with Stage T1c adenocarcinoma of the prostate with Gleason Score of 4+3, and PSA of 4.3. We discussed the patient's workup and outlined the nature of prostate cancer in this setting. The patient's T stage, Gleason's score, and PSA put him into the intermediate risk group. Accordingly, he is eligible for a variety of potential treatment options including brachytherapy, 5.5 weeks of external radiation, or prostatectomy. We discussed the available radiation techniques, and focused on the details and logistics  of delivery. We discussed and outlined the risks, benefits, short and long-term effects associated with radiotherapy and compared and contrasted these with prostatectomy. We discussed the role of SpaceOAR gel in reducing the rectal toxicity associated with radiotherapy.  He appears to have a good understanding of his disease and our treatment recommendations which are of curative intent.  He was encouraged to ask questions that were answered to his stated satisfaction.  At the conclusion of our conversation, the patient is interested in moving forward with ***.  We personally spent *** minutes in this encounter including chart review, reviewing radiological studies, meeting face-to-face with the patient, entering orders and completing documentation.    Marguarite Arbour, PA-C    Margaretmary Dys, MD  [facility]  Radiation Oncology Direct Dial: [phone]  Fax: [phone] [email or web address]  Skype  LinkedIn   This document serves as a record of services personally performed by Margaretmary Dys, MD and Aaron Fennel, PA-C. It was created on their behalf  by Mickie Bail, a trained medical scribe. The creation of this record is based on the scribe's personal observations and the provider's statements to them. This document has been checked and approved by the attending provider.

## 2023-09-18 NOTE — Progress Notes (Signed)
 STAT read request placed for PSMA PET for upcoming consult on 2/25.

## 2023-09-18 NOTE — Progress Notes (Signed)
 Not seen in office.   Here for lab only  Kristian Covey MD [facility]

## 2023-09-19 ENCOUNTER — Encounter: Payer: Self-pay | Admitting: Radiation Oncology

## 2023-09-19 ENCOUNTER — Ambulatory Visit
Admission: RE | Admit: 2023-09-19 | Discharge: 2023-09-19 | Disposition: A | Discharge status: Home / Self Care | Payer: PPO | Source: Ambulatory Visit | Attending: Radiation Oncology | Admitting: Radiation Oncology

## 2023-09-19 VITALS — BP 145/69 | HR 60 | Temp 97.3°F | Resp 18 | Ht 75.0 in | Wt 201.0 lb

## 2023-09-19 DIAGNOSIS — Z79899 Other long term (current) drug therapy: Secondary | ICD-10-CM | POA: Diagnosis not present

## 2023-09-19 DIAGNOSIS — C61 Malignant neoplasm of prostate: Secondary | ICD-10-CM | POA: Diagnosis not present

## 2023-09-19 DIAGNOSIS — E785 Hyperlipidemia, unspecified: Secondary | ICD-10-CM | POA: Insufficient documentation

## 2023-09-19 DIAGNOSIS — E119 Type 2 diabetes mellitus without complications: Secondary | ICD-10-CM | POA: Insufficient documentation

## 2023-09-19 DIAGNOSIS — Z191 Hormone sensitive malignancy status: Secondary | ICD-10-CM | POA: Diagnosis not present

## 2023-09-19 DIAGNOSIS — M25552 Pain in left hip: Secondary | ICD-10-CM | POA: Diagnosis not present

## 2023-09-19 DIAGNOSIS — Z79890 Hormone replacement therapy (postmenopausal): Secondary | ICD-10-CM | POA: Insufficient documentation

## 2023-09-19 DIAGNOSIS — Z7985 Long-term (current) use of injectable non-insulin antidiabetic drugs: Secondary | ICD-10-CM | POA: Diagnosis not present

## 2023-09-19 DIAGNOSIS — Z9049 Acquired absence of other specified parts of digestive tract: Secondary | ICD-10-CM | POA: Diagnosis not present

## 2023-09-19 DIAGNOSIS — M25551 Pain in right hip: Secondary | ICD-10-CM | POA: Insufficient documentation

## 2023-09-19 DIAGNOSIS — Z7984 Long term (current) use of oral hypoglycemic drugs: Secondary | ICD-10-CM | POA: Insufficient documentation

## 2023-09-19 DIAGNOSIS — M2578 Osteophyte, vertebrae: Secondary | ICD-10-CM | POA: Diagnosis not present

## 2023-09-19 DIAGNOSIS — G373 Acute transverse myelitis in demyelinating disease of central nervous system: Secondary | ICD-10-CM | POA: Diagnosis not present

## 2023-09-19 DIAGNOSIS — R011 Cardiac murmur, unspecified: Secondary | ICD-10-CM | POA: Insufficient documentation

## 2023-09-19 NOTE — Progress Notes (Signed)
 Introduced myself to the patient, and his wife Erskine Squibb, as the prostate nurse navigator.  No barriers to care identified at this time.  He is here to discuss his radiation treatment options.  I gave him my business card and asked him to call me with questions or concerns.  Verbalized understanding.

## 2023-09-22 DIAGNOSIS — J3089 Other allergic rhinitis: Secondary | ICD-10-CM | POA: Diagnosis not present

## 2023-09-22 DIAGNOSIS — J3081 Allergic rhinitis due to animal (cat) (dog) hair and dander: Secondary | ICD-10-CM | POA: Diagnosis not present

## 2023-09-22 DIAGNOSIS — J301 Allergic rhinitis due to pollen: Secondary | ICD-10-CM | POA: Diagnosis not present

## 2023-09-28 DIAGNOSIS — J3089 Other allergic rhinitis: Secondary | ICD-10-CM | POA: Diagnosis not present

## 2023-09-28 DIAGNOSIS — J301 Allergic rhinitis due to pollen: Secondary | ICD-10-CM | POA: Diagnosis not present

## 2023-09-28 DIAGNOSIS — J3081 Allergic rhinitis due to animal (cat) (dog) hair and dander: Secondary | ICD-10-CM | POA: Diagnosis not present

## 2023-10-03 DIAGNOSIS — C61 Malignant neoplasm of prostate: Secondary | ICD-10-CM | POA: Diagnosis not present

## 2023-10-05 DIAGNOSIS — J301 Allergic rhinitis due to pollen: Secondary | ICD-10-CM | POA: Diagnosis not present

## 2023-10-05 DIAGNOSIS — J3089 Other allergic rhinitis: Secondary | ICD-10-CM | POA: Diagnosis not present

## 2023-10-05 DIAGNOSIS — J3081 Allergic rhinitis due to animal (cat) (dog) hair and dander: Secondary | ICD-10-CM | POA: Diagnosis not present

## 2023-10-13 ENCOUNTER — Other Ambulatory Visit: Payer: Self-pay | Admitting: Urology

## 2023-10-17 ENCOUNTER — Ambulatory Visit (INDEPENDENT_AMBULATORY_CARE_PROVIDER_SITE_OTHER): Admitting: Family Medicine

## 2023-10-17 ENCOUNTER — Encounter: Payer: Self-pay | Admitting: Family Medicine

## 2023-10-17 VITALS — BP 120/74 | HR 58 | Temp 97.8°F | Ht 75.0 in | Wt 197.5 lb

## 2023-10-17 DIAGNOSIS — Z7985 Long-term (current) use of injectable non-insulin antidiabetic drugs: Secondary | ICD-10-CM | POA: Diagnosis not present

## 2023-10-17 DIAGNOSIS — E785 Hyperlipidemia, unspecified: Secondary | ICD-10-CM | POA: Diagnosis not present

## 2023-10-17 DIAGNOSIS — E039 Hypothyroidism, unspecified: Secondary | ICD-10-CM | POA: Diagnosis not present

## 2023-10-17 DIAGNOSIS — E1165 Type 2 diabetes mellitus with hyperglycemia: Secondary | ICD-10-CM | POA: Diagnosis not present

## 2023-10-17 LAB — POCT GLYCOSYLATED HEMOGLOBIN (HGB A1C): Hemoglobin A1C: 7.8 % — AB (ref 4.0–5.6)

## 2023-10-17 NOTE — Progress Notes (Unsigned)
 Established Patient Office Visit  Subjective   Patient ID: ELLEN MAYOL, male    DOB: [birth year]  Age: 73 y.o. MRN: [id number]  Chief Complaint  Patient presents with   Diabetes    A1c check    HPI  {History (Optional):23778} [name] is here today requesting A1c check.  He has longstanding history of type 2 diabetes and recent poor control with last A1c just lower month ago 9.4.  He had quit taking Ozempic and dietary compliance had fallen off somewhat.  He was told of course that he cannot get the hip surgery until his A1c came down.  He started back Ozempic a month ago.  He had recent dietary counseling and has been following some suggestions there and also trying to exercise with light exercise for about 20 minutes after dinner each evening that he feels like all these things are paying off.  Has previously been prescribed continuous glucose monitor but quit using this several weeks ago.  He hopes to get right hip replacement surgery soon.  His osteoarthritis is greatly limited his activities.  He also has prostate cancer which has been followed for some time by urology and he is planning to get external radiation started soon  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   Diabetes mellitus (HCC) 12/2010   A1c 7.1 %   dx 4 yrs ago   GERD (gastroesophageal reflux disease)    Gilbert syndrome    Heart murmur    'when I was younger"   History of chest pain    Hospitalized ER in [place] for CP and Palpitations with Neg Enzymes   Hyperlipidemia    Shingles    2015   Testosterone deficiency    Dr Marcello Fennel   Thyroid disease    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 He has never used smokeless tobacco. He reports current alcohol use of about 1.0 - 2.0 standard drink of alcohol per week. He reports that he does not use drugs. family history includes Arthritis in his mother; Cancer in his father; Depression in his father, mother, and sister; Diabetes in his maternal grandfather and mother; Heart disease in his father; OCD in his father; Parkinsonism in his mother; Prostate cancer in his father and paternal grandfather; Stroke (age of onset: 12) in his mother. Allergies  Allergen Reactions   Codeine Nausea Only   Prozac [Fluoxetine Hcl] Anxiety    Increased anxiety    Review of Systems  Constitutional:  Negative for chills, fever and malaise/fatigue.  Eyes:  Negative for blurred vision.  Respiratory:  Negative for shortness of breath.   Cardiovascular:  Negative for chest  pain.  Neurological:  Negative for dizziness, weakness and headaches.      Objective:     BP 120/74 (BP Location: Left Arm, Patient Position: Sitting, Cuff Size: Normal)   Pulse (!) 58   Temp 97.8 F (36.6 C) (Oral)   Ht 6\' 3"  (1.905 m)   Wt 197 lb 8 oz (89.6 kg)   SpO2 97%   BMI 24.69 kg/m  BP Readings from Last 3 Encounters:  10/17/23 120/74  09/19/23 (!) 145/69  09/01/23 122/82   Wt Readings from Last 3 Encounters:  10/17/23 197 lb 8 oz (89.6 kg)  09/19/23 201 lb (91.2 kg)  09/01/23 205 lb (93 kg)      Physical Exam Vitals reviewed.  Constitutional:      General: He is not in acute distress.    Appearance: He is not ill-appearing.  Cardiovascular:     Rate and Rhythm: Normal rate and regular rhythm.  Pulmonary:     Effort: Pulmonary effort is normal.      Breath sounds: Normal breath sounds.  Neurological:     Mental Status: He is alert.      Results for orders placed or performed in visit on 10/17/23  POC HgB A1c  Result Value Ref Range   Hemoglobin A1C 7.8 (A) 4.0 - 5.6 %   HbA1c POC (<> result, manual entry)     HbA1c, POC (prediabetic range)     HbA1c, POC (controlled diabetic range)      {Labs (Optional):23779}  The ASCVD Risk score (Arnett DK, et al., 2019) failed to calculate for the following reasons:   The valid total cholesterol range is 130 to 320 mg/dL    Assessment & Plan:   Type 2 diabetes with recent poor control with A1c 9.4% a month ago.  He started back Ozempic currently 0.5 mg subcutaneous weekly and is tighten up his diet.  He is also exercising some after his dinner meal.  A1c has gone down tremendously during the past month to 7.8.  -He did have request to consider endocrinology consult and we will proceed with that. -He has seen nutritionist recently and has stopped of his dietary compliance since last visit  No follow-ups on file.    Evelena Peat, MD

## 2023-10-29 ENCOUNTER — Other Ambulatory Visit: Payer: Self-pay | Admitting: Family Medicine

## 2023-10-30 ENCOUNTER — Telehealth: Payer: Self-pay | Admitting: Pharmacist

## 2023-10-30 NOTE — Telephone Encounter (Signed)
 Pharmacy Patient Advocate Encounter   Received notification from Patient Pharmacy that prior authorization for Ozempic (0.25 or 0.5 MG/DOSE) 2MG /3ML pen-injectors is required/requested.   Insurance verification completed.   The patient is insured through [facility] ADVANTAGE/RX ADVANCE .   Per test claim: PA required; PA submitted to above mentioned insurance via CoverMyMeds Key/confirmation #/EOC [id number] Status is pending

## 2023-10-31 DIAGNOSIS — J3081 Allergic rhinitis due to animal (cat) (dog) hair and dander: Secondary | ICD-10-CM | POA: Diagnosis not present

## 2023-10-31 DIAGNOSIS — J301 Allergic rhinitis due to pollen: Secondary | ICD-10-CM | POA: Diagnosis not present

## 2023-10-31 DIAGNOSIS — J3089 Other allergic rhinitis: Secondary | ICD-10-CM | POA: Diagnosis not present

## 2023-11-01 ENCOUNTER — Telehealth: Payer: Self-pay

## 2023-11-01 NOTE — Telephone Encounter (Signed)
 Copied from CRM [id number]. Topic: Clinical - Medication Question >> Nov 01, 2023  2:34 PM Almira Coaster wrote: Reason for CRM: [employer] is calling to verify some information for the Semaglutide,0.25 or 0.5MG /DOS, (OZEMPIC, 0.25 OR 0.5 MG/DOSE,) 2 MG/3ML SOPN. Best call back number [phone] option 2.

## 2023-11-02 ENCOUNTER — Telehealth: Payer: Self-pay | Admitting: *Deleted

## 2023-11-02 ENCOUNTER — Other Ambulatory Visit (HOSPITAL_COMMUNITY): Payer: Self-pay

## 2023-11-02 DIAGNOSIS — E1165 Type 2 diabetes mellitus with hyperglycemia: Secondary | ICD-10-CM

## 2023-11-02 NOTE — Telephone Encounter (Signed)
 Pharmacy Patient Advocate Encounter  Received notification from [facility] ADVANTAGE/RX ADVANCE that Prior Authorization for Ozempic (0.25 or 0.5 MG/DOSE) 2MG /3ML pen-injectors  has been APPROVED from 10/30/23 to 10/29/24. Ran test claim, Copay is $117.50. This test claim was processed through [facility]- copay amounts may vary at other pharmacies due to pharmacy/plan contracts, or as the patient moves through the different stages of their insurance plan.   PA #/Case ID/Reference #: [id number]

## 2023-11-02 NOTE — Telephone Encounter (Signed)
 Copied from CRM [id number]. Topic: Clinical - Request for Lab/Test Order >> Nov 02, 2023  4:51 PM Fredrica W wrote: Reason for CRM: Patient called and states he needs to schedule appt for A1c check to see if it is at a level where he can have his surgery. Would like to come ine Friday if possible. No future order showing. Can an order be put in for patient to come for test. Last test 3/25. Thank You

## 2023-11-03 ENCOUNTER — Other Ambulatory Visit

## 2023-11-03 DIAGNOSIS — E1165 Type 2 diabetes mellitus with hyperglycemia: Secondary | ICD-10-CM | POA: Diagnosis not present

## 2023-11-03 NOTE — Telephone Encounter (Signed)
 Patient aware.

## 2023-11-03 NOTE — Addendum Note (Signed)
 Addended by: Christy Sartorius on: 11/03/2023 03:49 PM   Modules accepted: Orders

## 2023-11-03 NOTE — Telephone Encounter (Signed)
Labs placed and patient aware

## 2023-11-03 NOTE — Telephone Encounter (Signed)
 Please see previous encounter

## 2023-11-04 LAB — HEMOGLOBIN A1C
Hgb A1c MFr Bld: 7.7 %{Hb} — ABNORMAL HIGH (ref <5.7)
Mean Plasma Glucose: 174 mg/dL
eAG (mmol/L): 9.7 mmol/L

## 2023-11-06 ENCOUNTER — Telehealth: Payer: Self-pay

## 2023-11-06 NOTE — Telephone Encounter (Signed)
 Copied from CRM [id number]. Topic: Clinical - Request for Lab/Test Order >> Nov 06, 2023 10:52 AM Ovid Blow wrote: Reason for CRM: Patient is requesting for lab results to be sent to [facility]-Dr. Daldorff

## 2023-11-08 ENCOUNTER — Other Ambulatory Visit: Payer: Self-pay | Admitting: Urology

## 2023-11-08 DIAGNOSIS — C61 Malignant neoplasm of prostate: Secondary | ICD-10-CM

## 2023-11-09 ENCOUNTER — Telehealth: Payer: Self-pay | Admitting: Dietician

## 2023-11-09 NOTE — Telephone Encounter (Signed)
 Patient returning call to make an appointment. Messaged admin pool.  [name], RD, LDN, CDCES, DipACLM

## 2023-11-09 NOTE — Telephone Encounter (Signed)
 Patient returning call for appointment. Will message admin pool.  [name], RD, LDN, CDCES, DipACLM

## 2023-11-09 NOTE — Progress Notes (Signed)
 RN returned patients call regarding question about MRI scheduled for 5/20.  Patient inquiring if those would cause interference with any orthopedic surgery's in future.   RN left detailed message, call back number provided to follow up.

## 2023-11-13 NOTE — Telephone Encounter (Signed)
Results sent via Epic.

## 2023-11-21 ENCOUNTER — Encounter: Payer: Self-pay | Admitting: Family Medicine

## 2023-11-27 ENCOUNTER — Encounter: Payer: Self-pay | Admitting: Family Medicine

## 2023-11-27 ENCOUNTER — Ambulatory Visit (INDEPENDENT_AMBULATORY_CARE_PROVIDER_SITE_OTHER): Payer: PPO | Admitting: Family Medicine

## 2023-11-27 VITALS — BP 120/60 | HR 67 | Temp 97.5°F | Wt 196.7 lb

## 2023-11-27 DIAGNOSIS — Z7984 Long term (current) use of oral hypoglycemic drugs: Secondary | ICD-10-CM

## 2023-11-27 DIAGNOSIS — E1165 Type 2 diabetes mellitus with hyperglycemia: Secondary | ICD-10-CM

## 2023-11-27 DIAGNOSIS — Z7985 Long-term (current) use of injectable non-insulin antidiabetic drugs: Secondary | ICD-10-CM | POA: Diagnosis not present

## 2023-11-27 LAB — POCT GLYCOSYLATED HEMOGLOBIN (HGB A1C): Hemoglobin A1C: 6.9 % — AB (ref 4.0–5.6)

## 2023-11-27 NOTE — Progress Notes (Signed)
 Established Patient Office Visit  Subjective   Patient ID: GOULD SNIDE, male    DOB: 06/09/1951  Age: 73 y.o. MRN: [id number]  Chief Complaint  Patient presents with   Medical Management of Chronic Issues    HPI   Deneise Finlay has history of hypothyroidism, type 2 diabetes, osteoarthritis involving multiple joints, prostate cancer, dyslipidemia.  He is getting ready to start radiation treatment for his prostate cancer in May.  He had plan to get total hip replacement will have to delay that until after getting his radiation treatments.  Recent poorly controlled diabetes.  He had A1c of 9.4% in February and steadily improving since then.  We reinitiated GLP-1 medication with Ozempic  currently 0.5 mg subcutaneous once weekly.  He is taking this in addition to Jardiance  and metformin .  He has been working diligently trying to get down A1c with anticipation of hip replacement.  Last A1c was 7.7%.  He has been trying to keep his weight up and not lose further muscle mass.  Stays very physically active.  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  He has done a better job recently restricting sugars and starches.  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 He has never used smokeless tobacco. He reports current alcohol use of about 1.0 - 2.0 standard drink of alcohol per week. He reports that he does not use drugs. family history includes Arthritis in his mother; Cancer in his father; Depression in his father, mother, and sister; Diabetes in his maternal grandfather and mother; Heart disease in his father; OCD in his father; Parkinsonism in his mother; Prostate cancer in his father and paternal grandfather; Stroke (age of onset: 67) in his mother. Allergies  Allergen Reactions   Codeine Nausea Only   Prozac  [Fluoxetine  Hcl] Anxiety    Increased anxiety    Review of Systems  Constitutional:  Negative for chills and fever.  Respiratory:  Negative for shortness of breath.   Cardiovascular:  Negative for chest pain.      Objective:  BP 120/60 (BP Location: Left Arm, Patient Position: Sitting, Cuff Size: Normal)   Pulse 67   Temp (!) 97.5 F (36.4 C) (Oral)   Wt 196 lb 11.2 oz (89.2 kg)   SpO2 97%   BMI 24.59 kg/m  BP Readings from Last 3 Encounters:  11/27/23 120/60  10/17/23 120/74  09/19/23 (!) 145/69   Wt Readings from Last 3 Encounters:  11/27/23 196 lb 11.2 oz (89.2 kg)  10/17/23 197 lb 8 oz (89.6 kg)  09/19/23 201 lb (91.2 kg)      Physical Exam Vitals reviewed.  Constitutional:      General: He is not in acute distress.    Appearance: He is not ill-appearing.  Cardiovascular:     Rate and Rhythm: Normal rate and regular rhythm.  Pulmonary:     Effort: Pulmonary effort is normal.     Breath sounds: Normal breath sounds. No wheezing or rales.       Results for orders placed or performed in visit on 11/27/23  POC HgB A1c  Result Value Ref Range   Hemoglobin A1C 6.9 (A) 4.0 - 5.6 %   HbA1c POC (<> result, manual entry)     HbA1c, POC (prediabetic range)     HbA1c, POC (controlled diabetic range)        The ASCVD Risk score (Arnett DK, et al., 2019) failed to calculate for the following reasons:   The valid total cholesterol range is 130 to 320 mg/dL    Assessment & Plan:   Problem List Items Addressed This Visit       Unprioritized   Type 2 diabetes mellitus, controlled (HCC) - Primary   Relevant Orders   POC HgB A1c (Completed)  Type 2 diabetes improving with A1c today 6.9%.  He has seen some definite gradual improvement with Ozempic  added to his Jardiance  and metformin .  Continue low glycemic diet and regular exercise habits.  Set up 70-month follow-up.  Will need follow-up labs including lipids, TSH, chemistries at that time.  Return in about 3 months (around 02/27/2024).    Glean Lamy, MD

## 2023-11-27 NOTE — Patient Instructions (Signed)
 A1C today 6.9%

## 2023-11-29 ENCOUNTER — Encounter: Payer: Self-pay | Admitting: Radiation Oncology

## 2023-12-04 ENCOUNTER — Encounter (HOSPITAL_COMMUNITY): Payer: Self-pay | Admitting: Urology

## 2023-12-04 NOTE — Progress Notes (Addendum)
 ADDENDUM: Spoke with patient via telephone to update arrival time. Arrival time now 1020 and patient can have clears until 0920. Verbalized understanding.         Spoke w/ via phone for pre-op interview--- [facility] needs dos---- CBG, BMP per surgeon.        Lab results------Current A1C 6.9 in Epic dated 11/27/23. Current EKG in Epic dated 06/16/23. COVID test -----patient states asymptomatic no test needed Arrive at -------1115 NPO after MN NO Solid Food.  Clear liquids from MN until---1015 Pre-Surgery Ensure or G2:  Med rec completed Medications to take morning of surgery -----Levothyroxine  Diabetic medication -----NONE AM of procedure  GLP1 agonist last dose: Ozempic  last dose 12/01/23. GLP1 instructions: No doses of Ozempic  doses until after procedure, pt verbalized understanding.   Patient instructed no nail polish to be worn day of surgery Patient instructed to bring photo id and insurance card day of surgery Patient aware to have Driver (ride ) / caregiver    for 24 hours after surgery - Wife Rosemary Conrad Patient Special Instructions ----- Shower with antibacterial soap. Fleet enema AM of surgery per surgeons instructions. Pre-Op special Instructions -----  Patient verbalized understanding of instructions that were given at this phone interview. Patient denies chest pain, sob, fever, cough at the interview.

## 2023-12-05 DIAGNOSIS — J3081 Allergic rhinitis due to animal (cat) (dog) hair and dander: Secondary | ICD-10-CM | POA: Diagnosis not present

## 2023-12-05 DIAGNOSIS — J3089 Other allergic rhinitis: Secondary | ICD-10-CM | POA: Diagnosis not present

## 2023-12-05 DIAGNOSIS — J301 Allergic rhinitis due to pollen: Secondary | ICD-10-CM | POA: Diagnosis not present

## 2023-12-08 ENCOUNTER — Ambulatory Visit (HOSPITAL_COMMUNITY)
Admission: RE | Admit: 2023-12-08 | Discharge: 2023-12-08 | Disposition: A | Discharge status: Home / Self Care | Attending: Urology | Admitting: Urology

## 2023-12-08 ENCOUNTER — Encounter (HOSPITAL_COMMUNITY)
Admission: RE | Disposition: A | Discharge status: Home / Self Care | Payer: Self-pay | Source: Home / Self Care | Attending: Urology

## 2023-12-08 ENCOUNTER — Ambulatory Visit (HOSPITAL_COMMUNITY)

## 2023-12-08 ENCOUNTER — Encounter (HOSPITAL_COMMUNITY): Payer: Self-pay | Admitting: Urology

## 2023-12-08 ENCOUNTER — Other Ambulatory Visit: Payer: Self-pay

## 2023-12-08 DIAGNOSIS — C61 Malignant neoplasm of prostate: Secondary | ICD-10-CM | POA: Insufficient documentation

## 2023-12-08 DIAGNOSIS — E119 Type 2 diabetes mellitus without complications: Secondary | ICD-10-CM | POA: Diagnosis not present

## 2023-12-08 DIAGNOSIS — E1165 Type 2 diabetes mellitus with hyperglycemia: Secondary | ICD-10-CM

## 2023-12-08 DIAGNOSIS — K219 Gastro-esophageal reflux disease without esophagitis: Secondary | ICD-10-CM | POA: Diagnosis not present

## 2023-12-08 HISTORY — PX: SPACE OAR INSTILLATION: SHX6769

## 2023-12-08 HISTORY — PX: GOLD SEED IMPLANT: SHX6343

## 2023-12-08 LAB — BASIC METABOLIC PANEL WITH GFR
Anion gap: 12 (ref 5–15)
BUN: 19 mg/dL (ref 8–23)
CO2: 24 mmol/L (ref 22–32)
Calcium: 9.2 mg/dL (ref 8.9–10.3)
Chloride: 103 mmol/L (ref 98–111)
Creatinine, Ser: 0.81 mg/dL (ref 0.61–1.24)
GFR, Estimated: >60 mL/min (ref >60)
Glucose, Bld: 175 mg/dL — ABNORMAL HIGH (ref 70–99)
Potassium: 4.3 mmol/L (ref 3.5–5.1)
Sodium: 139 mmol/L (ref 135–145)

## 2023-12-08 LAB — GLUCOSE, CAPILLARY
Glucose-Capillary: 160 mg/dL — ABNORMAL HIGH (ref 70–99)
Glucose-Capillary: 168 mg/dL — ABNORMAL HIGH (ref 70–99)

## 2023-12-08 SURGERY — INSERTION, GOLD SEEDS
Anesthesia: Monitor Anesthesia Care | Site: Prostate

## 2023-12-08 MED ORDER — OXYCODONE HCL 5 MG/5ML PO SOLN: 5.0000 mg | Freq: Once | ORAL | Status: DC | PRN

## 2023-12-08 MED ORDER — CHLORHEXIDINE GLUCONATE 0.12 % MT SOLN
OROMUCOSAL | Status: AC
  Filled 2023-12-08: qty 15

## 2023-12-08 MED ORDER — LIDOCAINE HCL 2 % IJ SOLN
INTRAMUSCULAR | Status: AC
  Filled 2023-12-08: qty 20

## 2023-12-08 MED ORDER — PROPOFOL 10 MG/ML IV BOLUS
INTRAVENOUS | Status: AC
  Filled 2023-12-08: qty 20

## 2023-12-08 MED ORDER — CEFAZOLIN SODIUM-DEXTROSE 2-4 GM/100ML-% IV SOLN
2.0000 g | INTRAVENOUS | Status: AC
  Administered 2023-12-08: 2 g via INTRAVENOUS

## 2023-12-08 MED ORDER — CHLORHEXIDINE GLUCONATE 0.12 % MT SOLN
15.0000 mL | Freq: Once | OROMUCOSAL | Status: AC
  Administered 2023-12-08: 15 mL via OROMUCOSAL

## 2023-12-08 MED ORDER — LIDOCAINE 2% (20 MG/ML) 5 ML SYRINGE
INTRAMUSCULAR | Status: DC | PRN
  Administered 2023-12-08: 80 mg via INTRAVENOUS

## 2023-12-08 MED ORDER — OXYCODONE HCL 5 MG PO TABS: 5.0000 mg | ORAL_TABLET | Freq: Once | ORAL | Status: DC | PRN

## 2023-12-08 MED ORDER — SODIUM CHLORIDE (PF) 0.9 % IJ SOLN
INTRAMUSCULAR | Status: AC
  Filled 2023-12-08: qty 10

## 2023-12-08 MED ORDER — DROPERIDOL 2.5 MG/ML IJ SOLN: 0.6250 mg | Freq: Once | INTRAMUSCULAR | Status: DC | PRN

## 2023-12-08 MED ORDER — CEFAZOLIN SODIUM-DEXTROSE 2-4 GM/100ML-% IV SOLN
INTRAVENOUS | Status: AC
  Filled 2023-12-08: qty 100

## 2023-12-08 MED ORDER — LACTATED RINGERS IV SOLN
INTRAVENOUS | Status: DC
Start: 2023-12-08 — End: 2023-12-08

## 2023-12-08 MED ORDER — DEXAMETHASONE SODIUM PHOSPHATE 10 MG/ML IJ SOLN
INTRAMUSCULAR | Status: AC
  Filled 2023-12-08: qty 1

## 2023-12-08 MED ORDER — DEXAMETHASONE SODIUM PHOSPHATE 10 MG/ML IJ SOLN
INTRAMUSCULAR | Status: DC | PRN
  Administered 2023-12-08: 4 mg via INTRAVENOUS

## 2023-12-08 MED ORDER — SODIUM CHLORIDE (PF) 0.9 % IJ SOLN
INTRAMUSCULAR | Status: DC | PRN
  Administered 2023-12-08: 10 mL via INTRAVENOUS

## 2023-12-08 MED ORDER — LIDOCAINE 2% (20 MG/ML) 5 ML SYRINGE
INTRAMUSCULAR | Status: AC
  Filled 2023-12-08: qty 5

## 2023-12-08 MED ORDER — FENTANYL CITRATE (PF) 100 MCG/2ML IJ SOLN: 25.0000 ug | INTRAMUSCULAR | Status: DC | PRN

## 2023-12-08 MED ORDER — ONDANSETRON HCL 4 MG/2ML IJ SOLN
INTRAMUSCULAR | Status: AC
  Filled 2023-12-08: qty 2

## 2023-12-08 MED ORDER — ONDANSETRON HCL 4 MG/2ML IJ SOLN
INTRAMUSCULAR | Status: DC | PRN
  Administered 2023-12-08: 4 mg via INTRAVENOUS

## 2023-12-08 MED ORDER — INSULIN ASPART 100 UNIT/ML IJ SOLN: 0.0000 [IU] | INTRAMUSCULAR | Status: DC | PRN

## 2023-12-08 MED ORDER — FLEET ENEMA RE ENEM
1.0000 | ENEMA | Freq: Once | RECTAL | Status: DC
  Filled 2023-12-08: qty 1

## 2023-12-08 MED ORDER — TRAMADOL HCL 50 MG PO TABS: 50.0000 mg | ORAL_TABLET | Freq: Four times a day (QID) | ORAL | 0 refills | Status: DC | PRN

## 2023-12-08 MED ORDER — ORAL CARE MOUTH RINSE: 15.0000 mL | Freq: Once | OROMUCOSAL | Status: AC

## 2023-12-08 MED ORDER — PROPOFOL 10 MG/ML IV BOLUS
INTRAVENOUS | Status: DC | PRN
  Administered 2023-12-08: 100 mg via INTRAVENOUS
  Administered 2023-12-08: 200 mg via INTRAVENOUS

## 2023-12-08 MED ORDER — LIDOCAINE HCL 2 % IJ SOLN
INTRAMUSCULAR | Status: DC | PRN
  Administered 2023-12-08: 10 mL

## 2023-12-08 MED ORDER — MIDAZOLAM HCL 2 MG/2ML IJ SOLN
INTRAMUSCULAR | Status: AC
  Filled 2023-12-08: qty 2

## 2023-12-08 SURGICAL SUPPLY — 20 items
BLADE CLIPPER SENSICLIP SURGIC (BLADE) ×1 IMPLANT
CNTNR URN SCR LID CUP LEK RST (MISCELLANEOUS) ×1 IMPLANT
COVER BACK TABLE 60X90IN (DRAPES) ×1 IMPLANT
DRSG TEGADERM 4X4.75 (GAUZE/BANDAGES/DRESSINGS) IMPLANT
DRSG TEGADERM 8X12 (GAUZE/BANDAGES/DRESSINGS) ×1 IMPLANT
GAUZE SPONGE 4X4 12PLY STRL (GAUZE/BANDAGES/DRESSINGS) IMPLANT
GLOVE BIO SURGEON STRL SZ7.5 (GLOVE) ×1 IMPLANT
GLOVE BIOGEL PI IND STRL 7.0 (GLOVE) IMPLANT
IMPL SPACEOAR SYSTEM 10ML (Spacer) ×1 IMPLANT
MARKER GOLD PRELOAD 1.2X3 (Urological Implant) ×1 IMPLANT
MARKER SKIN DUAL TIP RULER LAB (MISCELLANEOUS) ×1 IMPLANT
NDL SPNL 22GX3.5 QUINCKE BK (NEEDLE) ×1 IMPLANT
NEEDLE SPNL 22GX3.5 QUINCKE BK (NEEDLE) ×1 IMPLANT
SHEATH ULTRASOUND LTX NONSTRL (SHEATH) IMPLANT
SLEEVE SCD COMPRESS KNEE MED (STOCKING) ×1 IMPLANT
SURGILUBE 2OZ TUBE FLIPTOP (MISCELLANEOUS) ×1 IMPLANT
SYR 10ML LL (SYRINGE) IMPLANT
SYR CONTROL 10ML LL (SYRINGE) ×1 IMPLANT
TOWEL OR 17X24 6PK STRL BLUE (TOWEL DISPOSABLE) ×1 IMPLANT
UNDERPAD 30X36 HEAVY ABSORB (UNDERPADS AND DIAPERS) ×1 IMPLANT

## 2023-12-08 NOTE — H&P (Signed)
 Aaron Bradshaw is an 73 y.o. male.    Chief Complaint: Pre-Op Prostate Fiducial Marker / SPACE-OAR  HPI:   1 - Moderate Risk Prostate Cancer - Grade 2 disease on eval PSA 4.3. 50gm. PET localized.  Today "Aaron Bradshaw" is seen to proceed with prostate fiducial marker and SPACE-OAR placement.   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    Past Surgical History:  Procedure Laterality Date   APPENDECTOMY     CHOLECYSTECTOMY  2010   COLONOSCOPY     Tics, Polyp 12/26/2003; polyps 2010, Dr Willy Harvest   EYE SURGERY  retina   KNEE ARTHROSCOPY     Left   LAPAROSCOPIC APPENDECTOMY N/A 10/05/2015   Procedure: APPENDECTOMY LAPAROSCOPIC;  Surgeon: Jerryl Morin, MD;  Location: [facility] OR;  Service: General;  Laterality: N/A;   NOSE SURGERY     Dr  Merlyn Starring   POLYPECTOMY     ROTATOR CUFF REPAIR Bilateral    left   SHOULDER ARTHROSCOPY      Family History  Problem Relation Age of Onset   Depression Father    Heart disease Father        MI in 68s   Prostate cancer Father    OCD Father    Cancer Father    Depression Mother    Arthritis Mother    Stroke Mother 38   Parkinsonism Mother    Diabetes Mother    Depression Sister        OCD   Prostate cancer Paternal Grandfather    Diabetes Maternal Grandfather        MI in 37s   Colon cancer Neg Hx    Colon polyps Neg Hx    Esophageal cancer Neg Hx    Rectal cancer Neg Hx    Stomach cancer Neg Hx    Social History:  reports that he has never  smoked. He has never used smokeless tobacco. He reports current alcohol use of about 1.0 - 2.0 standard drink of alcohol per week. He reports that he does not use drugs.  Allergies:  Allergies  Allergen Reactions   Codeine Nausea Only   Prozac  [Fluoxetine  Hcl] Anxiety    Increased anxiety    No medications prior to admission.    No results found for this or any previous visit (from the past 48 hours). No results found.  Review of Systems  Constitutional:  Negative for chills and fever.  All other systems reviewed and are negative.   There were no vitals taken for this visit. Physical Exam Vitals reviewed.  HENT:     Head: Normocephalic.  Eyes:     Pupils: Pupils are equal, round, and reactive to light.  Cardiovascular:     Rate and Rhythm: Normal rate.  Pulmonary:  Effort: Pulmonary effort is normal.  Abdominal:     General: Abdomen is flat.  Genitourinary:    Comments: No CVAT at present Musculoskeletal:        General: Normal range of motion.  Neurological:     General: No focal deficit present.     Mental Status: He is alert.  Psychiatric:        Mood and Affect: Mood normal.      Assessment/Plan  Proceed as planned with prostate fiducial marker and SPACE-OAR. Risks, benefits, alternatives, expected peri-op course discussed.   Melody Spurling., MD 12/08/2023, 7:03 AM

## 2023-12-08 NOTE — Discharge Instructions (Addendum)
 1 - You may have urinary urgency (bladder spasms) and bloody urine on / off for up to a week. This is normal.  2 - Call MD or go to ER for fever >102, severe pain / nausea / vomiting not relieved by medications, or acute change in medical status        Post Anesthesia Home Care Instructions  Activity: Get plenty of rest for the remainder of the day. A responsible individual must stay with you for 24 hours following the procedure.  For the next 24 hours, DO NOT: -Drive a car -Advertising copywriter -Drink alcoholic beverages -Take any medication unless instructed by your physician -Make any legal decisions or sign important papers.  Meals: Start with liquid foods such as gelatin or soup. Progress to regular foods as tolerated. Avoid greasy, spicy, heavy foods. If nausea and/or vomiting occur, drink only clear liquids until the nausea and/or vomiting subsides. Call your physician if vomiting continues.  Special Instructions/Symptoms: Your throat may feel dry or sore from the anesthesia or the breathing tube placed in your throat during surgery. If this causes discomfort, gargle with warm salt water. The discomfort should disappear within 24 hours.

## 2023-12-08 NOTE — Anesthesia Preprocedure Evaluation (Signed)
 Anesthesia Evaluation  Patient identified by MRN, date of birth, ID band Patient awake    Reviewed: Allergy & Precautions, NPO status , Patient's Chart, lab work & pertinent test results  Airway Mallampati: II  TM Distance: >3 FB Neck ROM: Full    Dental no notable dental hx. (+) Teeth Intact, Dental Advisory Given   Pulmonary neg pulmonary ROS, neg recent URI   Pulmonary exam normal breath sounds clear to auscultation       Cardiovascular Normal cardiovascular exam+ Valvular Problems/Murmurs  Rhythm:Regular Rate:Normal     Neuro/Psych  PSYCHIATRIC DISORDERS Anxiety     negative neurological ROS     GI/Hepatic Neg liver ROS,GERD  Controlled,,  Endo/Other  diabetes, Type 2, Oral Hypoglycemic Agents    Renal/GU negative Renal ROS     Musculoskeletal  (+) Arthritis ,    Abdominal   Peds  Hematology negative hematology ROS (+)   Anesthesia Other Findings   Reproductive/Obstetrics                             Anesthesia Physical Anesthesia Plan  ASA: 2  Anesthesia Plan: MAC   Post-op Pain Management: Minimal or no pain anticipated   Induction: Intravenous  PONV Risk Score and Plan: Ondansetron , Propofol  infusion, TIVA and Treatment may vary due to age or medical condition  Airway Management Planned: Natural Airway  Additional Equipment:   Intra-op Plan:   Post-operative Plan:   Informed Consent: I have reviewed the patients History and Physical, chart, labs and discussed the procedure including the risks, benefits and alternatives for the proposed anesthesia with the patient or authorized representative who has indicated his/her understanding and acceptance.     Dental advisory given  Plan Discussed with: CRNA  Anesthesia Plan Comments:         Anesthesia Quick Evaluation

## 2023-12-08 NOTE — Transfer of Care (Signed)
 Immediate Anesthesia Transfer of Care Note  Patient: Aaron Bradshaw  Procedure(s) Performed: INSERTION, GOLD SEEDS (Prostate) INJECTION, HYDROGEL SPACER (Prostate)  Patient Location: PACU  Anesthesia Type:MAC  Level of Consciousness: drowsy and patient cooperative  Airway & Oxygen Therapy: Patient Spontanous Breathing and Patient connected to nasal cannula oxygen  Post-op Assessment: Report given to RN and Post -op Vital signs reviewed and stable  Post vital signs: Reviewed and stable  Last Vitals:  Vitals Value Taken Time  BP 138/80 12/08/23 1200  Temp 36.2 C 12/08/23 1200  Pulse 46 12/08/23 1200  Resp 24 12/08/23 1200  SpO2 98 % 12/08/23 1200  Vitals shown include unfiled device data.  Last Pain:  Vitals:   12/08/23 1055  TempSrc: Oral  PainSc: 2       Patients Stated Pain Goal: 4 (12/08/23 1055)  Complications: No notable events documented.

## 2023-12-08 NOTE — Brief Op Note (Signed)
 12/08/2023  11:50 AM  PATIENT:  Aaron Bradshaw  73 y.o. male  PRE-OPERATIVE DIAGNOSIS:  PROSTATE CANCER  POST-OPERATIVE DIAGNOSIS:  PROSTATE CANCER  PROCEDURE:  Procedure(s) with comments: INSERTION, GOLD SEEDS (N/A) - GOLD SEEDS WITH SPACE OAR INJECTION, HYDROGEL SPACER (N/A)  SURGEON:  Surgeons and Role:    * Manny, Harvey Linen., MD - Primary  PHYSICIAN ASSISTANT:   ASSISTANTS: none   ANESTHESIA:   local and MAC  EBL:  minimal   BLOOD ADMINISTERED:none  DRAINS: none   LOCAL MEDICATIONS USED:  LIDOCAINE    SPECIMEN:  No Specimen  DISPOSITION OF SPECIMEN:  N/A  COUNTS:  YES  TOURNIQUET:  * No tourniquets in log *  DICTATION: .Other Dictation: Dictation Number [id number]  PLAN OF CARE: Discharge to home after PACU  PATIENT DISPOSITION:  PACU - hemodynamically stable.   Delay start of Pharmacological VTE agent (>24hrs) due to surgical blood loss or risk of bleeding: not applicable

## 2023-12-09 NOTE — Op Note (Signed)
 NAME: Aaron Bradshaw, Aaron Bradshaw MEDICAL RECORD NO: [id number] ACCOUNT NO: [id number] DATE OF BIRTH: 06-19-1951 FACILITY: [facility] LOCATION: [facility] PHYSICIAN: Osborn Blaze, MD  Operative Report   PREOPERATIVE DIAGNOSIS: Moderate-risk prostate cancer.  PROCEDURE PERFORMED: 1.  Prostate fiducial marker placement. 2.  Ultrasound guidance. 3.  SpaceOAR placement.  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 He is on a curative path with primary radiation without androgen deprivation. Staging imaging is localized. The  radiation oncology team has requested fiducial marker and SpaceOAR placement as a part of this pathway. He presents for this today. Informed consent was obtained and placed in the medical record.  DESCRIPTION OF PROCEDURE:  The patient was verified as himself and procedure being prostate fiducial marker placement under ultrasound guidance and SpaceOAR placement was confirmed.  Procedure timeout was performed.  Intravenous antibiotic administered.    Monitored anesthesia care, conscious sedation was performed. The patient was placed in a medium lithotomy position.  A sterile field was created by prepping and draping the patient's perineum using iodine after his clipper shaving and after his penis  and scrotum were taken to the operative field using large Tegaderm. Transrectal ultrasound was then introduced. The prostate was homogenous without any evidence of extraprostatic disease. Using a transperineal approach, fiducial markers were placed at  the right mid base, right mid apex, and left mid lateral positions respectively. Next, the SpaceOAR introducer needle was placed via transperineal location under ultrasound guidance in the midline to the anterior perirectal space after the probe was  taken off of anterior tension. Position was confirmed with 2 mL of saline hydrodissection and 10 mL of gel  matrix was placed as per manufacturer's guidelines every 10 seconds. Rectal exam confirmed excellent SpaceOAR effect, but no evidence of  intraluminal gel. Procedure was terminated. The patient tolerated the procedure well.  No immediate periprocedural complications. The patient was taken to Postanesthesia Care Unit with plan for discharge home.        PAA D: 12/08/2023 11:54:02 am T: 12/09/2023 2:54:00 am  JOB: [id number]/ [id number]

## 2023-12-10 NOTE — Anesthesia Postprocedure Evaluation (Signed)
 Anesthesia Post Note  Patient: Aaron Bradshaw  Procedure(s) Performed: INSERTION, GOLD SEEDS (Prostate) INJECTION, HYDROGEL SPACER (Prostate)     Patient location during evaluation: PACU Anesthesia Type: MAC Level of consciousness: awake and alert Pain management: pain level controlled Vital Signs Assessment: post-procedure vital signs reviewed and stable Respiratory status: spontaneous breathing Cardiovascular status: stable Anesthetic complications: no  No notable events documented.  Last Vitals:  Vitals:   12/08/23 1215 12/08/23 1230  BP: 139/83 (!) 141/82  Pulse: (!) 53 (!) 54  Resp: 15 13  Temp:  36.4 C  SpO2: 94% 96%    Last Pain:  Vitals:   12/08/23 1230  TempSrc:   PainSc: 0-No pain                 Gorman Laughter

## 2023-12-11 ENCOUNTER — Telehealth: Payer: Self-pay | Admitting: *Deleted

## 2023-12-11 ENCOUNTER — Encounter (HOSPITAL_COMMUNITY): Payer: Self-pay | Admitting: Urology

## 2023-12-11 NOTE — Telephone Encounter (Signed)
 CALLED PATIENT TO REMIND OF SIM AND MRI FOR 12-12-23, ARRIVAL TIME- @ [facility] - 8:45 AM, INFORMED PATIENT TO ARRIVE WITH A FULL BLADDER, AND HIS MRI FOR 12-12-23 - ARRIVAL TIME- 11:30 AM @ [facility], PATIENT TO BE NPO- 4 HRS. PRIOR TO SCAN, SPOKE WITH PATIENT'S WIFE AND SHE IS AWARE OF THESE APPTS. AND THE INSTRUCTIONS

## 2023-12-12 ENCOUNTER — Ambulatory Visit
Admission: RE | Admit: 2023-12-12 | Discharge: 2023-12-12 | Disposition: A | Discharge status: Home / Self Care | Source: Ambulatory Visit | Attending: Urology | Admitting: Urology

## 2023-12-12 ENCOUNTER — Ambulatory Visit (HOSPITAL_COMMUNITY)
Admission: RE | Admit: 2023-12-12 | Discharge: 2023-12-12 | Disposition: A | Discharge status: Home / Self Care | Source: Ambulatory Visit | Attending: Urology | Admitting: Urology

## 2023-12-12 ENCOUNTER — Encounter (HOSPITAL_COMMUNITY): Payer: Self-pay | Admitting: Urology

## 2023-12-12 DIAGNOSIS — Z510 Encounter for antineoplastic radiation therapy: Secondary | ICD-10-CM | POA: Diagnosis not present

## 2023-12-12 DIAGNOSIS — C61 Malignant neoplasm of prostate: Secondary | ICD-10-CM | POA: Insufficient documentation

## 2023-12-12 DIAGNOSIS — Z191 Hormone sensitive malignancy status: Secondary | ICD-10-CM | POA: Diagnosis not present

## 2023-12-12 NOTE — Progress Notes (Signed)
  Radiation Oncology         [phone]) [phone] ________________________________  Name: Aaron Bradshaw MRN: [id number]  Date: 12/12/2023  DOB: [birth year]  SIMULATION AND TREATMENT PLANNING NOTE    ICD-10-CM   1. Malignant neoplasm of prostate (HCC)  C61       DIAGNOSIS:  73 y.o. gentleman with Stage T1c adenocarcinoma of the prostate with Gleason score of 4+3, and PSA of 4.3.  NARRATIVE:  The patient was brought to the CT Simulation planning suite.  Identity was confirmed.  All relevant records and images related to the planned course of therapy were reviewed.  The patient freely provided informed written consent to proceed with treatment after reviewing the details related to the planned course of therapy. The consent form was witnessed and verified by the simulation staff.  Then, the patient was set-up in a stable reproducible supine position for radiation therapy.  A vacuum lock pillow device was custom fabricated to position his legs in a reproducible immobilized position.  Then, supervised the performance of a urethrogram under sterile conditions to identify the prostatic apex.  CT images were obtained.  Surface markings were placed.  The CT images were loaded into the planning software.  Then the prostate target and avoidance structures including the rectum, bladder, bowel and hips were contoured.  Treatment planning then occurred.  The radiation prescription was entered and confirmed.  A total of one complex treatment devices was fabricated. I have requested : Intensity Modulated Radiotherapy (IMRT) is medically necessary for this case for the following reason:  Rectal sparing.  I have requested daily cone beam CT volumetric image gudiance to track gold fiducial posiitoning along with bladder and rectal filling, this is medically necessary to assure accurate positioning of high dose radiation.  PLAN:  The patient will receive 70 Gy in 28 fractions.  ________________________________  Trilby Fujisawa Lorri Rota, M.D.

## 2023-12-13 DIAGNOSIS — Z191 Hormone sensitive malignancy status: Secondary | ICD-10-CM | POA: Diagnosis not present

## 2023-12-13 DIAGNOSIS — Z510 Encounter for antineoplastic radiation therapy: Secondary | ICD-10-CM | POA: Diagnosis not present

## 2023-12-13 DIAGNOSIS — C61 Malignant neoplasm of prostate: Secondary | ICD-10-CM | POA: Diagnosis not present

## 2023-12-14 ENCOUNTER — Other Ambulatory Visit: Payer: Self-pay | Admitting: Family Medicine

## 2023-12-21 ENCOUNTER — Ambulatory Visit
Admission: RE | Admit: 2023-12-21 | Discharge: 2023-12-21 | Disposition: A | Discharge status: Home / Self Care | Source: Ambulatory Visit | Attending: Radiation Oncology

## 2023-12-21 ENCOUNTER — Other Ambulatory Visit: Payer: Self-pay

## 2023-12-21 DIAGNOSIS — Z191 Hormone sensitive malignancy status: Secondary | ICD-10-CM | POA: Diagnosis not present

## 2023-12-21 DIAGNOSIS — C61 Malignant neoplasm of prostate: Secondary | ICD-10-CM | POA: Diagnosis not present

## 2023-12-21 DIAGNOSIS — Z510 Encounter for antineoplastic radiation therapy: Secondary | ICD-10-CM | POA: Diagnosis not present

## 2023-12-21 LAB — RAD ONC ARIA SESSION SUMMARY
Course Elapsed Days: 0
Plan Fractions Treated to Date: 1
Plan Prescribed Dose Per Fraction: 2.5 Gy
Plan Total Fractions Prescribed: 28
Plan Total Prescribed Dose: 70 Gy
Reference Point Dosage Given to Date: 2.5 Gy
Reference Point Session Dosage Given: 2.5 Gy
Session Number: 1

## 2023-12-22 ENCOUNTER — Ambulatory Visit
Admission: RE | Admit: 2023-12-22 | Discharge: 2023-12-22 | Disposition: A | Discharge status: Home / Self Care | Source: Ambulatory Visit | Attending: Radiation Oncology | Admitting: Radiation Oncology

## 2023-12-22 ENCOUNTER — Other Ambulatory Visit: Payer: Self-pay

## 2023-12-22 DIAGNOSIS — Z510 Encounter for antineoplastic radiation therapy: Secondary | ICD-10-CM | POA: Diagnosis not present

## 2023-12-22 DIAGNOSIS — Z191 Hormone sensitive malignancy status: Secondary | ICD-10-CM | POA: Diagnosis not present

## 2023-12-22 DIAGNOSIS — C61 Malignant neoplasm of prostate: Secondary | ICD-10-CM | POA: Diagnosis not present

## 2023-12-22 LAB — RAD ONC ARIA SESSION SUMMARY
Course Elapsed Days: 1
Plan Fractions Treated to Date: 2
Plan Prescribed Dose Per Fraction: 2.5 Gy
Plan Total Fractions Prescribed: 28
Plan Total Prescribed Dose: 70 Gy
Reference Point Dosage Given to Date: 5 Gy
Reference Point Session Dosage Given: 2.5 Gy
Session Number: 2

## 2023-12-25 ENCOUNTER — Ambulatory Visit
Admission: RE | Admit: 2023-12-25 | Discharge: 2023-12-25 | Disposition: A | Discharge status: Home / Self Care | Source: Ambulatory Visit | Attending: Radiation Oncology | Admitting: Radiation Oncology

## 2023-12-25 ENCOUNTER — Other Ambulatory Visit: Payer: Self-pay

## 2023-12-25 DIAGNOSIS — C61 Malignant neoplasm of prostate: Secondary | ICD-10-CM | POA: Insufficient documentation

## 2023-12-25 DIAGNOSIS — Z510 Encounter for antineoplastic radiation therapy: Secondary | ICD-10-CM | POA: Insufficient documentation

## 2023-12-25 DIAGNOSIS — Z191 Hormone sensitive malignancy status: Secondary | ICD-10-CM | POA: Diagnosis not present

## 2023-12-25 LAB — RAD ONC ARIA SESSION SUMMARY
Course Elapsed Days: 4
Plan Fractions Treated to Date: 3
Plan Prescribed Dose Per Fraction: 2.5 Gy
Plan Total Fractions Prescribed: 28
Plan Total Prescribed Dose: 70 Gy
Reference Point Dosage Given to Date: 7.5 Gy
Reference Point Session Dosage Given: 2.5 Gy
Session Number: 3

## 2023-12-26 ENCOUNTER — Other Ambulatory Visit: Payer: Self-pay

## 2023-12-26 ENCOUNTER — Ambulatory Visit
Admission: RE | Admit: 2023-12-26 | Discharge: 2023-12-26 | Disposition: A | Discharge status: Home / Self Care | Source: Ambulatory Visit | Attending: Radiation Oncology | Admitting: Radiation Oncology

## 2023-12-26 ENCOUNTER — Ambulatory Visit

## 2023-12-26 DIAGNOSIS — Z191 Hormone sensitive malignancy status: Secondary | ICD-10-CM | POA: Diagnosis not present

## 2023-12-26 DIAGNOSIS — Z510 Encounter for antineoplastic radiation therapy: Secondary | ICD-10-CM | POA: Diagnosis not present

## 2023-12-26 DIAGNOSIS — C61 Malignant neoplasm of prostate: Secondary | ICD-10-CM | POA: Diagnosis not present

## 2023-12-26 LAB — RAD ONC ARIA SESSION SUMMARY
Course Elapsed Days: 5
Plan Fractions Treated to Date: 4
Plan Prescribed Dose Per Fraction: 2.5 Gy
Plan Total Fractions Prescribed: 28
Plan Total Prescribed Dose: 70 Gy
Reference Point Dosage Given to Date: 10 Gy
Reference Point Session Dosage Given: 2.5 Gy
Session Number: 4

## 2023-12-27 ENCOUNTER — Ambulatory Visit
Admission: RE | Admit: 2023-12-27 | Discharge: 2023-12-27 | Disposition: A | Discharge status: Home / Self Care | Source: Ambulatory Visit | Attending: Radiation Oncology | Admitting: Radiation Oncology

## 2023-12-27 ENCOUNTER — Other Ambulatory Visit: Payer: Self-pay

## 2023-12-27 DIAGNOSIS — Z510 Encounter for antineoplastic radiation therapy: Secondary | ICD-10-CM | POA: Diagnosis not present

## 2023-12-27 DIAGNOSIS — Z191 Hormone sensitive malignancy status: Secondary | ICD-10-CM | POA: Diagnosis not present

## 2023-12-27 DIAGNOSIS — C61 Malignant neoplasm of prostate: Secondary | ICD-10-CM | POA: Diagnosis not present

## 2023-12-27 LAB — RAD ONC ARIA SESSION SUMMARY
Course Elapsed Days: 6
Plan Fractions Treated to Date: 5
Plan Prescribed Dose Per Fraction: 2.5 Gy
Plan Total Fractions Prescribed: 28
Plan Total Prescribed Dose: 70 Gy
Reference Point Dosage Given to Date: 12.5 Gy
Reference Point Session Dosage Given: 2.5 Gy
Session Number: 5

## 2023-12-28 ENCOUNTER — Other Ambulatory Visit: Payer: Self-pay

## 2023-12-28 ENCOUNTER — Ambulatory Visit
Admission: RE | Admit: 2023-12-28 | Discharge: 2023-12-28 | Disposition: A | Discharge status: Home / Self Care | Source: Ambulatory Visit | Attending: Radiation Oncology

## 2023-12-28 DIAGNOSIS — Z510 Encounter for antineoplastic radiation therapy: Secondary | ICD-10-CM | POA: Diagnosis not present

## 2023-12-28 DIAGNOSIS — C61 Malignant neoplasm of prostate: Secondary | ICD-10-CM | POA: Diagnosis not present

## 2023-12-28 DIAGNOSIS — Z191 Hormone sensitive malignancy status: Secondary | ICD-10-CM | POA: Diagnosis not present

## 2023-12-28 LAB — RAD ONC ARIA SESSION SUMMARY
Course Elapsed Days: 7
Plan Fractions Treated to Date: 6
Plan Prescribed Dose Per Fraction: 2.5 Gy
Plan Total Fractions Prescribed: 28
Plan Total Prescribed Dose: 70 Gy
Reference Point Dosage Given to Date: 15 Gy
Reference Point Session Dosage Given: 2.5 Gy
Session Number: 6

## 2023-12-29 ENCOUNTER — Other Ambulatory Visit: Payer: Self-pay | Admitting: Radiation Oncology

## 2023-12-29 ENCOUNTER — Ambulatory Visit
Admission: RE | Admit: 2023-12-29 | Discharge: 2023-12-29 | Disposition: A | Discharge status: Home / Self Care | Source: Ambulatory Visit | Attending: Radiation Oncology | Admitting: Radiation Oncology

## 2023-12-29 ENCOUNTER — Other Ambulatory Visit: Payer: Self-pay

## 2023-12-29 DIAGNOSIS — Z510 Encounter for antineoplastic radiation therapy: Secondary | ICD-10-CM | POA: Diagnosis not present

## 2023-12-29 DIAGNOSIS — C61 Malignant neoplasm of prostate: Secondary | ICD-10-CM | POA: Diagnosis not present

## 2023-12-29 DIAGNOSIS — Z191 Hormone sensitive malignancy status: Secondary | ICD-10-CM | POA: Diagnosis not present

## 2023-12-29 LAB — RAD ONC ARIA SESSION SUMMARY
Course Elapsed Days: 8
Plan Fractions Treated to Date: 7
Plan Prescribed Dose Per Fraction: 2.5 Gy
Plan Total Fractions Prescribed: 28
Plan Total Prescribed Dose: 70 Gy
Reference Point Dosage Given to Date: 17.5 Gy
Reference Point Session Dosage Given: 2.5 Gy
Session Number: 7

## 2023-12-29 MED ORDER — TAMSULOSIN HCL 0.4 MG PO CAPS: 0.4000 mg | ORAL_CAPSULE | Freq: Every day | ORAL | 5 refills | Status: AC

## 2023-12-31 ENCOUNTER — Other Ambulatory Visit: Payer: Self-pay | Admitting: Family Medicine

## 2024-01-01 ENCOUNTER — Ambulatory Visit

## 2024-01-01 ENCOUNTER — Encounter: Payer: Self-pay | Admitting: Family Medicine

## 2024-01-02 ENCOUNTER — Ambulatory Visit
Admission: RE | Admit: 2024-01-02 | Discharge: 2024-01-02 | Disposition: A | Discharge status: Home / Self Care | Source: Ambulatory Visit | Attending: Radiation Oncology | Admitting: Radiation Oncology

## 2024-01-02 ENCOUNTER — Ambulatory Visit

## 2024-01-02 ENCOUNTER — Other Ambulatory Visit: Payer: Self-pay

## 2024-01-02 DIAGNOSIS — Z191 Hormone sensitive malignancy status: Secondary | ICD-10-CM | POA: Diagnosis not present

## 2024-01-02 DIAGNOSIS — C61 Malignant neoplasm of prostate: Secondary | ICD-10-CM | POA: Diagnosis not present

## 2024-01-02 DIAGNOSIS — Z510 Encounter for antineoplastic radiation therapy: Secondary | ICD-10-CM | POA: Diagnosis not present

## 2024-01-02 LAB — RAD ONC ARIA SESSION SUMMARY
Course Elapsed Days: 12
Plan Fractions Treated to Date: 8
Plan Prescribed Dose Per Fraction: 2.5 Gy
Plan Total Fractions Prescribed: 28
Plan Total Prescribed Dose: 70 Gy
Reference Point Dosage Given to Date: 20 Gy
Reference Point Session Dosage Given: 2.5 Gy
Session Number: 8

## 2024-01-03 ENCOUNTER — Other Ambulatory Visit: Payer: Self-pay

## 2024-01-03 ENCOUNTER — Ambulatory Visit
Admission: RE | Admit: 2024-01-03 | Discharge: 2024-01-03 | Disposition: A | Discharge status: Home / Self Care | Source: Ambulatory Visit | Attending: Radiation Oncology | Admitting: Radiation Oncology

## 2024-01-03 DIAGNOSIS — C61 Malignant neoplasm of prostate: Secondary | ICD-10-CM | POA: Diagnosis not present

## 2024-01-03 DIAGNOSIS — Z510 Encounter for antineoplastic radiation therapy: Secondary | ICD-10-CM | POA: Diagnosis not present

## 2024-01-03 DIAGNOSIS — Z191 Hormone sensitive malignancy status: Secondary | ICD-10-CM | POA: Diagnosis not present

## 2024-01-03 LAB — RAD ONC ARIA SESSION SUMMARY
Course Elapsed Days: 13
Plan Fractions Treated to Date: 9
Plan Prescribed Dose Per Fraction: 2.5 Gy
Plan Total Fractions Prescribed: 28
Plan Total Prescribed Dose: 70 Gy
Reference Point Dosage Given to Date: 22.5 Gy
Reference Point Session Dosage Given: 2.5 Gy
Session Number: 9

## 2024-01-04 ENCOUNTER — Other Ambulatory Visit: Payer: Self-pay | Admitting: Family Medicine

## 2024-01-04 ENCOUNTER — Other Ambulatory Visit: Payer: Self-pay

## 2024-01-04 ENCOUNTER — Ambulatory Visit
Admission: RE | Admit: 2024-01-04 | Discharge: 2024-01-04 | Disposition: A | Discharge status: Home / Self Care | Source: Ambulatory Visit | Attending: Radiation Oncology | Admitting: Radiation Oncology

## 2024-01-04 DIAGNOSIS — Z510 Encounter for antineoplastic radiation therapy: Secondary | ICD-10-CM | POA: Diagnosis not present

## 2024-01-04 DIAGNOSIS — C61 Malignant neoplasm of prostate: Secondary | ICD-10-CM | POA: Diagnosis not present

## 2024-01-04 DIAGNOSIS — Z191 Hormone sensitive malignancy status: Secondary | ICD-10-CM | POA: Diagnosis not present

## 2024-01-04 LAB — RAD ONC ARIA SESSION SUMMARY
Course Elapsed Days: 14
Plan Fractions Treated to Date: 10
Plan Prescribed Dose Per Fraction: 2.5 Gy
Plan Total Fractions Prescribed: 28
Plan Total Prescribed Dose: 70 Gy
Reference Point Dosage Given to Date: 25 Gy
Reference Point Session Dosage Given: 2.5 Gy
Session Number: 10

## 2024-01-05 ENCOUNTER — Ambulatory Visit
Admission: RE | Admit: 2024-01-05 | Discharge: 2024-01-05 | Disposition: A | Discharge status: Home / Self Care | Source: Ambulatory Visit | Attending: Radiation Oncology | Admitting: Radiation Oncology

## 2024-01-05 ENCOUNTER — Other Ambulatory Visit: Payer: Self-pay

## 2024-01-05 DIAGNOSIS — C61 Malignant neoplasm of prostate: Secondary | ICD-10-CM | POA: Diagnosis not present

## 2024-01-05 DIAGNOSIS — Z510 Encounter for antineoplastic radiation therapy: Secondary | ICD-10-CM | POA: Diagnosis not present

## 2024-01-05 DIAGNOSIS — Z191 Hormone sensitive malignancy status: Secondary | ICD-10-CM | POA: Diagnosis not present

## 2024-01-05 LAB — RAD ONC ARIA SESSION SUMMARY
Course Elapsed Days: 15
Plan Fractions Treated to Date: 11
Plan Prescribed Dose Per Fraction: 2.5 Gy
Plan Total Fractions Prescribed: 28
Plan Total Prescribed Dose: 70 Gy
Reference Point Dosage Given to Date: 27.5 Gy
Reference Point Session Dosage Given: 2.5 Gy
Session Number: 11

## 2024-01-08 ENCOUNTER — Ambulatory Visit
Admission: RE | Admit: 2024-01-08 | Discharge: 2024-01-08 | Disposition: A | Discharge status: Home / Self Care | Source: Ambulatory Visit | Attending: Radiation Oncology | Admitting: Radiation Oncology

## 2024-01-08 ENCOUNTER — Other Ambulatory Visit: Payer: Self-pay

## 2024-01-08 DIAGNOSIS — C61 Malignant neoplasm of prostate: Secondary | ICD-10-CM | POA: Diagnosis not present

## 2024-01-08 DIAGNOSIS — Z510 Encounter for antineoplastic radiation therapy: Secondary | ICD-10-CM | POA: Diagnosis not present

## 2024-01-08 DIAGNOSIS — Z191 Hormone sensitive malignancy status: Secondary | ICD-10-CM | POA: Diagnosis not present

## 2024-01-08 LAB — RAD ONC ARIA SESSION SUMMARY
Course Elapsed Days: 18
Plan Fractions Treated to Date: 12
Plan Prescribed Dose Per Fraction: 2.5 Gy
Plan Total Fractions Prescribed: 28
Plan Total Prescribed Dose: 70 Gy
Reference Point Dosage Given to Date: 30 Gy
Reference Point Session Dosage Given: 2.5 Gy
Session Number: 12

## 2024-01-09 ENCOUNTER — Other Ambulatory Visit: Payer: Self-pay

## 2024-01-09 ENCOUNTER — Ambulatory Visit
Admission: RE | Admit: 2024-01-09 | Discharge: 2024-01-09 | Disposition: A | Discharge status: Home / Self Care | Source: Ambulatory Visit | Attending: Radiation Oncology | Admitting: Radiation Oncology

## 2024-01-09 DIAGNOSIS — Z191 Hormone sensitive malignancy status: Secondary | ICD-10-CM | POA: Diagnosis not present

## 2024-01-09 DIAGNOSIS — C61 Malignant neoplasm of prostate: Secondary | ICD-10-CM | POA: Diagnosis not present

## 2024-01-09 DIAGNOSIS — Z510 Encounter for antineoplastic radiation therapy: Secondary | ICD-10-CM | POA: Diagnosis not present

## 2024-01-09 LAB — RAD ONC ARIA SESSION SUMMARY
Course Elapsed Days: 19
Plan Fractions Treated to Date: 13
Plan Prescribed Dose Per Fraction: 2.5 Gy
Plan Total Fractions Prescribed: 28
Plan Total Prescribed Dose: 70 Gy
Reference Point Dosage Given to Date: 32.5 Gy
Reference Point Session Dosage Given: 2.5 Gy
Session Number: 13

## 2024-01-10 ENCOUNTER — Ambulatory Visit
Admission: RE | Admit: 2024-01-10 | Discharge: 2024-01-10 | Disposition: A | Discharge status: Home / Self Care | Source: Ambulatory Visit | Attending: Radiation Oncology

## 2024-01-10 ENCOUNTER — Other Ambulatory Visit: Payer: Self-pay

## 2024-01-10 DIAGNOSIS — Z510 Encounter for antineoplastic radiation therapy: Secondary | ICD-10-CM | POA: Diagnosis not present

## 2024-01-10 DIAGNOSIS — Z191 Hormone sensitive malignancy status: Secondary | ICD-10-CM | POA: Diagnosis not present

## 2024-01-10 DIAGNOSIS — C61 Malignant neoplasm of prostate: Secondary | ICD-10-CM | POA: Diagnosis not present

## 2024-01-10 LAB — RAD ONC ARIA SESSION SUMMARY
Course Elapsed Days: 20
Plan Fractions Treated to Date: 14
Plan Prescribed Dose Per Fraction: 2.5 Gy
Plan Total Fractions Prescribed: 28
Plan Total Prescribed Dose: 70 Gy
Reference Point Dosage Given to Date: 35 Gy
Reference Point Session Dosage Given: 2.5 Gy
Session Number: 14

## 2024-01-11 ENCOUNTER — Ambulatory Visit
Admission: RE | Admit: 2024-01-11 | Discharge: 2024-01-11 | Disposition: A | Discharge status: Home / Self Care | Source: Ambulatory Visit | Attending: Radiation Oncology | Admitting: Radiation Oncology

## 2024-01-11 ENCOUNTER — Other Ambulatory Visit: Payer: Self-pay

## 2024-01-11 DIAGNOSIS — Z191 Hormone sensitive malignancy status: Secondary | ICD-10-CM | POA: Diagnosis not present

## 2024-01-11 DIAGNOSIS — C61 Malignant neoplasm of prostate: Secondary | ICD-10-CM | POA: Diagnosis not present

## 2024-01-11 DIAGNOSIS — Z510 Encounter for antineoplastic radiation therapy: Secondary | ICD-10-CM | POA: Diagnosis not present

## 2024-01-11 LAB — RAD ONC ARIA SESSION SUMMARY
Course Elapsed Days: 21
Plan Fractions Treated to Date: 15
Plan Prescribed Dose Per Fraction: 2.5 Gy
Plan Total Fractions Prescribed: 28
Plan Total Prescribed Dose: 70 Gy
Reference Point Dosage Given to Date: 37.5 Gy
Reference Point Session Dosage Given: 2.5 Gy
Session Number: 15

## 2024-01-12 ENCOUNTER — Telehealth: Payer: Self-pay

## 2024-01-12 ENCOUNTER — Ambulatory Visit
Admission: RE | Admit: 2024-01-12 | Discharge: 2024-01-12 | Disposition: A | Discharge status: Home / Self Care | Source: Ambulatory Visit | Attending: Radiation Oncology | Admitting: Radiation Oncology

## 2024-01-12 ENCOUNTER — Other Ambulatory Visit: Payer: Self-pay

## 2024-01-12 DIAGNOSIS — Z510 Encounter for antineoplastic radiation therapy: Secondary | ICD-10-CM | POA: Diagnosis not present

## 2024-01-12 DIAGNOSIS — C61 Malignant neoplasm of prostate: Secondary | ICD-10-CM | POA: Diagnosis not present

## 2024-01-12 DIAGNOSIS — Z191 Hormone sensitive malignancy status: Secondary | ICD-10-CM | POA: Diagnosis not present

## 2024-01-12 LAB — RAD ONC ARIA SESSION SUMMARY
Course Elapsed Days: 22
Plan Fractions Treated to Date: 16
Plan Prescribed Dose Per Fraction: 2.5 Gy
Plan Total Fractions Prescribed: 28
Plan Total Prescribed Dose: 70 Gy
Reference Point Dosage Given to Date: 40 Gy
Reference Point Session Dosage Given: 2.5 Gy
Session Number: 16

## 2024-01-12 NOTE — Telephone Encounter (Signed)
 Received voicemail from Patient regarding need for assistance with filing cancer forms. Returned phone call. Patient requests appointment to come and talk with someone regarding assistance with forms. Explained to Patient that no appointment was needed and provided information regarding Release of Information Forms. Patient states that he will come this afternoon. No other needs or concerns noted at this time.

## 2024-01-15 ENCOUNTER — Ambulatory Visit
Admission: RE | Admit: 2024-01-15 | Discharge: 2024-01-15 | Disposition: A | Discharge status: Home / Self Care | Source: Ambulatory Visit | Attending: Radiation Oncology

## 2024-01-15 ENCOUNTER — Other Ambulatory Visit: Payer: Self-pay

## 2024-01-15 DIAGNOSIS — C61 Malignant neoplasm of prostate: Secondary | ICD-10-CM | POA: Diagnosis not present

## 2024-01-15 DIAGNOSIS — Z510 Encounter for antineoplastic radiation therapy: Secondary | ICD-10-CM | POA: Diagnosis not present

## 2024-01-15 DIAGNOSIS — Z191 Hormone sensitive malignancy status: Secondary | ICD-10-CM | POA: Diagnosis not present

## 2024-01-15 LAB — RAD ONC ARIA SESSION SUMMARY
Course Elapsed Days: 25
Plan Fractions Treated to Date: 17
Plan Prescribed Dose Per Fraction: 2.5 Gy
Plan Total Fractions Prescribed: 28
Plan Total Prescribed Dose: 70 Gy
Reference Point Dosage Given to Date: 42.5 Gy
Reference Point Session Dosage Given: 2.5 Gy
Session Number: 17

## 2024-01-16 ENCOUNTER — Ambulatory Visit
Admission: RE | Admit: 2024-01-16 | Discharge: 2024-01-16 | Disposition: A | Discharge status: Home / Self Care | Source: Ambulatory Visit | Attending: Radiation Oncology | Admitting: Radiation Oncology

## 2024-01-16 ENCOUNTER — Other Ambulatory Visit: Payer: Self-pay

## 2024-01-16 DIAGNOSIS — C61 Malignant neoplasm of prostate: Secondary | ICD-10-CM | POA: Diagnosis not present

## 2024-01-16 DIAGNOSIS — Z191 Hormone sensitive malignancy status: Secondary | ICD-10-CM | POA: Diagnosis not present

## 2024-01-16 DIAGNOSIS — Z510 Encounter for antineoplastic radiation therapy: Secondary | ICD-10-CM | POA: Diagnosis not present

## 2024-01-16 LAB — RAD ONC ARIA SESSION SUMMARY
Course Elapsed Days: 26
Plan Fractions Treated to Date: 18
Plan Prescribed Dose Per Fraction: 2.5 Gy
Plan Total Fractions Prescribed: 28
Plan Total Prescribed Dose: 70 Gy
Reference Point Dosage Given to Date: 45 Gy
Reference Point Session Dosage Given: 2.5 Gy
Session Number: 18

## 2024-01-17 ENCOUNTER — Ambulatory Visit
Admission: RE | Admit: 2024-01-17 | Discharge: 2024-01-17 | Disposition: A | Discharge status: Home / Self Care | Source: Ambulatory Visit | Attending: Radiation Oncology

## 2024-01-17 ENCOUNTER — Other Ambulatory Visit: Payer: Self-pay

## 2024-01-17 DIAGNOSIS — Z510 Encounter for antineoplastic radiation therapy: Secondary | ICD-10-CM | POA: Diagnosis not present

## 2024-01-17 DIAGNOSIS — Z191 Hormone sensitive malignancy status: Secondary | ICD-10-CM | POA: Diagnosis not present

## 2024-01-17 DIAGNOSIS — C61 Malignant neoplasm of prostate: Secondary | ICD-10-CM | POA: Diagnosis not present

## 2024-01-17 LAB — RAD ONC ARIA SESSION SUMMARY
Course Elapsed Days: 27
Plan Fractions Treated to Date: 19
Plan Prescribed Dose Per Fraction: 2.5 Gy
Plan Total Fractions Prescribed: 28
Plan Total Prescribed Dose: 70 Gy
Reference Point Dosage Given to Date: 47.5 Gy
Reference Point Session Dosage Given: 2.5 Gy
Session Number: 19

## 2024-01-18 ENCOUNTER — Other Ambulatory Visit: Payer: Self-pay

## 2024-01-18 ENCOUNTER — Ambulatory Visit
Admission: RE | Admit: 2024-01-18 | Discharge: 2024-01-18 | Disposition: A | Discharge status: Home / Self Care | Source: Ambulatory Visit | Attending: Radiation Oncology | Admitting: Radiation Oncology

## 2024-01-18 DIAGNOSIS — Z191 Hormone sensitive malignancy status: Secondary | ICD-10-CM | POA: Diagnosis not present

## 2024-01-18 DIAGNOSIS — J3081 Allergic rhinitis due to animal (cat) (dog) hair and dander: Secondary | ICD-10-CM | POA: Diagnosis not present

## 2024-01-18 DIAGNOSIS — J301 Allergic rhinitis due to pollen: Secondary | ICD-10-CM | POA: Diagnosis not present

## 2024-01-18 DIAGNOSIS — J3089 Other allergic rhinitis: Secondary | ICD-10-CM | POA: Diagnosis not present

## 2024-01-18 DIAGNOSIS — C61 Malignant neoplasm of prostate: Secondary | ICD-10-CM | POA: Diagnosis not present

## 2024-01-18 DIAGNOSIS — Z510 Encounter for antineoplastic radiation therapy: Secondary | ICD-10-CM | POA: Diagnosis not present

## 2024-01-18 LAB — RAD ONC ARIA SESSION SUMMARY
Course Elapsed Days: 28
Plan Fractions Treated to Date: 20
Plan Prescribed Dose Per Fraction: 2.5 Gy
Plan Total Fractions Prescribed: 28
Plan Total Prescribed Dose: 70 Gy
Reference Point Dosage Given to Date: 50 Gy
Reference Point Session Dosage Given: 2.5 Gy
Session Number: 20

## 2024-01-19 ENCOUNTER — Ambulatory Visit

## 2024-01-22 ENCOUNTER — Other Ambulatory Visit: Payer: Self-pay

## 2024-01-22 ENCOUNTER — Ambulatory Visit
Admission: RE | Admit: 2024-01-22 | Discharge: 2024-01-22 | Disposition: A | Discharge status: Home / Self Care | Source: Ambulatory Visit | Attending: Radiation Oncology

## 2024-01-22 DIAGNOSIS — C61 Malignant neoplasm of prostate: Secondary | ICD-10-CM | POA: Diagnosis not present

## 2024-01-22 DIAGNOSIS — Z510 Encounter for antineoplastic radiation therapy: Secondary | ICD-10-CM | POA: Diagnosis not present

## 2024-01-22 DIAGNOSIS — Z191 Hormone sensitive malignancy status: Secondary | ICD-10-CM | POA: Diagnosis not present

## 2024-01-22 LAB — RAD ONC ARIA SESSION SUMMARY
Course Elapsed Days: 32
Plan Fractions Treated to Date: 21
Plan Prescribed Dose Per Fraction: 2.5 Gy
Plan Total Fractions Prescribed: 28
Plan Total Prescribed Dose: 70 Gy
Reference Point Dosage Given to Date: 52.5 Gy
Reference Point Session Dosage Given: 2.5 Gy
Session Number: 21

## 2024-01-23 ENCOUNTER — Ambulatory Visit
Admission: RE | Admit: 2024-01-23 | Discharge: 2024-01-23 | Disposition: A | Discharge status: Home / Self Care | Source: Ambulatory Visit | Attending: Radiation Oncology | Admitting: Radiation Oncology

## 2024-01-23 ENCOUNTER — Other Ambulatory Visit: Payer: Self-pay

## 2024-01-23 DIAGNOSIS — Z191 Hormone sensitive malignancy status: Secondary | ICD-10-CM | POA: Diagnosis not present

## 2024-01-23 DIAGNOSIS — Z510 Encounter for antineoplastic radiation therapy: Secondary | ICD-10-CM | POA: Diagnosis not present

## 2024-01-23 DIAGNOSIS — C61 Malignant neoplasm of prostate: Secondary | ICD-10-CM | POA: Insufficient documentation

## 2024-01-23 LAB — RAD ONC ARIA SESSION SUMMARY
Course Elapsed Days: 33
Plan Fractions Treated to Date: 22
Plan Prescribed Dose Per Fraction: 2.5 Gy
Plan Total Fractions Prescribed: 28
Plan Total Prescribed Dose: 70 Gy
Reference Point Dosage Given to Date: 55 Gy
Reference Point Session Dosage Given: 2.5 Gy
Session Number: 22

## 2024-01-24 ENCOUNTER — Ambulatory Visit
Admission: RE | Admit: 2024-01-24 | Discharge: 2024-01-24 | Disposition: A | Discharge status: Home / Self Care | Source: Ambulatory Visit | Attending: Radiation Oncology

## 2024-01-24 ENCOUNTER — Other Ambulatory Visit: Payer: Self-pay

## 2024-01-24 DIAGNOSIS — C61 Malignant neoplasm of prostate: Secondary | ICD-10-CM | POA: Diagnosis not present

## 2024-01-24 DIAGNOSIS — Z191 Hormone sensitive malignancy status: Secondary | ICD-10-CM | POA: Diagnosis not present

## 2024-01-24 DIAGNOSIS — Z510 Encounter for antineoplastic radiation therapy: Secondary | ICD-10-CM | POA: Diagnosis not present

## 2024-01-24 LAB — RAD ONC ARIA SESSION SUMMARY
Course Elapsed Days: 34
Plan Fractions Treated to Date: 23
Plan Prescribed Dose Per Fraction: 2.5 Gy
Plan Total Fractions Prescribed: 28
Plan Total Prescribed Dose: 70 Gy
Reference Point Dosage Given to Date: 57.5 Gy
Reference Point Session Dosage Given: 2.5 Gy
Session Number: 23

## 2024-01-25 ENCOUNTER — Ambulatory Visit
Admission: RE | Admit: 2024-01-25 | Discharge: 2024-01-25 | Disposition: A | Discharge status: Home / Self Care | Source: Ambulatory Visit | Attending: Radiation Oncology | Admitting: Radiation Oncology

## 2024-01-25 ENCOUNTER — Other Ambulatory Visit: Payer: Self-pay

## 2024-01-25 ENCOUNTER — Ambulatory Visit

## 2024-01-25 DIAGNOSIS — C61 Malignant neoplasm of prostate: Secondary | ICD-10-CM | POA: Diagnosis not present

## 2024-01-25 DIAGNOSIS — Z191 Hormone sensitive malignancy status: Secondary | ICD-10-CM | POA: Diagnosis not present

## 2024-01-25 DIAGNOSIS — Z510 Encounter for antineoplastic radiation therapy: Secondary | ICD-10-CM | POA: Diagnosis not present

## 2024-01-25 LAB — RAD ONC ARIA SESSION SUMMARY
Course Elapsed Days: 35
Plan Fractions Treated to Date: 24
Plan Prescribed Dose Per Fraction: 2.5 Gy
Plan Total Fractions Prescribed: 28
Plan Total Prescribed Dose: 70 Gy
Reference Point Dosage Given to Date: 60 Gy
Reference Point Session Dosage Given: 2.5 Gy
Session Number: 24

## 2024-01-29 ENCOUNTER — Ambulatory Visit

## 2024-01-29 ENCOUNTER — Other Ambulatory Visit: Payer: Self-pay

## 2024-01-29 ENCOUNTER — Ambulatory Visit
Admission: RE | Admit: 2024-01-29 | Discharge: 2024-01-29 | Disposition: A | Discharge status: Home / Self Care | Source: Ambulatory Visit | Attending: Radiation Oncology | Admitting: Radiation Oncology

## 2024-01-29 DIAGNOSIS — J301 Allergic rhinitis due to pollen: Secondary | ICD-10-CM | POA: Diagnosis not present

## 2024-01-29 DIAGNOSIS — J3081 Allergic rhinitis due to animal (cat) (dog) hair and dander: Secondary | ICD-10-CM | POA: Diagnosis not present

## 2024-01-29 DIAGNOSIS — J3089 Other allergic rhinitis: Secondary | ICD-10-CM | POA: Diagnosis not present

## 2024-01-29 DIAGNOSIS — Z191 Hormone sensitive malignancy status: Secondary | ICD-10-CM | POA: Diagnosis not present

## 2024-01-29 DIAGNOSIS — Z510 Encounter for antineoplastic radiation therapy: Secondary | ICD-10-CM | POA: Diagnosis not present

## 2024-01-29 DIAGNOSIS — C61 Malignant neoplasm of prostate: Secondary | ICD-10-CM | POA: Diagnosis not present

## 2024-01-29 LAB — RAD ONC ARIA SESSION SUMMARY
Course Elapsed Days: 39
Plan Fractions Treated to Date: 25
Plan Prescribed Dose Per Fraction: 2.5 Gy
Plan Total Fractions Prescribed: 28
Plan Total Prescribed Dose: 70 Gy
Reference Point Dosage Given to Date: 62.5 Gy
Reference Point Session Dosage Given: 2.5 Gy
Session Number: 25

## 2024-01-30 ENCOUNTER — Ambulatory Visit
Admission: RE | Admit: 2024-01-30 | Discharge: 2024-01-30 | Disposition: A | Discharge status: Home / Self Care | Source: Ambulatory Visit | Attending: Radiation Oncology

## 2024-01-30 ENCOUNTER — Ambulatory Visit

## 2024-01-30 ENCOUNTER — Other Ambulatory Visit: Payer: Self-pay

## 2024-01-30 ENCOUNTER — Ambulatory Visit: Admitting: "Endocrinology

## 2024-01-30 ENCOUNTER — Encounter: Payer: Self-pay | Admitting: "Endocrinology

## 2024-01-30 VITALS — BP 122/80 | HR 64 | Ht 75.0 in | Wt 196.0 lb

## 2024-01-30 DIAGNOSIS — Z7984 Long term (current) use of oral hypoglycemic drugs: Secondary | ICD-10-CM | POA: Diagnosis not present

## 2024-01-30 DIAGNOSIS — Z510 Encounter for antineoplastic radiation therapy: Secondary | ICD-10-CM | POA: Diagnosis not present

## 2024-01-30 DIAGNOSIS — E119 Type 2 diabetes mellitus without complications: Secondary | ICD-10-CM | POA: Diagnosis not present

## 2024-01-30 DIAGNOSIS — Z7985 Long-term (current) use of injectable non-insulin antidiabetic drugs: Secondary | ICD-10-CM

## 2024-01-30 DIAGNOSIS — C61 Malignant neoplasm of prostate: Secondary | ICD-10-CM | POA: Diagnosis not present

## 2024-01-30 DIAGNOSIS — E7800 Pure hypercholesterolemia, unspecified: Secondary | ICD-10-CM

## 2024-01-30 DIAGNOSIS — Z191 Hormone sensitive malignancy status: Secondary | ICD-10-CM | POA: Diagnosis not present

## 2024-01-30 LAB — RAD ONC ARIA SESSION SUMMARY
Course Elapsed Days: 40
Plan Fractions Treated to Date: 26
Plan Prescribed Dose Per Fraction: 2.5 Gy
Plan Total Fractions Prescribed: 28
Plan Total Prescribed Dose: 70 Gy
Reference Point Dosage Given to Date: 65 Gy
Reference Point Session Dosage Given: 2.5 Gy
Session Number: 26

## 2024-01-30 MED ORDER — RYBELSUS 14 MG PO TABS: 14.0000 mg | ORAL_TABLET | Freq: Every day | ORAL | 1 refills | Status: DC

## 2024-01-30 NOTE — Patient Instructions (Signed)

## 2024-01-30 NOTE — Progress Notes (Signed)
 Outpatient Endocrinology Note Aaron Birmingham, MD  01/30/24   Aaron Bradshaw 11-24-1950 [id number]  Referring Provider: Micheal Bradshaw ORN, MD Primary Care Provider: Micheal Bradshaw ORN, MD Reason for consultation: Subjective   Assessment & Plan  Diagnoses and all orders for this visit:  Controlled type 2 diabetes mellitus without complication, without long-term current use of insulin  (HCC) -     Microalbumin / creatinine urine ratio -     Lipid panel -     Comprehensive metabolic panel with GFR  Long term (current) use of oral hypoglycemic drugs  Long-term (current) use of injectable non-insulin  antidiabetic drugs  Pure hypercholesterolemia  Other orders -     Semaglutide  (RYBELSUS ) 14 MG TABS; Take 1 tablet (14 mg total) by mouth daily.    Diabetes Type II without complications Lab Results  Component Value Date   GFR 86.94 02/15/2023   Hba1c goal less than 7, current Hba1c is  Lab Results  Component Value Date   HGBA1C 6.9 (A) 11/27/2023   Will recommend the following: Metformin  XR 750 mg bid  Jardiance  25 mg qd Stop ozempic  0.5mg /week, rybelsus  14 mg qam   No known contraindications/side effects to any of above medications  -Last LD and Tg are as follows: Lab Results  Component Value Date   LDLCALC 40 02/15/2023    Lab Results  Component Value Date   TRIG 129.0 02/15/2023   -On rosuvastatin  10 mg QD -Follow low fat diet and exercise   -Blood pressure goal <140/90 - Microalbumin/creatinine goal is < 30 -Last MA/Cr is as follows: Lab Results  Component Value Date   MICROALBUR <0.2 03/10/2020   -not on ACE/ARB  -diet changes including salt restriction -limit eating outside -counseled BP targets per standards of diabetes care -uncontrolled blood pressure can lead to retinopathy, nephropathy and cardiovascular and atherosclerotic heart disease  Reviewed and counseled on: -A1C target -Blood sugar targets -Complications of uncontrolled  diabetes  -Checking blood sugar before meals and bedtime and bring log next visit -All medications with mechanism of action and side effects -Hypoglycemia management: rule of 15's, Glucagon Emergency Kit and medical alert ID -low-carb low-fat plate-method diet -At least 20 minutes of physical activity per day -Annual dilated retinal eye exam and foot exam -compliance and follow up needs -follow up as scheduled or earlier if problem gets worse  Call if blood sugar is less than 70 or consistently above 250    Take a 15 gm snack of carbohydrate at bedtime before you go to sleep if your blood sugar is less than 100.    If you are going to fast after midnight for a test or procedure, ask your physician for instructions on how to reduce/decrease your insulin  dose.    Call if blood sugar is less than 70 or consistently above 250  -Treating a low sugar by rule of 15  (15 gms of sugar every 15 min until sugar is more than 70) If you feel your sugar is low, test your sugar to be sure If your sugar is low (less than 70), then take 15 grams of a fast acting Carbohydrate (3-4 glucose tablets or glucose gel or 4 ounces of juice or regular soda) Recheck your sugar 15 min after treating low to make sure it is more than 70 If sugar is still less than 70, treat again with 15 grams of carbohydrate          Don't drive the hour of hypoglycemia  If unconscious/unable  to eat or drink by mouth, use glucagon injection or nasal spray baqsimi and call 911. Can repeat again in 15 min if still unconscious.  No follow-ups on file.   I have reviewed current medications, nurse's notes, allergies, vital signs, past medical and surgical history, family medical history, and social history for this encounter. Counseled patient on symptoms, examination findings, lab findings, imaging results, treatment decisions and monitoring and prognosis. The patient understood the recommendations and agrees with the treatment plan. All  questions regarding treatment plan were fully answered.  Aaron Birmingham, MD  01/30/24    History of Present Illness Aaron Bradshaw is a 73 y.o. year old male who presents for evaluation of Type II diabetes mellitus.  Aaron Bradshaw was first diagnosed around 2010 Diabetes education +  Home diabetes regimen: Metformin  XR 750 mg bid  Jardiance  25 mg qd Ozempic  0.5mg /week   COMPLICATIONS -  MI/Stroke -  retinopathy -  neuropathy -  nephropathy  SYMPTOMS REVIEWED + Polyuria, on radiation treatment for prostate cancer  + Weight loss - Blurred vision  BLOOD SUGAR DATA 123-155 BG Checks once a week or so  Physical Exam  BP 122/80   Pulse 64   Ht 6' 3 (1.905 m)   Wt 196 lb (88.9 kg)   SpO2 97%   BMI 24.50 kg/m    Constitutional: well developed, well nourished Head: normocephalic, atraumatic Eyes: sclera anicteric, no redness Neck: supple Lungs: normal respiratory effort Neurology: alert and oriented Skin: dry, no appreciable rashes Musculoskeletal: no appreciable defects Psychiatric: normal mood and affect Diabetic Foot Exam - Simple   No data filed      Current Medications Patient's Medications  New Prescriptions   SEMAGLUTIDE  (RYBELSUS ) 14 MG TABS    Take 1 tablet (14 mg total) by mouth daily.  Previous Medications   ASCORBIC ACID (VITAMIN C) 250 MG TABLET    Take 250 mg by mouth daily.   B COMPLEX VITAMINS CAPSULE    Take 1 capsule by mouth daily.   CHOLECALCIFEROL PO    Take 1 tablet by mouth daily.   CO-ENZYME Q-10 30 MG CAPSULE    Take 30 mg by mouth daily.   CONTINUOUS GLUCOSE RECEIVER (FREESTYLE LIBRE 3 READER) DEVI    1 each by Does not apply route daily at 2 PM.   CONTINUOUS GLUCOSE SENSOR (FREESTYLE LIBRE 3 SENSOR) MISC    Use as directed to check blood sugar daily   EPIPEN  2-PAK 0.3 MG/0.3ML SOAJ INJECTION    USE AS DIRECTED AS NEEDED   GLUCOSE BLOOD (ONETOUCH VERIO) TEST STRIP    Use as instructed once a day   JARDIANCE  25 MG TABS  TABLET    TAKE 1 TABLET BY MOUTH EVERY DAY BEFORE BREAKFAST   KRILL OIL PO    Take 1 capsule by mouth daily.   LEVOTHYROXINE  (SYNTHROID ) 75 MCG TABLET    TAKE 1 TABLET BY MOUTH EVERY DAY   MAGNESIUM PO    Take 1 tablet by mouth daily.   METFORMIN  (GLUCOPHAGE -XR) 750 MG 24 HR TABLET    TAKE 1 TABLET BY MOUTH TWICE A DAY   OMEGA-3 FATTY ACIDS (FISH OIL) 1000 MG CAPS    Take by mouth.   ONETOUCH DELICA LANCETS 33G MISC    Use as directed once a day   PRESCRIPTION MEDICATION    Inject 1 each into the skin every 7 (seven) days. Patient gets an allergy shot once a week at [employer] off of [address]  PROBIOTIC PRODUCT (PROBIOTIC DAILY PO)    Take by mouth daily.   ROSUVASTATIN  (CRESTOR ) 10 MG TABLET    TAKE 1 TABLET BY MOUTH EVERY DAY   TAMSULOSIN  (FLOMAX ) 0.4 MG CAPS CAPSULE    Take 1 capsule (0.4 mg total) by mouth daily after supper.   TRAMADOL  (ULTRAM ) 50 MG TABLET    Take 1 tablet (50 mg total) by mouth every 6 (six) hours as needed for moderate pain (pain score 4-6) (post-operatively).  Modified Medications   No medications on file  Discontinued Medications   SEMAGLUTIDE ,0.25 OR 0.5MG /DOS, (OZEMPIC , 0.25 OR 0.5 MG/DOSE,) 2 MG/3ML SOPN    INJECT 0.5 MG INTO THE SKIN ONE TIME PER WEEK    Allergies Allergies  Allergen Reactions   Codeine Nausea Only   Prozac  [Fluoxetine  Hcl] Anxiety    Increased anxiety    Past Medical History 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   Diabetes mellitus (HCC) 12/2010   A1c 7.1 %   dx 4 yrs ago   GERD (gastroesophageal reflux disease)    Gilbert syndrome    Heart murmur    'when I was younger   History of chest pain    Hospitalized ER in [place] for CP and Palpitations with Neg Enzymes   Hyperlipidemia    Shingles    2015   Testosterone deficiency    Dr Duncan   Thyroid  disease    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    Past Surgical  History Past Surgical History:  Procedure Laterality Date   APPENDECTOMY     CHOLECYSTECTOMY  2010   COLONOSCOPY     Tics, Polyp 12/26/2003; polyps 2010, Dr Avram   EYE SURGERY  retina   GOLD SEED IMPLANT N/A 12/08/2023   Procedure: INSERTION, GOLD SEEDS;  Surgeon: Alvaro Ricardo KATHEE Mickey., MD;  Location: [facility] OR;  Service: Urology;  Laterality: N/A;  GOLD SEEDS WITH SPACE OAR   KNEE ARTHROSCOPY     Left   LAPAROSCOPIC APPENDECTOMY N/A 10/05/2015   Procedure: APPENDECTOMY LAPAROSCOPIC;  Surgeon: Lynwood Pina, MD;  Location: [facility] OR;  Service: General;  Laterality: N/A;   NOSE SURGERY     Dr  Jama   POLYPECTOMY     ROTATOR CUFF REPAIR Bilateral    left   SHOULDER ARTHROSCOPY     SPACE OAR INSTILLATION N/A 12/08/2023   Procedure: INJECTION, HYDROGEL SPACER;  Surgeon: Alvaro Ricardo KATHEE Mickey., MD;  Location: [facility] OR;  Service: Urology;  Laterality: N/A;    Family History family history includes Arthritis in his mother; Cancer in his father; Depression in his father, mother, and sister; Diabetes in his maternal grandfather and mother; Heart disease in his father; OCD in his father; Parkinsonism in his mother; Prostate cancer in his father and paternal grandfather; Stroke (age of onset: 86) in his mother.  Social History Social History   Socioeconomic History   Marital status: Married    Spouse name: Not on file   Number of children: Not on file   Years of education: Not on file   Highest education level: Bachelor's degree (e.g., BA, AB, BS)  Occupational History   Not on file  Tobacco Use   Smoking status: Never   Smokeless tobacco: Never  Vaping Use   Vaping status: Never Used  Substance and Sexual Activity  Alcohol use: Yes    Alcohol/week: 1.0 - 2.0 standard drink of alcohol    Types: 1 - 2 Standard drinks or equivalent per week    Comment: beer occ stopped wine   Drug use: No   Sexual activity: Not Currently  Other Topics Concern   Not on file  Social History Narrative   Not on  file   Social Drivers of Health   Financial Resource Strain: Low Risk  (08/29/2023)   Overall Financial Resource Strain (CARDIA)    Difficulty of Paying Living Expenses: Not very hard  Food Insecurity: No Food Insecurity (09/19/2023)   Hunger Vital Sign    Worried About Running Out of Food in the Last Year: Never true    Ran Out of Food in the Last Year: Never true  Transportation Needs: No Transportation Needs (09/19/2023)   PRAPARE - Administrator, Civil Service (Medical): No    Lack of Transportation (Non-Medical): No  Physical Activity: Sufficiently Active (08/29/2023)   Exercise Vital Sign    Days of Exercise per Week: 4 days    Minutes of Exercise per Session: 90 min  Stress: No Stress Concern Present (08/29/2023)   [employer] of Occupational Health - Occupational Stress Questionnaire    Feeling of Stress : Not at all  Social Connections: Socially Integrated (08/29/2023)   Social Connection and Isolation Panel    Frequency of Communication with Friends and Family: Three times a week    Frequency of Social Gatherings with Friends and Family: Twice a week    Attends Religious Services: More than 4 times per year    Active Member of Clubs or Organizations: Yes    Attends Banker Meetings: More than 4 times per year    Marital Status: Married  Catering manager Violence: Not At Risk (09/19/2023)   Humiliation, Afraid, Rape, and Kick questionnaire    Fear of Current or Ex-Partner: No    Emotionally Abused: No    Physically Abused: No    Sexually Abused: No    Lab Results  Component Value Date   HGBA1C 6.9 (A) 11/27/2023   HGBA1C 7.7 (H) 11/03/2023   HGBA1C 7.8 (A) 10/17/2023   Lab Results  Component Value Date   CHOL 107 02/15/2023   Lab Results  Component Value Date   HDL 40.90 02/15/2023   Lab Results  Component Value Date   LDLCALC 40 02/15/2023   Lab Results  Component Value Date   TRIG 129.0 02/15/2023   Lab Results  Component  Value Date   CHOLHDL 3 02/15/2023   Lab Results  Component Value Date   CREATININE 0.81 12/08/2023   Lab Results  Component Value Date   GFR 86.94 02/15/2023   Lab Results  Component Value Date   MICROALBUR <0.2 03/10/2020      Component Value Date/Time   NA 139 12/08/2023 1110   K 4.3 12/08/2023 1110   CL 103 12/08/2023 1110   CO2 24 12/08/2023 1110   GLUCOSE 175 (H) 12/08/2023 1110   BUN 19 12/08/2023 1110   CREATININE 0.81 12/08/2023 1110   CALCIUM  9.2 12/08/2023 1110   PROT 6.8 02/15/2023 0934   ALBUMIN 4.3 02/15/2023 0934   AST 19 02/15/2023 0934   ALT 19 02/15/2023 0934   ALKPHOS 67 02/15/2023 0934   BILITOT 1.7 (H) 02/15/2023 0934   GFRNONAA >60 12/08/2023 1110   GFRAA >60 09/25/2015 1039      Latest Ref Rng & Units 12/08/2023  11:10 AM 02/15/2023    9:34 AM 12/16/2021    7:20 AM  BMP  Glucose 70 - 99 mg/dL 824  765  821   BUN 8 - 23 mg/dL 19  26  22    Creatinine 0.61 - 1.24 mg/dL 9.18  9.12  9.08   Sodium 135 - 145 mmol/L 139  138  137   Potassium 3.5 - 5.1 mmol/L 4.3  3.9  4.3   Chloride 98 - 111 mmol/L 103  99  99   CO2 22 - 32 mmol/L 24  28  29    Calcium  8.9 - 10.3 mg/dL 9.2  9.2  9.1        Component Value Date/Time   WBC 7.5 03/27/2018 1557   RBC 5.13 03/27/2018 1557   HGB 15.8 03/27/2018 1557   HCT 44.7 03/27/2018 1557   PLT 193.0 03/27/2018 1557   MCV 87.2 03/27/2018 1557   MCH 30.1 09/25/2015 1039   MCHC 35.4 03/27/2018 1557   RDW 13.3 03/27/2018 1557   LYMPHSABS 2.0 03/27/2018 1557   MONOABS 0.6 03/27/2018 1557   EOSABS 0.0 03/27/2018 1557   BASOSABS 0.1 03/27/2018 1557     Parts of this note may have been dictated using voice recognition software. There may be variances in spelling and vocabulary which are unintentional. Not all errors are proofread. Please notify the dino if any discrepancies are noted or if the meaning of any statement is not clear.

## 2024-01-31 ENCOUNTER — Ambulatory Visit

## 2024-01-31 ENCOUNTER — Ambulatory Visit
Admission: RE | Admit: 2024-01-31 | Discharge: 2024-01-31 | Disposition: A | Discharge status: Home / Self Care | Source: Ambulatory Visit | Attending: Radiation Oncology | Admitting: Radiation Oncology

## 2024-01-31 ENCOUNTER — Other Ambulatory Visit (HOSPITAL_COMMUNITY): Payer: Self-pay

## 2024-01-31 ENCOUNTER — Other Ambulatory Visit: Payer: Self-pay

## 2024-01-31 ENCOUNTER — Telehealth: Payer: Self-pay | Admitting: Pharmacy Technician

## 2024-01-31 DIAGNOSIS — Z191 Hormone sensitive malignancy status: Secondary | ICD-10-CM | POA: Diagnosis not present

## 2024-01-31 DIAGNOSIS — C61 Malignant neoplasm of prostate: Secondary | ICD-10-CM | POA: Diagnosis not present

## 2024-01-31 DIAGNOSIS — Z510 Encounter for antineoplastic radiation therapy: Secondary | ICD-10-CM | POA: Diagnosis not present

## 2024-01-31 LAB — RAD ONC ARIA SESSION SUMMARY
Course Elapsed Days: 41
Plan Fractions Treated to Date: 27
Plan Prescribed Dose Per Fraction: 2.5 Gy
Plan Total Fractions Prescribed: 28
Plan Total Prescribed Dose: 70 Gy
Reference Point Dosage Given to Date: 67.5 Gy
Reference Point Session Dosage Given: 2.5 Gy
Session Number: 27

## 2024-01-31 NOTE — Telephone Encounter (Signed)
 Pharmacy Patient Advocate Encounter   Received notification from CoverMyMeds that prior authorization for Rybelsus  14MG  tablets is required/requested.   Insurance verification completed.   The patient is insured through [facility] ADVANTAGE/RX ADVANCE .   Per test claim: PA required; PA submitted to above mentioned insurance via CoverMyMeds Key/confirmation #/EOC [id number] Status is pending

## 2024-01-31 NOTE — Telephone Encounter (Signed)
 Pharmacy Patient Advocate Encounter  Received notification from [facility] ADVANTAGE/RX ADVANCE that Prior Authorization for Rybelsus  14MG  tablets has been APPROVED from 01/31/24 to 01/30/25. Ran test claim, Copay is $0.00. This test claim was processed through [facility]- copay amounts may vary at other pharmacies due to pharmacy/plan contracts, or as the patient moves through the different stages of their insurance plan.   PA #/Case ID/Reference #: [id number]

## 2024-02-01 ENCOUNTER — Ambulatory Visit
Admission: RE | Admit: 2024-02-01 | Discharge: 2024-02-01 | Disposition: A | Discharge status: Home / Self Care | Source: Ambulatory Visit | Attending: Radiation Oncology | Admitting: Radiation Oncology

## 2024-02-01 ENCOUNTER — Other Ambulatory Visit: Payer: Self-pay

## 2024-02-01 DIAGNOSIS — C61 Malignant neoplasm of prostate: Secondary | ICD-10-CM | POA: Diagnosis not present

## 2024-02-01 DIAGNOSIS — Z191 Hormone sensitive malignancy status: Secondary | ICD-10-CM | POA: Diagnosis not present

## 2024-02-01 DIAGNOSIS — Z510 Encounter for antineoplastic radiation therapy: Secondary | ICD-10-CM | POA: Diagnosis not present

## 2024-02-01 LAB — RAD ONC ARIA SESSION SUMMARY
Course Elapsed Days: 42
Plan Fractions Treated to Date: 28
Plan Prescribed Dose Per Fraction: 2.5 Gy
Plan Total Fractions Prescribed: 28
Plan Total Prescribed Dose: 70 Gy
Reference Point Dosage Given to Date: 70 Gy
Reference Point Session Dosage Given: 2.5 Gy
Session Number: 28

## 2024-02-02 DIAGNOSIS — M1611 Unilateral primary osteoarthritis, right hip: Secondary | ICD-10-CM | POA: Diagnosis not present

## 2024-02-02 NOTE — Radiation Completion Notes (Addendum)
  Radiation Oncology         [phone]) [phone] ________________________________  Name: Aaron Bradshaw MRN: [id number]  Date: 02/01/2024  DOB: Sep 21, 1950  Referring Physician: BENJAMIN HERRICK, M.D. Date of Service: 2024-02-02 Radiation Oncologist: Adina Barge, M.D. [facility]     RADIATION ONCOLOGY END OF TREATMENT NOTE     Diagnosis:  73 y.o. gentleman with Stage T1c adenocarcinoma of the prostate with Gleason score of 4+3, and PSA of 4.3.   Intent: Curative     ==========DELIVERED PLANS==========  First Treatment Date: 2023-12-21 Last Treatment Date: 2024-02-01   Plan Name: Prostate Site: Prostate Technique: IMRT Mode: Photon Dose Per Fraction: 2.5 Gy Prescribed Dose (Delivered / Prescribed): 70 Gy / 70 Gy Prescribed Fxs (Delivered / Prescribed): 28 / 28     ==========ON TREATMENT VISIT DATES========== 2023-12-22, 2023-12-29, 2024-01-05, 2024-01-12, 2024-01-23, 2024-02-01    See weekly On Treatment Notes in Epic for details in the Media tab (listed as Progress notes on the On Treatment Visit Dates listed above).  He tolerated the radiation treatments relatively well with increased LUTS that were managed with Flomax .  He also reported modest fatigue.  The patient will receive a call in about one month from the radiation oncology department. He will continue follow up with his urologist, Dr. Cam, the as well.  ------------------------------------------------   Donnice Barge, MD [facility]  Radiation Oncology Direct Dial: [phone]  Fax: [phone] [email or web address]  Skype  LinkedIn

## 2024-02-05 DIAGNOSIS — J3081 Allergic rhinitis due to animal (cat) (dog) hair and dander: Secondary | ICD-10-CM | POA: Diagnosis not present

## 2024-02-05 DIAGNOSIS — J3089 Other allergic rhinitis: Secondary | ICD-10-CM | POA: Diagnosis not present

## 2024-02-05 DIAGNOSIS — J301 Allergic rhinitis due to pollen: Secondary | ICD-10-CM | POA: Diagnosis not present

## 2024-02-06 ENCOUNTER — Other Ambulatory Visit

## 2024-02-06 DIAGNOSIS — E119 Type 2 diabetes mellitus without complications: Secondary | ICD-10-CM | POA: Diagnosis not present

## 2024-02-06 NOTE — Progress Notes (Signed)
 Patient was a RadOnc Consult on 09/19/23 for his stage T1c adenocarcinoma of the prostate with Gleason score of 4+3, and PSA of 4.3. Patient proceed with treatment recommendations of 5 1/2 weeks of daily radiation and had his final radiation treatment on 7/10.   Patient is scheduled for a post treatment nurse call on 03/05/24 and has his first post treatment PSA on 05/02/24 at [facility].

## 2024-02-07 LAB — COMPREHENSIVE METABOLIC PANEL WITH GFR
AG Ratio: 1.8 (calc) (ref 1.0–2.5)
ALT: 16 U/L (ref 9–46)
AST: 15 U/L (ref 10–35)
Albumin: 4.2 g/dL (ref 3.6–5.1)
Alkaline phosphatase (APISO): 81 U/L (ref 35–144)
BUN: 24 mg/dL (ref 7–25)
CO2: 29 mmol/L (ref 20–32)
Calcium: 9 mg/dL (ref 8.6–10.3)
Chloride: 102 mmol/L (ref 98–110)
Creat: 0.77 mg/dL (ref 0.70–1.28)
Globulin: 2.4 g/dL (ref 1.9–3.7)
Glucose, Bld: 162 mg/dL — ABNORMAL HIGH (ref 65–99)
Potassium: 4.6 mmol/L (ref 3.5–5.3)
Sodium: 138 mmol/L (ref 135–146)
Total Bilirubin: 1.1 mg/dL (ref 0.2–1.2)
Total Protein: 6.6 g/dL (ref 6.1–8.1)
eGFR: 95 mL/min/1.73m2 (ref >=60)

## 2024-02-07 LAB — MICROALBUMIN / CREATININE URINE RATIO
Creatinine, Urine: 72 mg/dL (ref 20–320)
Microalb Creat Ratio: 3 mg/g{creat} (ref <30)
Microalb, Ur: 0.2 mg/dL

## 2024-02-07 LAB — LIPID PANEL
Cholesterol: 104 mg/dL (ref <200)
HDL: 41 mg/dL (ref >=40)
LDL Cholesterol (Calc): 45 mg/dL
Non-HDL Cholesterol (Calc): 63 mg/dL (ref <130)
Total CHOL/HDL Ratio: 2.5 (calc) (ref <5.0)
Triglycerides: 93 mg/dL (ref <150)

## 2024-02-15 DIAGNOSIS — J3081 Allergic rhinitis due to animal (cat) (dog) hair and dander: Secondary | ICD-10-CM | POA: Diagnosis not present

## 2024-02-15 DIAGNOSIS — J301 Allergic rhinitis due to pollen: Secondary | ICD-10-CM | POA: Diagnosis not present

## 2024-02-15 DIAGNOSIS — J3089 Other allergic rhinitis: Secondary | ICD-10-CM | POA: Diagnosis not present

## 2024-02-21 DIAGNOSIS — L570 Actinic keratosis: Secondary | ICD-10-CM | POA: Diagnosis not present

## 2024-02-21 DIAGNOSIS — D2272 Melanocytic nevi of left lower limb, including hip: Secondary | ICD-10-CM | POA: Diagnosis not present

## 2024-02-21 DIAGNOSIS — Z85828 Personal history of other malignant neoplasm of skin: Secondary | ICD-10-CM | POA: Diagnosis not present

## 2024-02-21 DIAGNOSIS — D485 Neoplasm of uncertain behavior of skin: Secondary | ICD-10-CM | POA: Diagnosis not present

## 2024-02-21 DIAGNOSIS — D2271 Melanocytic nevi of right lower limb, including hip: Secondary | ICD-10-CM | POA: Diagnosis not present

## 2024-02-21 DIAGNOSIS — C44229 Squamous cell carcinoma of skin of left ear and external auricular canal: Secondary | ICD-10-CM | POA: Diagnosis not present

## 2024-02-21 DIAGNOSIS — L821 Other seborrheic keratosis: Secondary | ICD-10-CM | POA: Diagnosis not present

## 2024-02-21 DIAGNOSIS — L814 Other melanin hyperpigmentation: Secondary | ICD-10-CM | POA: Diagnosis not present

## 2024-02-23 DIAGNOSIS — J301 Allergic rhinitis due to pollen: Secondary | ICD-10-CM | POA: Diagnosis not present

## 2024-02-23 DIAGNOSIS — J3081 Allergic rhinitis due to animal (cat) (dog) hair and dander: Secondary | ICD-10-CM | POA: Diagnosis not present

## 2024-02-23 DIAGNOSIS — J3089 Other allergic rhinitis: Secondary | ICD-10-CM | POA: Diagnosis not present

## 2024-02-24 ENCOUNTER — Encounter: Payer: Self-pay | Admitting: Internal Medicine

## 2024-02-27 ENCOUNTER — Ambulatory Visit: Admitting: Family Medicine

## 2024-02-27 VITALS — BP 114/60 | HR 67 | Temp 97.9°F | Wt 196.0 lb

## 2024-02-27 DIAGNOSIS — E785 Hyperlipidemia, unspecified: Secondary | ICD-10-CM | POA: Diagnosis not present

## 2024-02-27 DIAGNOSIS — E039 Hypothyroidism, unspecified: Secondary | ICD-10-CM | POA: Diagnosis not present

## 2024-02-27 DIAGNOSIS — Z7984 Long term (current) use of oral hypoglycemic drugs: Secondary | ICD-10-CM

## 2024-02-27 DIAGNOSIS — E1165 Type 2 diabetes mellitus with hyperglycemia: Secondary | ICD-10-CM

## 2024-02-27 DIAGNOSIS — C61 Malignant neoplasm of prostate: Secondary | ICD-10-CM | POA: Diagnosis not present

## 2024-02-27 LAB — POCT GLYCOSYLATED HEMOGLOBIN (HGB A1C): Hemoglobin A1C: 7.5 % — AB (ref 4.0–5.6)

## 2024-02-27 NOTE — Progress Notes (Addendum)
 Established Patient Office Visit  Subjective   Patient ID: Aaron Bradshaw, male    DOB: [birth year]  Age: 73 y.o. MRN: [id number]  Chief Complaint  Patient presents with   Medical Management of Chronic Issues    HPI   Aaron Bradshaw is seen for 56-month follow-up.  He has history of hypothyroidism, type 2 diabetes, recently diagnosed prostate cancer, osteoarthritis with pending right hip replacement September 4.  Just finished radiation therapy for his prostate cancer.  That went well overall.  Did have some fatigue.  He has quit working at this point.  Scheduled hip surgery September 4.  Recently he established with endocrine.  They apparently discussed switching him to Rybelsus  from Ozempic  but he has not started the Rybelsus  at this time.  Still takes Ozempic  0.5 mg subcutaneous once weekly along with Jardiance  and metformin .  Last A1c had improved to 6.9%.  He still been very active with exercise in preparation for his hip surgery.  He has been diligent with doing lower extremity exercises to be prepared for his surgery.  Takes rosuvastatin  10 mg daily for hyperlipidemia.  Levothyroxine  75 mcg daily for hypothyroidism.  He had recent labs done per endocrine and these were reviewed.  Microalbumin creatinine ratio normal.  CMP unremarkable except for glucose of 162.  Cholesterol 104 with LDL 45.  Has had some recent constipation issues.  No bloody stools.  No opioid use.  Last colonoscopy 2020.  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   Past Surgical History:  Procedure Laterality Date   APPENDECTOMY     CHOLECYSTECTOMY  2010   COLONOSCOPY     Tics, Polyp 12/26/2003; polyps 2010, Dr Avram   EYE SURGERY  retina   GOLD SEED IMPLANT N/A 12/08/2023   Procedure: INSERTION, GOLD SEEDS;  Surgeon: Alvaro Ricardo KATHEE Mickey., MD;  Location: [facility] OR;  Service: Urology;  Laterality: N/A;  GOLD SEEDS WITH SPACE OAR   KNEE ARTHROSCOPY     Left   LAPAROSCOPIC APPENDECTOMY N/A 10/05/2015   Procedure: APPENDECTOMY LAPAROSCOPIC;  Surgeon: Lynwood Pina, MD;  Location: [facility] OR;  Service: General;  Laterality: N/A;   NOSE SURGERY     Dr  Jama   POLYPECTOMY     ROTATOR CUFF REPAIR Bilateral    left   SHOULDER ARTHROSCOPY     SPACE OAR INSTILLATION N/A 12/08/2023   Procedure: INJECTION, HYDROGEL SPACER;  Surgeon: Alvaro Ricardo KATHEE Mickey., MD;  Location: [facility] OR;  Service: Urology;  Laterality: N/A;    reports that he has never smoked. He has never used smokeless tobacco. He reports current alcohol use of about 1.0 - 2.0 standard drink of alcohol per week. He reports that he does not use drugs. family history includes Arthritis in his mother; Cancer in  his father; Depression in his father, mother, and sister; Diabetes in his maternal grandfather and mother; Heart disease in his father; OCD in his father; Parkinsonism in his mother; Prostate cancer in his father and paternal grandfather; Stroke (age of onset: 71) in his mother. Allergies  Allergen Reactions   Codeine Nausea Only   Prozac  [Fluoxetine  Hcl] Anxiety    Increased anxiety    Review of Systems  Constitutional:  Negative for chills, fever and malaise/fatigue.  Eyes:  Negative for blurred vision.  Respiratory:  Negative for shortness of breath.   Cardiovascular:  Negative for chest pain.  Gastrointestinal:  Positive for constipation. Negative for nausea and vomiting.  Neurological:  Negative for dizziness, weakness and headaches.       Objective:     BP 114/60   Pulse 67   Temp 97.9 F (36.6 C) (Oral)   Wt 196 lb (88.9 kg)   SpO2 96%   BMI 24.50 kg/m  BP Readings from Last 3 Encounters:  02/27/24 114/60  01/30/24 122/80  12/08/23 (!) 141/82   Wt Readings from Last 3 Encounters:  02/27/24 196 lb (88.9 kg)  01/30/24 196 lb (88.9 kg)  12/08/23 193 lb (87.5 kg)      Physical Exam Vitals reviewed.  Constitutional:      General: He is not in acute distress.    Appearance: He is well-developed.  HENT:     Right Ear: External ear normal.     Left Ear: External ear normal.  Eyes:     Pupils: Pupils are equal, round, and reactive to light.  Neck:     Thyroid : No thyromegaly.  Cardiovascular:     Rate and Rhythm: Normal rate and regular rhythm.  Pulmonary:     Effort: Pulmonary effort is normal. No respiratory distress.     Breath sounds: Normal breath sounds. No wheezing or rales.  Musculoskeletal:     Cervical back: Neck supple.  Neurological:     Mental Status: He is alert and oriented to person, place, and time.      Results for orders placed or performed in visit on 02/27/24  POC HgB A1c  Result Value Ref Range   Hemoglobin A1C 7.5 (A) 4.0 - 5.6 %   HbA1c POC (<> result, manual entry)     HbA1c, POC (prediabetic range)     HbA1c, POC (controlled diabetic range)      Last CBC Lab Results  Component Value Date   WBC 7.5 03/27/2018   HGB 15.8 03/27/2018   HCT 44.7 03/27/2018   MCV 87.2 03/27/2018   MCH 30.1 09/25/2015   RDW 13.3 03/27/2018   PLT 193.0 03/27/2018   Last metabolic panel Lab Results  Component Value Date   GLUCOSE 162 (H) 02/06/2024   NA 138 02/06/2024   K 4.6 02/06/2024   CL 102 02/06/2024   CO2 29 02/06/2024   BUN 24 02/06/2024   CREATININE 0.77 02/06/2024   EGFR 95 02/06/2024   CALCIUM  9.0 02/06/2024   PROT 6.6 02/06/2024   ALBUMIN 4.3 02/15/2023   BILITOT 1.1 02/06/2024   ALKPHOS 67 02/15/2023   AST 15 02/06/2024   ALT 16 02/06/2024   ANIONGAP 12  12/08/2023   Last lipids Lab Results  Component Value Date   CHOL 104 02/06/2024   HDL 41 02/06/2024   LDLCALC 45 02/06/2024   LDLDIRECT 112.0 01/03/2017   TRIG 93 02/06/2024   CHOLHDL 2.5 02/06/2024   Last hemoglobin A1c Lab Results  Component Value  Date   HGBA1C 7.5 (A) 02/27/2024   Last thyroid  functions Lab Results  Component Value Date   TSH 2.62 02/15/2023      The ASCVD Risk score (Arnett DK, et al., 2019) failed to calculate for the following reasons:   The valid total cholesterol range is 130 to 320 mg/dL    Assessment & Plan:   #1 type 2 diabetes.  A1c today 7.5%.  He did recently establish with endocrinology but does not have follow-up until January.  He had requested A1c today.  They had discussed switching him to Rybelsus  but he seems reluctant and plans to consider staying on Ozempic .  Continue lower glycemic diet especially in preparation for his surgery  #2 hyperlipidemia.  Treated with Crestor .  Recent lipids at goal.  #3 hypothyroidism.  Is due for follow-up thyroid  labs and we will plan to get with next blood draw.  #4 constipation.  Continue plenty of fluids and at least 30 g fiber per day as a goal.  Try over-the-counter MiraLAX 17 g daily as needed  #5 prostate cancer- s/p radiation and overall doing well.   Had some fatigue but improving.    Wolm Scarlet, MD

## 2024-02-27 NOTE — Patient Instructions (Signed)
 A1C today 7.5%.

## 2024-03-04 ENCOUNTER — Telehealth: Payer: Self-pay | Admitting: Radiation Oncology

## 2024-03-04 NOTE — Progress Notes (Signed)
  Radiation Oncology         [phone]) [phone] ________________________________  Name: Aaron Bradshaw MRN: [id number]  Date of Service: 03/05/2024  DOB: 06-Mar-1951  Post Treatment Telephone Note  Diagnosis:  Malignant neoplasm of prostate   Pre Treatment IPSS Score: 11  The patient was available for call today.   Symptoms of fatigue have improved since completing therapy.  Symptoms of bladder changes have improved since completing therapy. Current symptoms include some urinary frequency and nocturia x q2h, and medications for bladder symptoms include Tamsulosin .  Symptoms of bowel changes have improved since completing therapy. Current symptoms include constipation, and medications for bowel symptoms include OTC laxatives/stool softeners.   Post Treatment IPSS Score: IPSS Questionnaire (AUA-7): Over the past month.   1)  How often have you had a sensation of not emptying your bladder completely after you finish urinating?  3 - About half the time  2)  How often have you had to urinate again less than two hours after you finished urinating? 0 - Not at all  3)  How often have you found you stopped and started again several times when you urinated?  1 - Less than 1 time in 5  4) How difficult have you found it to postpone urination?  1 - Less than 1 time in 5  5) How often have you had a weak urinary stream?  0 - Not at all  6) How often have you had to push or strain to begin urination?  0 - Not at all  7) How many times did you most typically get up to urinate from the time you went to bed until the time you got up in the morning?  3 - 3 times  Total score:  8. Which indicates moderate symptoms  0-7 mildly symptomatic   8-19 moderately symptomatic   20-35 severely symptomatic   Patient does not currently have any scheduled follow up with his urologist to his knowledge so he was advised to call [facility] to schedule his post-treatment follow up with Dr. Morene Salines for  ongoing surveillance. He was counseled that PSA levels will be drawn in the urology office, and was reassured that additional time is expected to improve bowel and bladder symptoms. He was encouraged to call back with concerns or questions regarding radiation.

## 2024-03-04 NOTE — Telephone Encounter (Signed)
 8/11 Fax Medical Record to Dr. Maude MATSU. Dalldorf/[facility]na [facility] for continue of care.

## 2024-03-05 ENCOUNTER — Ambulatory Visit
Admission: RE | Admit: 2024-03-05 | Discharge: 2024-03-05 | Disposition: A | Discharge status: Home / Self Care | Source: Ambulatory Visit | Attending: Internal Medicine | Admitting: Internal Medicine

## 2024-03-05 DIAGNOSIS — Z510 Encounter for antineoplastic radiation therapy: Secondary | ICD-10-CM | POA: Insufficient documentation

## 2024-03-05 DIAGNOSIS — C61 Malignant neoplasm of prostate: Secondary | ICD-10-CM | POA: Insufficient documentation

## 2024-03-07 DIAGNOSIS — J3081 Allergic rhinitis due to animal (cat) (dog) hair and dander: Secondary | ICD-10-CM | POA: Diagnosis not present

## 2024-03-07 DIAGNOSIS — J3089 Other allergic rhinitis: Secondary | ICD-10-CM | POA: Diagnosis not present

## 2024-03-07 DIAGNOSIS — J301 Allergic rhinitis due to pollen: Secondary | ICD-10-CM | POA: Diagnosis not present

## 2024-03-11 ENCOUNTER — Telehealth: Payer: Self-pay

## 2024-03-11 NOTE — Telephone Encounter (Signed)
 Copied from CRM #[id number]. Topic: Clinical - Request for Lab/Test Order >> Mar 11, 2024  9:40 AM Burnard DEL wrote: Reason for CRM: Dagoberto from [facility] would ike to know if labs from 8/5 and 02/06/2024 could be faxed over to their office.She stated that patient has an appointment with them on Wednesday and she doesn't want to  repeat labs that have already been don recently. Fax#:[phone]

## 2024-03-11 NOTE — Telephone Encounter (Signed)
 Results faxed.

## 2024-03-12 DIAGNOSIS — M25651 Stiffness of right hip, not elsewhere classified: Secondary | ICD-10-CM | POA: Diagnosis not present

## 2024-03-12 DIAGNOSIS — M1611 Unilateral primary osteoarthritis, right hip: Secondary | ICD-10-CM | POA: Diagnosis not present

## 2024-03-13 DIAGNOSIS — M25551 Pain in right hip: Secondary | ICD-10-CM | POA: Diagnosis not present

## 2024-03-13 DIAGNOSIS — M1611 Unilateral primary osteoarthritis, right hip: Secondary | ICD-10-CM | POA: Diagnosis not present

## 2024-03-14 ENCOUNTER — Encounter: Payer: Self-pay | Admitting: Internal Medicine

## 2024-03-14 DIAGNOSIS — J301 Allergic rhinitis due to pollen: Secondary | ICD-10-CM | POA: Diagnosis not present

## 2024-03-14 DIAGNOSIS — J3089 Other allergic rhinitis: Secondary | ICD-10-CM | POA: Diagnosis not present

## 2024-03-14 DIAGNOSIS — J3081 Allergic rhinitis due to animal (cat) (dog) hair and dander: Secondary | ICD-10-CM | POA: Diagnosis not present

## 2024-03-28 DIAGNOSIS — Z96641 Presence of right artificial hip joint: Secondary | ICD-10-CM | POA: Diagnosis not present

## 2024-03-28 DIAGNOSIS — M1611 Unilateral primary osteoarthritis, right hip: Secondary | ICD-10-CM | POA: Diagnosis not present

## 2024-04-01 DIAGNOSIS — Z96641 Presence of right artificial hip joint: Secondary | ICD-10-CM | POA: Diagnosis not present

## 2024-04-01 DIAGNOSIS — M6281 Muscle weakness (generalized): Secondary | ICD-10-CM | POA: Diagnosis not present

## 2024-04-01 DIAGNOSIS — M25651 Stiffness of right hip, not elsewhere classified: Secondary | ICD-10-CM | POA: Diagnosis not present

## 2024-04-05 DIAGNOSIS — M25651 Stiffness of right hip, not elsewhere classified: Secondary | ICD-10-CM | POA: Diagnosis not present

## 2024-04-05 DIAGNOSIS — M6281 Muscle weakness (generalized): Secondary | ICD-10-CM | POA: Diagnosis not present

## 2024-04-05 DIAGNOSIS — Z96641 Presence of right artificial hip joint: Secondary | ICD-10-CM | POA: Diagnosis not present

## 2024-04-08 DIAGNOSIS — M25551 Pain in right hip: Secondary | ICD-10-CM | POA: Diagnosis not present

## 2024-04-10 DIAGNOSIS — M25651 Stiffness of right hip, not elsewhere classified: Secondary | ICD-10-CM | POA: Diagnosis not present

## 2024-04-10 DIAGNOSIS — J3081 Allergic rhinitis due to animal (cat) (dog) hair and dander: Secondary | ICD-10-CM | POA: Diagnosis not present

## 2024-04-10 DIAGNOSIS — J3089 Other allergic rhinitis: Secondary | ICD-10-CM | POA: Diagnosis not present

## 2024-04-10 DIAGNOSIS — M6281 Muscle weakness (generalized): Secondary | ICD-10-CM | POA: Diagnosis not present

## 2024-04-10 DIAGNOSIS — Z96641 Presence of right artificial hip joint: Secondary | ICD-10-CM | POA: Diagnosis not present

## 2024-04-10 DIAGNOSIS — J301 Allergic rhinitis due to pollen: Secondary | ICD-10-CM | POA: Diagnosis not present

## 2024-04-16 ENCOUNTER — Other Ambulatory Visit: Payer: Self-pay | Admitting: Family Medicine

## 2024-04-17 DIAGNOSIS — M6281 Muscle weakness (generalized): Secondary | ICD-10-CM | POA: Diagnosis not present

## 2024-04-17 DIAGNOSIS — M25651 Stiffness of right hip, not elsewhere classified: Secondary | ICD-10-CM | POA: Diagnosis not present

## 2024-04-17 DIAGNOSIS — J3081 Allergic rhinitis due to animal (cat) (dog) hair and dander: Secondary | ICD-10-CM | POA: Diagnosis not present

## 2024-04-17 DIAGNOSIS — J3089 Other allergic rhinitis: Secondary | ICD-10-CM | POA: Diagnosis not present

## 2024-04-17 DIAGNOSIS — J301 Allergic rhinitis due to pollen: Secondary | ICD-10-CM | POA: Diagnosis not present

## 2024-04-17 DIAGNOSIS — Z96641 Presence of right artificial hip joint: Secondary | ICD-10-CM | POA: Diagnosis not present

## 2024-04-19 DIAGNOSIS — Z96641 Presence of right artificial hip joint: Secondary | ICD-10-CM | POA: Diagnosis not present

## 2024-04-19 DIAGNOSIS — M25651 Stiffness of right hip, not elsewhere classified: Secondary | ICD-10-CM | POA: Diagnosis not present

## 2024-04-19 DIAGNOSIS — M6281 Muscle weakness (generalized): Secondary | ICD-10-CM | POA: Diagnosis not present

## 2024-04-22 ENCOUNTER — Other Ambulatory Visit: Payer: Self-pay | Admitting: Family Medicine

## 2024-04-22 DIAGNOSIS — Z96641 Presence of right artificial hip joint: Secondary | ICD-10-CM | POA: Diagnosis not present

## 2024-04-22 DIAGNOSIS — M25651 Stiffness of right hip, not elsewhere classified: Secondary | ICD-10-CM | POA: Diagnosis not present

## 2024-04-22 DIAGNOSIS — M6281 Muscle weakness (generalized): Secondary | ICD-10-CM | POA: Diagnosis not present

## 2024-04-23 ENCOUNTER — Ambulatory Visit: Payer: Self-pay

## 2024-04-23 NOTE — Telephone Encounter (Signed)
 FYI Only or Action Required?: FYI only for provider.  Patient was last seen in primary care on 02/27/2024 by Micheal Wolm ORN, MD.  Called Nurse Triage reporting Urinary Frequency.  Symptoms began several days ago.  Interventions attempted: Rest, hydration, or home remedies.  Symptoms are: gradually worsening.  Triage Disposition: See Physician Within 24 Hours  Patient/caregiver understands and will follow disposition?: Yes  Copied from CRM #[id number]. Topic: Clinical - Red Word Triage >> Apr 23, 2024 10:49 AM Terri MATSU wrote: Red Word that prompted transfer to Nurse Triage: Patient has been experiencing burning with urination. Reason for Disposition  All other males with painful urination  Answer Assessment - Initial Assessment Questions Recent hip surgery; but over 3-4 days has noticed an increase in urinary frequency as well as pain/burning with urination. Denies uti hx.   1. SEVERITY: How bad is the pain?  (e.g., Scale 1-10; mild, moderate, or severe)     2/10 2. FREQUENCY: How many times have you had painful urination today?      4-5 3. PATTERN: Is pain present every time you urinate or just sometimes?      Every time 4. ONSET: When did the painful urination start?      3-4 days ago  5. FEVER: Do you have a fever? If Yes, ask: What is your temperature, how was it measured, and when did it start?     denies 6. PAST UTI: Have you had a urine infection before? If Yes, ask: When was the last time? and What happened that time?      denies 7. CAUSE: What do you think is causing the painful urination?      unsure 8. OTHER SYMPTOMS: Do you have any other symptoms? (e.g., flank pain, penis discharge, scrotal pain, blood in urine)     Denies  Protocols used: Urination Pain - Male-A-AH

## 2024-04-24 ENCOUNTER — Ambulatory Visit

## 2024-04-24 ENCOUNTER — Ambulatory Visit (INDEPENDENT_AMBULATORY_CARE_PROVIDER_SITE_OTHER): Admitting: Internal Medicine

## 2024-04-24 ENCOUNTER — Encounter: Payer: Self-pay | Admitting: Internal Medicine

## 2024-04-24 VITALS — BP 110/70 | HR 70 | Temp 98.2°F | Wt 194.1 lb

## 2024-04-24 DIAGNOSIS — J3081 Allergic rhinitis due to animal (cat) (dog) hair and dander: Secondary | ICD-10-CM | POA: Diagnosis not present

## 2024-04-24 DIAGNOSIS — M25651 Stiffness of right hip, not elsewhere classified: Secondary | ICD-10-CM | POA: Diagnosis not present

## 2024-04-24 DIAGNOSIS — R350 Frequency of micturition: Secondary | ICD-10-CM | POA: Diagnosis not present

## 2024-04-24 DIAGNOSIS — J3089 Other allergic rhinitis: Secondary | ICD-10-CM | POA: Diagnosis not present

## 2024-04-24 DIAGNOSIS — Z23 Encounter for immunization: Secondary | ICD-10-CM

## 2024-04-24 DIAGNOSIS — J301 Allergic rhinitis due to pollen: Secondary | ICD-10-CM | POA: Diagnosis not present

## 2024-04-24 DIAGNOSIS — Z96641 Presence of right artificial hip joint: Secondary | ICD-10-CM | POA: Diagnosis not present

## 2024-04-24 DIAGNOSIS — M6281 Muscle weakness (generalized): Secondary | ICD-10-CM | POA: Diagnosis not present

## 2024-04-24 LAB — POC URINALSYSI DIPSTICK (AUTOMATED)
Bilirubin, UA: NEGATIVE
Blood, UA: NEGATIVE
Glucose, UA: POSITIVE — AB
Ketones, UA: NEGATIVE
Leukocytes, UA: NEGATIVE
Nitrite, UA: NEGATIVE
Protein, UA: NEGATIVE
Spec Grav, UA: 1.02 (ref 1.010–1.025)
Urobilinogen, UA: 0.2 U/dL
pH, UA: 5 (ref 5.0–8.0)

## 2024-04-24 NOTE — Progress Notes (Signed)
 Established Patient Office Visit     CC/Reason for Visit: Urinary frequency  HPI: Aaron Bradshaw is a 73 y.o. male who is coming in today for the above mentioned reasons.  For the past 4 to 5 days has noticed an increase in urinary frequency.  Maybe some mild dysuria.  It is important to note that he had a radiation treatment for his prostate cancer over the summer as well as a right hip replacement.  He is on Jardiance  for treatment of his diabetes.   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 OR;  Service: General;  Laterality: N/A;   NOSE SURGERY     Dr  Jama   POLYPECTOMY     ROTATOR CUFF REPAIR Bilateral    left   SHOULDER ARTHROSCOPY     SPACE OAR INSTILLATION N/A 12/08/2023   Procedure: INJECTION, HYDROGEL SPACER;  Surgeon: Alvaro Ricardo KATHEE Mickey., MD;  Location: [facility] OR;  Service: Urology;  Laterality: N/A;    Social  History:  reports that he has never smoked. He has never used smokeless tobacco. He reports current alcohol use of about 1.0 - 2.0 standard drink of alcohol per week. He reports that he does not use drugs.  Allergies: Allergies  Allergen Reactions   Codeine Nausea Only   Prozac  [Fluoxetine  Hcl] Anxiety    Increased anxiety    Family History:  Family History  Problem Relation Age of Onset   Depression Father    Heart disease Father        MI in 47s   Prostate cancer Father    OCD Father    Cancer Father    Depression Mother    Arthritis Mother    Stroke Mother 57   Parkinsonism Mother    Diabetes Mother    Depression Sister        OCD   Prostate cancer Paternal Grandfather    Diabetes Maternal Grandfather        MI in 74s   Colon cancer Neg Hx    Colon polyps Neg Hx    Esophageal cancer Neg Hx    Rectal cancer Neg  Hx    Stomach cancer Neg Hx      Current Outpatient Medications:    Ascorbic Acid (VITAMIN C) 250 MG tablet, Take 250 mg by mouth daily., Disp: , Rfl:    b complex vitamins capsule, Take 1 capsule by mouth daily., Disp: , Rfl:    CHOLECALCIFEROL PO, Take 1 tablet by mouth daily., Disp: , Rfl:    co-enzyme Q-10 30 MG capsule, Take 30 mg by mouth daily., Disp: , Rfl:    Continuous Glucose Receiver (FREESTYLE LIBRE 3 READER) DEVI, 1 each by Does not apply route daily at 2 PM., Disp: 2 each, Rfl: 1   Continuous Glucose Sensor (FREESTYLE LIBRE 3 SENSOR) MISC, Use as directed to check blood sugar daily, Disp: 2 each, Rfl: 1   EPIPEN  2-PAK 0.3 MG/0.3ML SOAJ injection, USE AS DIRECTED AS NEEDED, Disp: , Rfl: 1   glucose blood (ONETOUCH VERIO) test strip, Use as instructed once a day, Disp: 100 each, Rfl: 3   JARDIANCE  25 MG TABS tablet, TAKE 1 TABLET BY MOUTH EVERY DAY BEFORE BREAKFAST, Disp: 30 tablet, Rfl: 5   KRILL OIL PO, Take 1 capsule by mouth daily., Disp: , Rfl:    levothyroxine  (SYNTHROID ) 75 MCG tablet, TAKE 1 TABLET BY MOUTH EVERY DAY, Disp: 30 tablet,  Rfl: 1   MAGNESIUM PO, Take 1 tablet by mouth daily., Disp: , Rfl:    metFORMIN  (GLUCOPHAGE -XR) 750 MG 24 hr tablet, TAKE 1 TABLET BY MOUTH TWICE A DAY, Disp: 180 tablet, Rfl: 1   Omega-3 Fatty Acids (FISH OIL) 1000 MG CAPS, Take by mouth., Disp: , Rfl:    OneTouch Delica Lancets 33G MISC, Use as directed once a day, Disp: 100 each, Rfl: 3   PRESCRIPTION MEDICATION, Inject 1 each into the skin every 7 (seven) days. Patient gets an allergy shot once a week at [employer] off of [address], Disp: , Rfl:    Probiotic Product (PROBIOTIC DAILY PO), Take by mouth daily., Disp: , Rfl:    rosuvastatin  (CRESTOR ) 10 MG tablet, TAKE 1 TABLET BY MOUTH EVERY DAY, Disp: 90 tablet, Rfl: 1   Semaglutide  (RYBELSUS ) 14 MG TABS, Take 1 tablet (14 mg total) by mouth daily., Disp: 90 tablet, Rfl: 1   tamsulosin  (FLOMAX ) 0.4 MG CAPS capsule, Take 1 capsule (0.4 mg total) by mouth daily after supper., Disp: 30 capsule, Rfl: 5  Review of Systems:  Negative unless indicated in HPI.   Physical Exam: Vitals:   04/24/24 1343  BP: 110/70  Pulse: 70  Temp: 98.2 F (36.8 C)  TempSrc: Oral  SpO2: 97%  Weight: 194 lb 1.6 oz (88 kg)    Body mass index is 24.26 kg/m.    Impression and Plan:  Frequency of urination -     POCT Urinalysis Dipstick (Automated)  Immunization due   - In office urine dipstick is negative for signs of infections but does have 3+ glucose.  Suspect that this is osmotic diuresis on the account of SGLT2.  No need for antibiotics. - Flu vaccine administered in office today.  Time spent:22 minutes reviewing chart, interviewing and examining patient and formulating plan of care.     Tully Theophilus Andrews, MD [facility]

## 2024-04-24 NOTE — Addendum Note (Signed)
 Addended by: KATHRYNE MILLMAN B on: 04/24/2024 04:55 PM   Modules accepted: Orders

## 2024-04-30 DIAGNOSIS — M25651 Stiffness of right hip, not elsewhere classified: Secondary | ICD-10-CM | POA: Diagnosis not present

## 2024-04-30 DIAGNOSIS — Z96641 Presence of right artificial hip joint: Secondary | ICD-10-CM | POA: Diagnosis not present

## 2024-04-30 DIAGNOSIS — M6281 Muscle weakness (generalized): Secondary | ICD-10-CM | POA: Diagnosis not present

## 2024-05-02 DIAGNOSIS — Z96641 Presence of right artificial hip joint: Secondary | ICD-10-CM | POA: Diagnosis not present

## 2024-05-02 DIAGNOSIS — C61 Malignant neoplasm of prostate: Secondary | ICD-10-CM | POA: Diagnosis not present

## 2024-05-02 DIAGNOSIS — M25651 Stiffness of right hip, not elsewhere classified: Secondary | ICD-10-CM | POA: Diagnosis not present

## 2024-05-02 DIAGNOSIS — M6281 Muscle weakness (generalized): Secondary | ICD-10-CM | POA: Diagnosis not present

## 2024-05-06 DIAGNOSIS — M25651 Stiffness of right hip, not elsewhere classified: Secondary | ICD-10-CM | POA: Diagnosis not present

## 2024-05-06 DIAGNOSIS — M6281 Muscle weakness (generalized): Secondary | ICD-10-CM | POA: Diagnosis not present

## 2024-05-06 DIAGNOSIS — Z96641 Presence of right artificial hip joint: Secondary | ICD-10-CM | POA: Diagnosis not present

## 2024-05-08 DIAGNOSIS — Z96641 Presence of right artificial hip joint: Secondary | ICD-10-CM | POA: Diagnosis not present

## 2024-05-08 DIAGNOSIS — M6281 Muscle weakness (generalized): Secondary | ICD-10-CM | POA: Diagnosis not present

## 2024-05-08 DIAGNOSIS — M25651 Stiffness of right hip, not elsewhere classified: Secondary | ICD-10-CM | POA: Diagnosis not present

## 2024-05-10 DIAGNOSIS — R350 Frequency of micturition: Secondary | ICD-10-CM | POA: Diagnosis not present

## 2024-05-10 DIAGNOSIS — Z8546 Personal history of malignant neoplasm of prostate: Secondary | ICD-10-CM | POA: Diagnosis not present

## 2024-05-13 ENCOUNTER — Telehealth: Payer: Self-pay | Admitting: Family Medicine

## 2024-05-13 DIAGNOSIS — M6281 Muscle weakness (generalized): Secondary | ICD-10-CM | POA: Diagnosis not present

## 2024-05-13 DIAGNOSIS — Z96641 Presence of right artificial hip joint: Secondary | ICD-10-CM | POA: Diagnosis not present

## 2024-05-13 DIAGNOSIS — M25651 Stiffness of right hip, not elsewhere classified: Secondary | ICD-10-CM | POA: Diagnosis not present

## 2024-05-13 NOTE — Telephone Encounter (Unsigned)
 Copied from CRM #[id number]. Topic: Clinical - Medication Question >> May 13, 2024 12:38 PM [place] J wrote: Reason for CRM: The patient is calling in to see if Dr. Micheal would be able to send in a script for ozempic . He recently had his ozempic  changed to rybelsus  but would like if ozempic  could be sent in this time around since insurance should be able to cover it.   He is completley out of medication and would like some sort of update on this as soon as possible. Please call patient back as he is aware of the next day turn around time.

## 2024-05-14 ENCOUNTER — Telehealth: Payer: Self-pay | Admitting: *Deleted

## 2024-05-14 DIAGNOSIS — J3089 Other allergic rhinitis: Secondary | ICD-10-CM | POA: Diagnosis not present

## 2024-05-14 DIAGNOSIS — J3081 Allergic rhinitis due to animal (cat) (dog) hair and dander: Secondary | ICD-10-CM | POA: Diagnosis not present

## 2024-05-14 DIAGNOSIS — J301 Allergic rhinitis due to pollen: Secondary | ICD-10-CM | POA: Diagnosis not present

## 2024-05-14 NOTE — Telephone Encounter (Signed)
 Patient informed of the message below and voiced understanding. Patient reported he will follow up with endocrinology in regards to prescription,

## 2024-05-14 NOTE — Telephone Encounter (Signed)
 Copied from CRM [id number]. Topic: General - Other >> May 14, 2024  3:41 PM Mercedes MATSU wrote: Reason for CRM: Patient is requesting a call back CMA Mykel after missing his call. Patient can be reached at: [phone]

## 2024-05-14 NOTE — Telephone Encounter (Signed)
 Left message for the patient to return my call.

## 2024-05-14 NOTE — Telephone Encounter (Signed)
 Please see previous encounter

## 2024-05-15 DIAGNOSIS — Z96641 Presence of right artificial hip joint: Secondary | ICD-10-CM | POA: Diagnosis not present

## 2024-05-15 DIAGNOSIS — M6281 Muscle weakness (generalized): Secondary | ICD-10-CM | POA: Diagnosis not present

## 2024-05-15 DIAGNOSIS — M25651 Stiffness of right hip, not elsewhere classified: Secondary | ICD-10-CM | POA: Diagnosis not present

## 2024-05-20 ENCOUNTER — Other Ambulatory Visit: Payer: Self-pay | Admitting: Family Medicine

## 2024-05-20 DIAGNOSIS — M25651 Stiffness of right hip, not elsewhere classified: Secondary | ICD-10-CM | POA: Diagnosis not present

## 2024-05-20 DIAGNOSIS — Z96641 Presence of right artificial hip joint: Secondary | ICD-10-CM | POA: Diagnosis not present

## 2024-05-20 DIAGNOSIS — M6281 Muscle weakness (generalized): Secondary | ICD-10-CM | POA: Diagnosis not present

## 2024-05-21 ENCOUNTER — Telehealth: Payer: Self-pay | Admitting: "Endocrinology

## 2024-05-21 ENCOUNTER — Other Ambulatory Visit: Payer: Self-pay

## 2024-05-21 DIAGNOSIS — J3089 Other allergic rhinitis: Secondary | ICD-10-CM | POA: Diagnosis not present

## 2024-05-21 DIAGNOSIS — E119 Type 2 diabetes mellitus without complications: Secondary | ICD-10-CM

## 2024-05-21 DIAGNOSIS — J301 Allergic rhinitis due to pollen: Secondary | ICD-10-CM | POA: Diagnosis not present

## 2024-05-21 MED ORDER — RYBELSUS 14 MG PO TABS: 14.0000 mg | ORAL_TABLET | Freq: Every day | ORAL | 1 refills | Status: AC

## 2024-05-21 NOTE — Telephone Encounter (Signed)
 NA

## 2024-05-22 DIAGNOSIS — Z96641 Presence of right artificial hip joint: Secondary | ICD-10-CM | POA: Diagnosis not present

## 2024-05-22 DIAGNOSIS — M25651 Stiffness of right hip, not elsewhere classified: Secondary | ICD-10-CM | POA: Diagnosis not present

## 2024-05-22 DIAGNOSIS — M6281 Muscle weakness (generalized): Secondary | ICD-10-CM | POA: Diagnosis not present

## 2024-05-27 DIAGNOSIS — M25651 Stiffness of right hip, not elsewhere classified: Secondary | ICD-10-CM | POA: Diagnosis not present

## 2024-05-27 DIAGNOSIS — Z96641 Presence of right artificial hip joint: Secondary | ICD-10-CM | POA: Diagnosis not present

## 2024-05-27 DIAGNOSIS — M6281 Muscle weakness (generalized): Secondary | ICD-10-CM | POA: Diagnosis not present

## 2024-05-29 ENCOUNTER — Ambulatory Visit (INDEPENDENT_AMBULATORY_CARE_PROVIDER_SITE_OTHER): Admitting: Family Medicine

## 2024-05-29 ENCOUNTER — Ambulatory Visit: Payer: Self-pay | Admitting: Family Medicine

## 2024-05-29 ENCOUNTER — Encounter: Payer: Self-pay | Admitting: Family Medicine

## 2024-05-29 VITALS — BP 124/68 | HR 63 | Temp 98.4°F | Wt 197.5 lb

## 2024-05-29 DIAGNOSIS — E039 Hypothyroidism, unspecified: Secondary | ICD-10-CM

## 2024-05-29 DIAGNOSIS — E785 Hyperlipidemia, unspecified: Secondary | ICD-10-CM

## 2024-05-29 DIAGNOSIS — Z96641 Presence of right artificial hip joint: Secondary | ICD-10-CM | POA: Diagnosis not present

## 2024-05-29 DIAGNOSIS — Z7984 Long term (current) use of oral hypoglycemic drugs: Secondary | ICD-10-CM | POA: Diagnosis not present

## 2024-05-29 DIAGNOSIS — E1165 Type 2 diabetes mellitus with hyperglycemia: Secondary | ICD-10-CM

## 2024-05-29 DIAGNOSIS — M6281 Muscle weakness (generalized): Secondary | ICD-10-CM | POA: Diagnosis not present

## 2024-05-29 DIAGNOSIS — M25651 Stiffness of right hip, not elsewhere classified: Secondary | ICD-10-CM | POA: Diagnosis not present

## 2024-05-29 LAB — POCT GLYCOSYLATED HEMOGLOBIN (HGB A1C): Hemoglobin A1C: 8.4 % — AB (ref 4.0–5.6)

## 2024-05-29 LAB — TSH: TSH: 3.57 u[IU]/mL (ref 0.35–5.50)

## 2024-05-29 NOTE — Patient Instructions (Signed)
 A1C 8.4 (goal < 7)    suspect this will come down to goal with Rybelsus  and exercise

## 2024-05-29 NOTE — Progress Notes (Signed)
 Established Patient Office Visit  Subjective   Patient ID: Aaron Bradshaw, male    DOB: 1950/11/21  Age: 73 y.o. MRN: [id number]  Chief Complaint  Patient presents with   Medical Management of Chronic Issues    HPI   Aaron Bradshaw is seen for chronic medical follow-up.  He has history of hypothyroidism, type 2 diabetes, osteoarthritis, dyslipidemia.  He had right total hip replacement September 8 and that went well.  He feels better than he has in years in terms of walking and activity.  Plans to start back more diligent exercise soon.  Already starting to swim more.  He was taken off Ozempic  prior to surgery and had recently him placed on Rybelsus  which he just darted a week ago.  He had requested referral to endocrinologist during the past year but is requesting repeat A1c today.  He had managed to get his A1c down to 6.9% on Ozempic  prior to his hip surgery.  On levothyroxine  75 mcg daily and overdue for TSH.  This was last checked 7/24. He takes rosuvastatin  for hyperlipidemia and lipids were checked in August and at goal.  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   Past Surgical History:  Procedure Laterality Date   APPENDECTOMY     CHOLECYSTECTOMY  2010   COLONOSCOPY     Tics, Polyp 12/26/2003; polyps 2010, Dr Avram   EYE SURGERY  retina   GOLD SEED IMPLANT N/A 12/08/2023   Procedure: INSERTION, GOLD SEEDS;  Surgeon: Alvaro Ricardo KATHEE Mickey., MD;  Location: [facility]  OR;  Service: Urology;  Laterality: N/A;  GOLD SEEDS WITH SPACE OAR   KNEE ARTHROSCOPY     Left   LAPAROSCOPIC APPENDECTOMY N/A 10/05/2015   Procedure: APPENDECTOMY LAPAROSCOPIC;  Surgeon: Lynwood Pina, MD;  Location: [facility] OR;  Service: General;  Laterality: N/A;   NOSE SURGERY     Dr  Jama   POLYPECTOMY     ROTATOR CUFF REPAIR Bilateral    left   SHOULDER ARTHROSCOPY     SPACE OAR INSTILLATION N/A 12/08/2023   Procedure: INJECTION, HYDROGEL SPACER;  Surgeon: Alvaro Ricardo KATHEE Mickey., MD;  Location: [facility] OR;  Service: Urology;  Laterality: N/A;    reports that he has never smoked. He has never used smokeless tobacco. He reports current alcohol use of about 1.0 - 2.0 standard drink of alcohol per week. He reports that he does not use drugs. family history includes Arthritis in his mother; Cancer in his father; Depression in his father, mother, and sister; Diabetes in his maternal grandfather and mother; Heart disease in his father; OCD in his father; Parkinsonism in his mother; Prostate cancer in his father and paternal grandfather; Stroke (age of onset: 38) in his mother. Allergies  Allergen Reactions  Codeine Nausea Only   Prozac  [Fluoxetine  Hcl] Anxiety    Increased anxiety    Review of Systems  Constitutional:  Negative for malaise/fatigue.  Eyes:  Negative for blurred vision.  Respiratory:  Negative for shortness of breath.   Cardiovascular:  Negative for chest pain.  Neurological:  Negative for dizziness, weakness and headaches.      Objective:     BP 124/68 (BP Location: Right Arm, Cuff Size: Normal)   Pulse 63   Temp 98.4 F (36.9 C) (Oral)   Wt 197 lb 8 oz (89.6 kg)   SpO2 96%   BMI 24.69 kg/m    Physical Exam Vitals reviewed.  Constitutional:      General: He is not in acute distress.    Appearance: He is well-developed. He is not ill-appearing.  Eyes:     Pupils: Pupils are equal, round, and reactive to light.  Neck:     Thyroid : No thyromegaly.  Cardiovascular:      Rate and Rhythm: Normal rate and regular rhythm.  Pulmonary:     Effort: Pulmonary effort is normal. No respiratory distress.     Breath sounds: Normal breath sounds. No wheezing or rales.  Musculoskeletal:     Cervical back: Neck supple.  Neurological:     Mental Status: He is alert and oriented to person, place, and time.      Results for orders placed or performed in visit on 05/29/24  POC HgB A1c  Result Value Ref Range   Hemoglobin A1C 8.4 (A) 4.0 - 5.6 %   HbA1c POC (<> result, manual entry)     HbA1c, POC (prediabetic range)     HbA1c, POC (controlled diabetic range)      Last CBC Lab Results  Component Value Date   WBC 7.5 03/27/2018   HGB 15.8 03/27/2018   HCT 44.7 03/27/2018   MCV 87.2 03/27/2018   MCH 30.1 09/25/2015   RDW 13.3 03/27/2018   PLT 193.0 03/27/2018   Last metabolic panel Lab Results  Component Value Date   GLUCOSE 162 (H) 02/06/2024   NA 138 02/06/2024   K 4.6 02/06/2024   CL 102 02/06/2024   CO2 29 02/06/2024   BUN 24 02/06/2024   CREATININE 0.77 02/06/2024   EGFR 95 02/06/2024   CALCIUM  9.0 02/06/2024   PROT 6.6 02/06/2024   ALBUMIN 4.3 02/15/2023   BILITOT 1.1 02/06/2024   ALKPHOS 67 02/15/2023   AST 15 02/06/2024   ALT 16 02/06/2024   ANIONGAP 12 12/08/2023   Last lipids Lab Results  Component Value Date   CHOL 104 02/06/2024   HDL 41 02/06/2024   LDLCALC 45 02/06/2024   LDLDIRECT 112.0 01/03/2017   TRIG 93 02/06/2024   CHOLHDL 2.5 02/06/2024   Last hemoglobin A1c Lab Results  Component Value Date   HGBA1C 8.4 (A) 05/29/2024   Last thyroid  functions Lab Results  Component Value Date   TSH 2.62 02/15/2023      The ASCVD Risk score (Arnett DK, et al., 2019) failed to calculate for the following reasons:   The valid total cholesterol range is 130 to 320 mg/dL    Assessment & Plan:   #1 uncontrolled type 2 diabetes with A1c 8.4%.  Patient just started Rybelsus  1 week ago and also not exercising much since his  hip replacement surgery September 8.  He would like to give this 3 more months of more diligent exercise and being on Rybelsus  before additional medications and this seems reasonable.  He is encouraged to set up diabetic eye exam which is overdue  #2 hypothyroidism.  On levothyroxine .  Check TSH today  #3 hyperlipidemia treated with rosuvastatin  10 mg daily.  Recent LDL cholesterol less than 70 and tolerating medication well.  Continue low saturated fat diet   Return in about 3 months (around 08/29/2024).    Wolm Scarlet, MD

## 2024-05-31 ENCOUNTER — Ambulatory Visit (AMBULATORY_SURGERY_CENTER)

## 2024-05-31 VITALS — Ht 75.0 in | Wt 200.2 lb

## 2024-05-31 DIAGNOSIS — Z860100 Personal history of colon polyps, unspecified: Secondary | ICD-10-CM

## 2024-05-31 MED ORDER — NA SULFATE-K SULFATE-MG SULF 17.5-3.13-1.6 GM/177ML PO SOLN: 1.0000 | Freq: Once | ORAL | 0 refills | Status: AC

## 2024-05-31 NOTE — Progress Notes (Signed)
 PCP MD at time of PV: Wolm Scarlet, MD  __________________________________________________________________________________________________________________________________________  No egg allergy known to patient  No soy allergy known to patient No issues known to pt with past sedation with any surgeries or procedures Patient denies ever being told they had issues or difficulty with intubation  No FH of Malignant Hyperthermia Pt is not on diet pills Pt is not on  home 02  Pt is not on blood thinners  No A fib or A flutter Have any cardiac testing pending--no  LOA: independent  No Chew or Snuff tobacco __________________________________________________________________________________________________________________________________________  Constipation: yes due to DM medications taking OTC metamucil as needed Prep: suprep  __________________________________________________________________________________________________________________________________________  PV completed with patient. Prep instructions reviewed and provided during apt. Rx sent to preferred pharmacy.  __________________________________________________________________________________________________________________________________________  Patient's chart reviewed by Norleen Schillings CNRA prior to previsit and patient appropriate for the [facility].  Previsit completed and red dot placed by patient's name on their procedure day (on provider's schedule).

## 2024-06-03 DIAGNOSIS — M6281 Muscle weakness (generalized): Secondary | ICD-10-CM | POA: Diagnosis not present

## 2024-06-03 DIAGNOSIS — M25651 Stiffness of right hip, not elsewhere classified: Secondary | ICD-10-CM | POA: Diagnosis not present

## 2024-06-03 DIAGNOSIS — Z96641 Presence of right artificial hip joint: Secondary | ICD-10-CM | POA: Diagnosis not present

## 2024-06-06 ENCOUNTER — Encounter: Payer: Self-pay | Admitting: Internal Medicine

## 2024-06-11 NOTE — Progress Notes (Unsigned)
 [facility] History and Physical   Primary Care Physician:  Micheal Wolm ORN, MD   Reason for Procedure:    Encounter Diagnosis  Name Primary?   Hx of colonic polyps Yes     Plan:    Colonoscopy   The patient was provided an opportunity to ask questions and all were answered. The patient agreed with the plan.   HPI: Aaron Bradshaw is a 73 y.o. male here for surveillance colonoscopy exam.  He did not have any polyps in 2020 but the colonoscopy prior to that he had a residual sessile serrated polyp at the appendix and required an appendectomy.  In 2016 there was a 10 mm SSP removed from the appendiceal area.  Prior to that he has had adenomas on other colonoscopies though diminutive.  Also has diverticulosis.  History of prostate cancer.   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 OR;  Service: General;  Laterality: N/A;   NOSE SURGERY     Dr   Jama   POLYPECTOMY     ROTATOR CUFF REPAIR Bilateral    left   SHOULDER ARTHROSCOPY     SPACE OAR INSTILLATION N/A 12/08/2023   Procedure: INJECTION, HYDROGEL SPACER;  Surgeon: Alvaro Ricardo KATHEE Mickey., MD;  Location: [facility] OR;  Service: Urology;  Laterality: N/A;     Current Outpatient Medications  Medication Sig Dispense Refill   Ascorbic Acid (VITAMIN C) 250 MG tablet Take 250 mg by mouth daily.     b complex vitamins capsule Take 1 capsule by mouth daily.     CHOLECALCIFEROL PO Take 1 tablet by mouth daily.     co-enzyme Q-10 30 MG capsule Take 30 mg by mouth daily. (Patient not taking: Reported on 05/31/2024)     Continuous Glucose Receiver (FREESTYLE LIBRE 3 READER) DEVI 1 each by Does not apply route daily at 2 PM. 2 each 1   Continuous Glucose Sensor (FREESTYLE LIBRE 3 SENSOR) MISC Use as directed to check blood sugar daily 2 each 1   EPIPEN  2-PAK 0.3 MG/0.3ML SOAJ injection USE AS DIRECTED AS NEEDED  1   glucose blood (ONETOUCH VERIO) test strip Use as instructed once  a day 100 each 3   JARDIANCE  25 MG TABS tablet TAKE 1 TABLET BY MOUTH EVERY DAY BEFORE BREAKFAST 30 tablet 5   KRILL OIL PO Take 1 capsule by mouth daily. (Patient not taking: Reported on 05/31/2024)     levothyroxine  (SYNTHROID ) 75 MCG tablet TAKE 1 TABLET BY MOUTH EVERY DAY 30 tablet 0   MAGNESIUM PO Take 1 tablet by mouth daily.     metFORMIN  (GLUCOPHAGE -XR) 750 MG 24 hr tablet TAKE 1 TABLET BY MOUTH TWICE A DAY 180 tablet 1   Omega-3 Fatty Acids (FISH OIL) 1000 MG CAPS Take by mouth.     OneTouch Delica Lancets 33G MISC Use as directed once a day 100 each 3   PRESCRIPTION MEDICATION Inject 1 each into the skin every 7 (seven) days. Patient gets an allergy shot once a week at [facility] off of [address]     Probiotic Product (PROBIOTIC DAILY PO) Take by mouth daily.     rosuvastatin  (CRESTOR ) 10 MG tablet TAKE 1 TABLET BY MOUTH EVERY DAY 90 tablet 1   Semaglutide  (RYBELSUS ) 14 MG TABS Take 1 tablet (14 mg total) by mouth  daily. 90 tablet 1   tamsulosin  (FLOMAX ) 0.4 MG CAPS capsule Take 1 capsule (0.4 mg total) by mouth daily after supper. (Patient not taking: Reported on 05/31/2024) 30 capsule 5   No current facility-administered medications for this visit.    Allergies as of 06/12/2024 - Review Complete 05/31/2024  Allergen Reaction Noted   Codeine Nausea Only 06/24/2009   Prozac  [fluoxetine  hcl] Anxiety 02/03/2011    Family History  Problem Relation Age of Onset   Depression Father    Heart disease Father        MI in 91s   Prostate cancer Father    OCD Father    Cancer Father    Depression Mother    Arthritis Mother    Stroke Mother 48   Parkinsonism Mother    Diabetes Mother    Depression Sister        OCD   Prostate cancer Paternal Grandfather    Diabetes Maternal Grandfather        MI in 67s   Colon cancer Neg Hx    Colon polyps Neg Hx    Esophageal cancer Neg Hx    Rectal cancer Neg Hx    Stomach cancer Neg Hx     Social History   Socioeconomic History   Marital status: Married    Spouse name: Not on file   Number of children: Not on file   Years of education: Not on file   Highest education level: Bachelor's degree (e.g., BA, AB, BS)  Occupational History   Not on file  Tobacco Use   Smoking status: Never   Smokeless tobacco: Never  Vaping Use   Vaping status: Never Used  Substance and Sexual Activity   Alcohol use: Yes    Alcohol/week: 1.0 - 2.0 standard drink of alcohol    Types: 1 - 2 Standard drinks or equivalent per week    Comment: beer occ stopped wine   Drug use: No   Sexual activity: Not Currently  Other Topics Concern   Not on file  Social History Narrative   Not on file   Social Drivers of Health   Financial Resource Strain: Low Risk  (08/29/2023)   Overall Financial Resource Strain (CARDIA)    Difficulty of Paying Living Expenses: Not very hard  Food Insecurity: No Food Insecurity (09/19/2023)  Hunger Vital Sign    Worried About Running Out of  Food in the Last Year: Never true    Ran Out of Food in the Last Year: Never true  Transportation Needs: No Transportation Needs (09/19/2023)   PRAPARE - Administrator, Civil Service (Medical): No    Lack of Transportation (Non-Medical): No  Physical Activity: Sufficiently Active (08/29/2023)   Exercise Vital Sign    Days of Exercise per Week: 4 days    Minutes of Exercise per Session: 90 min  Stress: No Stress Concern Present (08/29/2023)   [employer] of Occupational Health - Occupational Stress Questionnaire    Feeling of Stress : Not at all  Social Connections: Socially Integrated (08/29/2023)   Social Connection and Isolation Panel    Frequency of Communication with Friends and Family: Three times a week    Frequency of Social Gatherings with Friends and Family: Twice a week    Attends Religious Services: More than 4 times per year    Active Member of [employer] or Organizations: Yes    Attends Engineer, Structural: More than 4 times per year    Marital Status: Married  Catering Manager Violence: Not At Risk (09/19/2023)   Humiliation, Afraid, Rape, and Kick questionnaire    Fear of Current or Ex-Partner: No    Emotionally Abused: No    Physically Abused: No    Sexually Abused: No    Review of Systems: Positive for *** All other review of systems negative except as mentioned in the HPI.  Physical Exam: Vital signs There were no vitals taken for this visit.  General:   Alert,  Well-developed, well-nourished, pleasant and cooperative in NAD Lungs:  Clear throughout to auscultation.   Heart:  Regular rate and rhythm; no murmurs, clicks, rubs,  or gallops. Abdomen:  Soft, nontender and nondistended. Normal bowel sounds.   Neuro/Psych:  Alert and cooperative. Normal mood and affect. A and O x 3   @Ramzey Petrovic  CHARLENA Commander, MD, [facility] [phone] (pager) 06/11/2024 8:34 PM@

## 2024-06-12 ENCOUNTER — Encounter: Payer: Self-pay | Admitting: Internal Medicine

## 2024-06-12 ENCOUNTER — Ambulatory Visit: Admitting: Internal Medicine

## 2024-06-12 VITALS — BP 140/80 | HR 55 | Temp 97.7°F | Resp 11 | Ht 75.0 in | Wt 197.0 lb

## 2024-06-12 DIAGNOSIS — Z1211 Encounter for screening for malignant neoplasm of colon: Secondary | ICD-10-CM | POA: Diagnosis not present

## 2024-06-12 DIAGNOSIS — E119 Type 2 diabetes mellitus without complications: Secondary | ICD-10-CM | POA: Diagnosis not present

## 2024-06-12 DIAGNOSIS — E785 Hyperlipidemia, unspecified: Secondary | ICD-10-CM | POA: Diagnosis not present

## 2024-06-12 DIAGNOSIS — K648 Other hemorrhoids: Secondary | ICD-10-CM | POA: Diagnosis not present

## 2024-06-12 DIAGNOSIS — K5730 Diverticulosis of large intestine without perforation or abscess without bleeding: Secondary | ICD-10-CM

## 2024-06-12 DIAGNOSIS — Z860101 Personal history of adenomatous and serrated colon polyps: Secondary | ICD-10-CM

## 2024-06-12 DIAGNOSIS — Z860100 Personal history of colon polyps, unspecified: Secondary | ICD-10-CM

## 2024-06-12 MED ORDER — SODIUM CHLORIDE 0.9 % IV SOLN: 500.0000 mL | INTRAVENOUS | Status: DC

## 2024-06-12 NOTE — Patient Instructions (Addendum)
 I did not see any polyps and did not see cancer today.  You do have diverticulosis which are pockets in the colon which you have had before.    Internal hemorrhoids were swollen also, commonly occurs related to the diarrhea from a colonoscopy prep.  You can consider repeating a colonoscopy in 5 years if you are fit at that time.  I appreciate the opportunity to care for you. Lupita CHARLENA Commander, MD, FACG  YOU HAD AN ENDOSCOPIC PROCEDURE TODAY AT THE [facility]:   Refer to the procedure report that was given to you for any specific questions about what was found during the examination.  If the procedure report does not answer your questions, please call your gastroenterologist to clarify.  If you requested that your care partner not be given the details of your procedure findings, then the procedure report has been included in a sealed envelope for you to review at your convenience later.  YOU SHOULD EXPECT: Some feelings of bloating in the abdomen. Passage of more gas than usual.  Walking can help get rid of the air that was put into your GI tract during the procedure and reduce the bloating. If you had a lower endoscopy (such as a colonoscopy or flexible sigmoidoscopy) you may notice spotting of blood in your stool or on the toilet paper. If you underwent a bowel prep for your procedure, you may not have a normal bowel movement for a few days.  Please Note:  You might notice some irritation and congestion in your nose or some drainage.  This is from the oxygen used during your procedure.  There is no need for concern and it should clear up in a day or so.  SYMPTOMS TO REPORT IMMEDIATELY:  Following lower endoscopy (colonoscopy or flexible sigmoidoscopy):  Excessive amounts of blood in the stool  Significant tenderness or worsening of abdominal pains  Swelling of the abdomen that is new, acute  Fever of 100F or higher   For urgent or emergent issues, a gastroenterologist can be  reached at any hour by calling [phone]. Do not use MyChart messaging for urgent concerns.    DIET:  We do recommend a small meal at first, but then you may proceed to your regular diet.  Drink plenty of fluids but you should avoid alcoholic beverages for 24 hours.  ACTIVITY:  You should plan to take it easy for the rest of today and you should NOT DRIVE or use heavy machinery until tomorrow (because of the sedation medicines used during the test).    FOLLOW UP: Our staff will call the number listed on your records the next business day following your procedure.  We will call around 7:15- 8:00 am to check on you and address any questions or concerns that you may have regarding the information given to you following your procedure. If we do not reach you, we will leave a message.     If any biopsies were taken you will be contacted by phone or by letter within the next 1-3 weeks.  Please call us  at [phone] if you have not heard about the biopsies in 3 weeks.    SIGNATURES/CONFIDENTIALITY: You and/or your care partner have signed paperwork which will be entered into your electronic medical record.  These signatures attest to the fact that that the information above on your After Visit Summary has been reviewed and is understood.  Full responsibility of the confidentiality of this discharge information lies with  you and/or your care-partner.

## 2024-06-12 NOTE — Progress Notes (Signed)
 Pt's states no medical or surgical changes since previsit or office visit.

## 2024-06-12 NOTE — Op Note (Signed)
 [facility] Patient Name: Aaron Bradshaw Procedure Date: 06/12/2024 7:59 AM MRN: [id number] Endoscopist: Lupita FORBES Commander , MD, [id number] Age: 73 Referring MD:  Date of Birth: Aug 19, 1950 Gender: Male Account #: [id number] Procedure:                Colonoscopy Indications:              High risk colon cancer surveillance: Personal                            history of colonic polyps, Last colonoscopy: 2020 Medicines:                Monitored Anesthesia Care Procedure:                Pre-Anesthesia Assessment:                           - Prior to the procedure, a History and Physical                            was performed, and patient medications and                            allergies were reviewed. The patient's tolerance of                            previous anesthesia was also reviewed. The risks                            and benefits of the procedure and the sedation                            options and risks were discussed with the patient.                            All questions were answered, and informed consent                            was obtained. Prior Anticoagulants: The patient has                            taken no anticoagulant or antiplatelet agents. ASA                            Grade Assessment: II - A patient with mild systemic                            disease. After reviewing the risks and benefits,                            the patient was deemed in satisfactory condition to                            undergo the procedure.  After obtaining informed consent, the colonoscope                            was passed under direct vision. Throughout the                            procedure, the patient's blood pressure, pulse, and                            oxygen saturations were monitored continuously. The                            Olympus CF-HQ190L ([id number]) Colonoscope was                            introduced  through the anus and advanced to the the                            cecum, identified by appendiceal orifice and                            ileocecal valve. The colonoscopy was performed                            without difficulty. The patient tolerated the                            procedure well. The quality of the bowel                            preparation was good. The ileocecal valve,                            appendiceal orifice, and rectum were photographed.                            The bowel preparation used was SUPREP via split                            dose instruction. Scope In: 8:16:15 AM Scope Out: 8:30:50 AM Scope Withdrawal Time: 0 hours 11 minutes 31 seconds  Total Procedure Duration: 0 hours 14 minutes 35 seconds  Findings:                 The perianal and digital rectal examinations were                            normal. Pertinent negatives include normal prostate                            (size, shape, and consistency).                           Multiple diverticula were found in the sigmoid  colon.                           Internal hemorrhoids were found.                           The exam was otherwise without abnormality on                            direct and retroflexion views. Complications:            No immediate complications. Estimated Blood Loss:     Estimated blood loss: none. Impression:               - Diverticulosis in the sigmoid colon.                           - Internal hemorrhoids.                           - The examination was otherwise normal on direct                            and retroflexion views.                           - No specimens collected.                           - Personal history of colonic polyps. 2005 dimin                            rectal adenoma                           2010 2 dimin rectal adenomas                           01/23/2015 10 mm ssp at appendiceal area - 6 month                             f/u to ensure complete removal - 2017 -                            appendectomy necessary                           01/22/2019 no polyps Recommendation:           - Patient has a contact number available for                            emergencies. The signs and symptoms of potential                            delayed complications were discussed with the  patient. Return to normal activities tomorrow.                            Written discharge instructions were provided to the                            patient.                           - Resume previous diet.                           - Continue present medications.                           - Office visit recall in 5 years to consider repeat                            colonoscopy - he will be 78 so not definite. Lupita FORBES Commander, MD 06/12/2024 8:40:09 AM This report has been signed electronically.

## 2024-06-12 NOTE — Progress Notes (Signed)
 Report given to PACU, vss

## 2024-06-13 ENCOUNTER — Telehealth: Payer: Self-pay | Admitting: *Deleted

## 2024-06-13 ENCOUNTER — Other Ambulatory Visit: Payer: Self-pay | Admitting: Family Medicine

## 2024-06-13 NOTE — Telephone Encounter (Signed)
No answer for post procedure follow up. Left VM. °

## 2024-06-18 DIAGNOSIS — J301 Allergic rhinitis due to pollen: Secondary | ICD-10-CM | POA: Diagnosis not present

## 2024-06-18 DIAGNOSIS — J3089 Other allergic rhinitis: Secondary | ICD-10-CM | POA: Diagnosis not present

## 2024-06-18 DIAGNOSIS — J3081 Allergic rhinitis due to animal (cat) (dog) hair and dander: Secondary | ICD-10-CM | POA: Diagnosis not present

## 2024-06-25 DIAGNOSIS — J3081 Allergic rhinitis due to animal (cat) (dog) hair and dander: Secondary | ICD-10-CM | POA: Diagnosis not present

## 2024-06-25 DIAGNOSIS — J301 Allergic rhinitis due to pollen: Secondary | ICD-10-CM | POA: Diagnosis not present

## 2024-06-25 DIAGNOSIS — J3089 Other allergic rhinitis: Secondary | ICD-10-CM | POA: Diagnosis not present

## 2024-07-09 ENCOUNTER — Other Ambulatory Visit: Payer: Self-pay | Admitting: Family Medicine

## 2024-07-23 ENCOUNTER — Other Ambulatory Visit: Payer: Self-pay | Admitting: Family Medicine

## 2024-07-24 ENCOUNTER — Ambulatory Visit (INDEPENDENT_AMBULATORY_CARE_PROVIDER_SITE_OTHER): Payer: PPO

## 2024-07-24 VITALS — BP 122/62 | HR 76 | Temp 97.5°F | Ht 75.0 in | Wt 204.3 lb

## 2024-07-24 DIAGNOSIS — Z0000 Encounter for general adult medical examination without abnormal findings: Secondary | ICD-10-CM

## 2024-07-24 NOTE — Progress Notes (Signed)
 No voiced or noted concerns at this time  Chief Complaint  Patient presents with   Medicare Wellness     Subjective:   Aaron Bradshaw is a 73 y.o. male who presents for a Medicare Annual Wellness Visit.  Visit info / Clinical Intake: Medicare Wellness Visit Type:: Subsequent Annual Wellness Visit Medicare Wellness Visit Mode:: In-person (required for WTM) Interpreter Needed?: No Pre-visit prep was completed: yes AWV questionnaire completed by patient prior to visit?: no Living arrangements:: lives with spouse/significant other Patient's Overall Health Status Rating: very good Typical amount of pain: none Does pain affect daily life?: no Are you currently prescribed opioids?: no  Dietary Habits and Nutritional Risks How many meals a day?: 3 Eats fruit and vegetables daily?: yes Most meals are obtained by: preparing own meals In the last 2 weeks, have you had any of the following?: none Diabetic:: (!) yes Any non-healing wounds?: no How often do you check your BS?: as needed Would you like to be referred to a Nutritionist or for Diabetic Management? : no  Functional Status Activities of Daily Living (to include ambulation/medication): Independent Ambulation: Independent with device- listed below Home Assistive Devices/Equipment: Eyeglasses Medication Administration: Independent Home Management (perform basic housework or laundry): Independent Manage your own finances?: yes Primary transportation is: driving Concerns about vision?: no *vision screening is required for WTM* Concerns about hearing?: no  Fall Screening Falls in the past year?: 0 Number of falls in past year: 0 Was there an injury with Fall?: 0 Fall Risk Category Calculator: 0 Patient Fall Risk Level: Low Fall Risk  Fall Risk Patient at Risk for Falls Due to: No Fall Risks Fall risk Follow up: Falls evaluation completed  Home and Transportation Safety: All rugs have non-skid backing?: yes All  stairs or steps have railings?: yes Grab bars in the bathtub or shower?: yes Have non-skid surface in bathtub or shower?: yes Good home lighting?: yes Regular seat belt use?: yes Hospital stays in the last year:: no  Cognitive Assessment Difficulty concentrating, remembering, or making decisions? : no Will 6CIT or Mini Cog be Completed: yes What year is it?: 0 points What month is it?: 0 points Give patient an address phrase to remember (5 components): [address] [place]  About what time is it?: 0 points Count backwards from 20 to 1: 0 points Say the months of the year in reverse: 0 points Repeat the address phrase from earlier: 0 points 6 CIT Score: 0 points  Advance Directives (For Healthcare) Does Patient Have a Medical Advance Directive?: Yes Does patient want to make changes to medical advance directive?: No - Patient declined Type of Advance Directive: Healthcare Power of [place]; Living will Copy of Healthcare Power of Attorney in Chart?: No - copy requested Copy of Living Will in Chart?: No - copy requested  Reviewed/Updated  Reviewed/Updated: Reviewed All (Medical, Surgical, Family, Medications, Allergies, Care Teams, Patient Goals)    Allergies (verified) Codeine and Prozac  [fluoxetine  hcl]   Current Medications (verified) Outpatient Encounter Medications as of 07/24/2024  Medication Sig   Ascorbic Acid (VITAMIN C) 250 MG tablet Take 250 mg by mouth daily.   b complex vitamins capsule Take 1 capsule by mouth daily.   CHOLECALCIFEROL PO Take 1 tablet by mouth daily.   co-enzyme Q-10 30 MG capsule Take 30 mg by mouth daily. (Patient not taking: No sig reported)   Continuous Glucose Receiver (FREESTYLE LIBRE 3 READER) DEVI 1 each by Does not apply route daily at 2 PM.  Continuous Glucose Sensor (FREESTYLE LIBRE 3 SENSOR) MISC Use as directed to check blood sugar daily   EPIPEN  2-PAK 0.3 MG/0.3ML SOAJ injection USE AS DIRECTED AS NEEDED   glucose blood  (ONETOUCH VERIO) test strip Use as instructed once a day   JARDIANCE  25 MG TABS tablet TAKE 1 TABLET BY MOUTH EVERY DAY BEFORE BREAKFAST   KRILL OIL PO Take 1 capsule by mouth daily. (Patient not taking: No sig reported)   levothyroxine  (SYNTHROID ) 75 MCG tablet TAKE 1 TABLET BY MOUTH EVERY DAY   MAGNESIUM PO Take 1 tablet by mouth daily.   metFORMIN  (GLUCOPHAGE -XR) 750 MG 24 hr tablet TAKE 1 TABLET BY MOUTH TWICE A DAY   Omega-3 Fatty Acids (FISH OIL) 1000 MG CAPS Take by mouth. (Patient not taking: Reported on 06/12/2024)   OneTouch Delica Lancets 33G MISC Use as directed once a day   PRESCRIPTION MEDICATION Inject 1 each into the skin every 7 (seven) days. Patient gets an allergy shot once a week at [facility] off of [address] (Patient not taking: Reported on 06/12/2024)   Probiotic Product (PROBIOTIC DAILY PO) Take by mouth daily.   rosuvastatin  (CRESTOR ) 10 MG tablet TAKE 1 TABLET BY MOUTH EVERY DAY   Semaglutide  (RYBELSUS ) 14 MG TABS Take 1 tablet (14 mg total) by mouth daily.   tamsulosin  (FLOMAX ) 0.4 MG CAPS capsule Take 1 capsule (0.4 mg total) by mouth daily after supper. (Patient not taking: No sig reported)   No facility-administered encounter medications on file as of 07/24/2024.    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 OR;  Service: General;  Laterality: N/A;   NOSE SURGERY     Dr  Jama   POLYPECTOMY     ROTATOR CUFF REPAIR Bilateral    left   SHOULDER ARTHROSCOPY     SPACE OAR INSTILLATION N/A 12/08/2023   Procedure: INJECTION, HYDROGEL SPACER;  Surgeon: Alvaro Ricardo KATHEE Mickey., MD;  Location: [facility] OR;  Service: Urology;  Laterality: N/A;   Family History  Problem Relation Age of Onset   Depression Father    Heart disease Father        MI in 66s   Prostate  cancer Father    OCD Father    Cancer Father    Depression Mother    Arthritis Mother    Stroke Mother 18   Parkinsonism Mother    Diabetes Mother    Depression Sister        OCD   Prostate cancer Paternal Grandfather    Diabetes Maternal Grandfather        MI in 82s   Colon cancer Neg Hx    Colon polyps Neg Hx    Esophageal cancer Neg Hx    Rectal cancer Neg Hx    Stomach cancer Neg Hx    Social History   Occupational History   Not on file  Tobacco Use   Smoking status: Never   Smokeless tobacco: Never  Vaping Use   Vaping status: Never Used  Substance and Sexual Activity   Alcohol use: Yes    Alcohol/week: 1.0 - 2.0 standard drink of alcohol    Types: 1 - 2 Standard drinks or equivalent per week    Comment: beer occ stopped wine   Drug use: No   Sexual activity: Not Currently   Tobacco Counseling Counseling given: No  SDOH Screenings   Food Insecurity: No Food Insecurity (07/24/2024)  Housing: Unknown (07/24/2024)  Transportation Needs: No Transportation Needs (07/24/2024)  Utilities: Not At Risk (07/24/2024)  Alcohol Screen: Low Risk (09/19/2023)   Depression (PHQ2-9): Low Risk (07/24/2024)  Financial Resource Strain: Low Risk (08/29/2023)  Physical Activity: Sufficiently Active (07/24/2024)  Social Connections: Socially Integrated (07/24/2024)  Stress: No Stress Concern Present (07/24/2024)  Tobacco Use: Low Risk (07/24/2024)  Health Literacy: Adequate Health Literacy (07/24/2024)   See flowsheets for full screening details  Depression Screen PHQ 2 & 9 Depression Scale- Over the past 2 weeks, how often have you been bothered by any of the following problems? Little interest or pleasure in doing things: 0 Feeling down, depressed, or hopeless (PHQ Adolescent also includes...irritable): 0 PHQ-2 Total Score: 0     Goals Addressed               This Visit's Progress     Remin active (pt-stated)               Objective:    Today's Vitals   07/24/24 1039  BP: 122/62  Pulse: 76  Temp: (!) 97.5 F (36.4 C)  TempSrc: Oral  SpO2: 91%  Weight: 204 lb 4.8 oz (92.7 kg)  Height: 6' 3 (1.905 m)   Body mass index is 25.54 kg/m.  Hearing/Vision screen Hearing Screening - Comments:: Denies hearing difficulties   Vision Screening - Comments:: Wears rx glasses - up to date with routine eye exams with  [facility] Immunizations and Health Maintenance Health Maintenance  Topic Date Due   COVID-19 Vaccine (5 - 2025-26 season) 03/25/2024   OPHTHALMOLOGY EXAM  04/19/2024   HEMOGLOBIN A1C  11/26/2024   FOOT EXAM  01/24/2025   Diabetic kidney evaluation - eGFR measurement  02/05/2025   Diabetic kidney evaluation - Urine ACR  02/05/2025   Medicare Annual Wellness (AWV)  07/24/2025   DTaP/Tdap/Td (2 - Td or Tdap) 01/04/2027   Colonoscopy  06/12/2029   Pneumococcal Vaccine: 50+ Years  Completed   Influenza Vaccine  Completed   Hepatitis C Screening  Completed   Zoster Vaccines- Shingrix  Completed   Meningococcal B Vaccine  Aged Out        Assessment/Plan:  This is a routine wellness examination for  [place].  Patient Care  Team: Micheal Wolm ORN, MD as PCP - General (Family Medicine) Vertell Pont, RN as Oncology Nurse Navigator  I have personally reviewed and noted the following in the patients chart:   Medical and social history Use of alcohol, tobacco or illicit drugs  Current medications and supplements including opioid prescriptions. Functional ability and status Nutritional status Physical activity Advanced directives List of other physicians Hospitalizations, surgeries, and ER visits in previous 12 months Vitals Screenings to include cognitive, depression, and falls Referrals and appointments  No orders of the defined types were placed in this encounter.  In addition, I have reviewed and discussed with patient certain preventive protocols, quality metrics, and best practice recommendations. A written personalized care plan for preventive services as well as general preventive health recommendations were provided to patient.   Rojelio ORN Blush, LPN   [id number]   Return in 53 weeks (on 07/30/2025).  After Visit Summary: (In Person-Printed) AVS printed and given to the patient  Nurse Notes: No voiced or noted concerns at this time

## 2024-07-24 NOTE — Patient Instructions (Addendum)
 Mr. Band,  Thank you for taking the time for your Medicare Wellness Visit. I appreciate your continued commitment to your health goals. Please review the care plan we discussed, and feel free to reach out if I can assist you further.  Please note that Annual Wellness Visits do not include a physical exam. Some assessments may be limited, especially if the visit was conducted virtually. If needed, we may recommend an in-person follow-up with your provider.  Ongoing Care Seeing your primary care provider every 3 to 6 months helps us  monitor your health and provide consistent, personalized care.    Referrals If a referral was made during today's visit and you haven't received any updates within two weeks, please contact the referred provider directly to check on the status.  Recommended Screenings:  Health Maintenance  Topic Date Due   COVID-19 Vaccine (5 - 2025-26 season) 03/25/2024   Eye exam for diabetics  04/19/2024   Hemoglobin A1C  11/26/2024   Complete foot exam   01/24/2025   Yearly kidney function blood test for diabetes  02/05/2025   Yearly kidney health urinalysis for diabetes  02/05/2025   Medicare Annual Wellness Visit  07/24/2025   DTaP/Tdap/Td vaccine (2 - Td or Tdap) 01/04/2027   Colon Cancer Screening  06/12/2029   Pneumococcal Vaccine for age over 75  Completed   Flu Shot  Completed   Hepatitis C Screening  Completed   Zoster (Shingles) Vaccine  Completed   Meningitis B Vaccine  Aged Out       07/24/2024   10:50 AM  Advanced Directives  Does Patient Have a Medical Advance Directive? Yes  Type of Estate Agent of [place];Living will  Does patient want to make changes to medical advance directive? No - Patient declined  Copy of Healthcare Power of Attorney in Chart? No - copy requested    Vision: Annual vision screenings are recommended for early detection of glaucoma, cataracts, and diabetic retinopathy. These exams can also reveal  signs of chronic conditions such as diabetes and high blood pressure.  Dental: Annual dental screenings help detect early signs of oral cancer, gum disease, and other conditions linked to overall health, including heart disease and diabetes.  Please see the attached documents for additional preventive care recommendations.

## 2024-08-01 ENCOUNTER — Ambulatory Visit: Admitting: "Endocrinology

## 2024-08-14 LAB — OPHTHALMOLOGY REPORT-SCANNED

## 2024-08-28 ENCOUNTER — Ambulatory Visit: Admitting: Family Medicine

## 2024-08-28 ENCOUNTER — Encounter: Payer: Self-pay | Admitting: Family Medicine

## 2024-08-28 VITALS — BP 118/70 | HR 60 | Temp 98.1°F | Wt 202.2 lb

## 2024-08-28 DIAGNOSIS — E1165 Type 2 diabetes mellitus with hyperglycemia: Secondary | ICD-10-CM

## 2024-08-28 LAB — POCT GLYCOSYLATED HEMOGLOBIN (HGB A1C): Hemoglobin A1C: 8.9 % — AB (ref 4.0–5.6)

## 2024-08-28 NOTE — Progress Notes (Signed)
 "  Established Patient Office Visit  Subjective   Patient ID: Aaron Bradshaw, male    DOB: Jul 05, 1951  Age: 74 y.o. MRN: [id number]  Chief Complaint  Patient presents with   Medical Management of Chronic Issues    HPI    Patient is seen today for medical follow-up.  He has history of hypothyroidism, type 2 diabetes, remote history of transverse myelitis, osteoarthritis with prior right hip replacement, prostate cancer, dyslipidemia.  Generally doing well.  He is going back to working part-time with [employer].  Does fairly physical work.  Also diligent with exercise with regular swimming and some resistance training.  Generally feels well.  Last summer his A1c's were better controlled on Ozempic  but he had significant weight loss of 285 pounds.  He feels like a healthier weight for him is around 200 pounds.  He actually has seen endocrinologist but missed last appointment.  Was switched over to Rybelsus .  His last A1c was 8.4%.  Currently on 14 mg Rybelsus .  Also takes metformin  and Jardiance .  He states his dietary compliance has not been great recently.  Recently went back to eating more breads and drinking orange juice.  He is reluctant to consider injectable GLP-1's because of concerns of excessive weight loss.  Saw urologist recently and had follow-up PSA of 1.0.  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   Past Surgical History:  Procedure Laterality Date    APPENDECTOMY     CHOLECYSTECTOMY  2010   COLONOSCOPY     Tics, Polyp 12/26/2003; polyps 2010, Dr Avram   EYE SURGERY  retina   GOLD SEED IMPLANT N/A 12/08/2023   Procedure: INSERTION, GOLD SEEDS;  Surgeon: Alvaro Ricardo KATHEE Mickey., MD;  Location: [facility] OR;  Service: Urology;  Laterality: N/A;  GOLD SEEDS WITH SPACE OAR   KNEE ARTHROSCOPY     Left   LAPAROSCOPIC APPENDECTOMY N/A 10/05/2015   Procedure: APPENDECTOMY LAPAROSCOPIC;  Surgeon: Lynwood Pina, MD;  Location: [facility] OR;  Service: General;  Laterality: N/A;   NOSE SURGERY     Dr  Jama   POLYPECTOMY     ROTATOR CUFF REPAIR Bilateral    left   SHOULDER ARTHROSCOPY     SPACE OAR INSTILLATION N/A 12/08/2023   Procedure: INJECTION, HYDROGEL SPACER;  Surgeon: Alvaro Ricardo KATHEE Mickey., MD;  [facility] OR;  Service: Urology;  Laterality: N/A;    reports that he has never smoked. He has never used smokeless tobacco. He reports current alcohol use of about 1.0 - 2.0 standard drink of alcohol per week. He reports that he does not use drugs. family history includes Arthritis in his mother; Cancer in his father; Depression in his father, mother, and sister; Diabetes in his maternal grandfather and mother; Heart disease  in his father; OCD in his father; Parkinsonism in his mother; Prostate cancer in his father and paternal grandfather; Stroke (age of onset: 81) in his mother. Allergies[1]  Review of Systems  Constitutional:  Negative for malaise/fatigue.  Eyes:  Negative for blurred vision.  Respiratory:  Negative for shortness of breath.   Cardiovascular:  Negative for chest pain.  Neurological:  Negative for dizziness, weakness and headaches.      Objective:     BP 118/70   Pulse 60   Temp 98.1 F (36.7 C) (Oral)   Wt 202 lb 3.2 oz (91.7 kg)   SpO2 95%   BMI 25.27 kg/m  BP Readings from Last 3 Encounters:  08/28/24 118/70  07/24/24 122/62  06/12/24 (!) 140/80   Wt Readings from Last 3 Encounters:  08/28/24 202 lb 3.2 oz (91.7 kg)   07/24/24 204 lb 4.8 oz (92.7 kg)  06/12/24 197 lb (89.4 kg)      Physical Exam Vitals reviewed.  Constitutional:      Appearance: Normal appearance.  Cardiovascular:     Rate and Rhythm: Normal rate and regular rhythm.  Pulmonary:     Effort: Pulmonary effort is normal.     Breath sounds: Normal breath sounds.  Neurological:     Mental Status: He is alert.      Results for orders placed or performed in visit on 08/28/24  POC HgB A1c  Result Value Ref Range   Hemoglobin A1C 8.9 (A) 4.0 - 5.6 %   HbA1c POC (<> result, manual entry)     HbA1c, POC (prediabetic range)     HbA1c, POC (controlled diabetic range)      Last CBC Lab Results  Component Value Date   WBC 7.5 03/27/2018   HGB 15.8 03/27/2018   HCT 44.7 03/27/2018   MCV 87.2 03/27/2018   MCH 30.1 09/25/2015   RDW 13.3 03/27/2018   PLT 193.0 03/27/2018   Last metabolic panel Lab Results  Component Value Date   GLUCOSE 162 (H) 02/06/2024   NA 138 02/06/2024   K 4.6 02/06/2024   CL 102 02/06/2024   CO2 29 02/06/2024   BUN 24 02/06/2024   CREATININE 0.77 02/06/2024   EGFR 95 02/06/2024   CALCIUM  9.0 02/06/2024   PROT 6.6 02/06/2024   ALBUMIN 4.3 02/15/2023   BILITOT 1.1 02/06/2024   ALKPHOS 67 02/15/2023   AST 15 02/06/2024   ALT 16 02/06/2024   ANIONGAP 12 12/08/2023   Last lipids Lab Results  Component Value Date   CHOL 104 02/06/2024   HDL 41 02/06/2024   LDLCALC 45 02/06/2024   LDLDIRECT 112.0 01/03/2017   TRIG 93 02/06/2024   CHOLHDL 2.5 02/06/2024   Last hemoglobin A1c Lab Results  Component Value Date   HGBA1C 8.9 (A) 08/28/2024   Last thyroid  functions Lab Results  Component Value Date   TSH 3.57 05/29/2024      The ASCVD Risk score (Arnett DK, et al., 2019) failed to calculate for the following reasons:   The valid total cholesterol range is 130 to 320 mg/dL    Assessment & Plan:   Problem List Items Addressed This Visit       Unprioritized   Uncontrolled type 2  diabetes mellitus with hyperglycemia (HCC) - Primary   Relevant Orders   POC HgB A1c (Completed)  Poorly controlled diabetes with A1c today 8.9%.  Patient relates poor compliance recently with diet.  Currently on regimen of Rybelsus  14 mg daily, metformin  extended  release 750 mg twice daily, and Jardiance  25 mg daily.  He is somewhat equivocal today whether he plans to follow-up with endocrinology.  If he decides to do so needs to set up follow-up with them soon.  He is very reluctant to add additional medications at this time.  He prefers 102-month trial of diet modification and reassessing.  No follow-ups on file.    Wolm Scarlet, MD     [1]  Allergies Allergen Reactions   Codeine Nausea Only   Prozac  [Fluoxetine  Hcl] Anxiety    Increased anxiety   "

## 2024-08-28 NOTE — Patient Instructions (Signed)
 A1C today 8.9%  Set up follow up with Endocrinology soon- if you decide to go back  Need follow up A1C in about 3 months.

## 2024-11-25 ENCOUNTER — Ambulatory Visit: Admitting: Family Medicine

## 2025-07-30 ENCOUNTER — Ambulatory Visit
